# Patient Record
Sex: Male | Born: 1940 | Race: White | Hispanic: No | Marital: Married | Smoking: Former smoker
Health system: Southern US, Community
[De-identification: ages and names within clinical notes are randomized; demographics above are authoritative.]

## PROBLEM LIST (undated history)

## (undated) ENCOUNTER — Ambulatory Visit

## (undated) ENCOUNTER — Ambulatory Visit: Admission: EM | Discharge status: Absent without leave | Payer: Medicare HMO

## (undated) DIAGNOSIS — J301 Allergic rhinitis due to pollen: Secondary | ICD-10-CM

## (undated) DIAGNOSIS — I499 Cardiac arrhythmia, unspecified: Secondary | ICD-10-CM

## (undated) DIAGNOSIS — E785 Hyperlipidemia, unspecified: Secondary | ICD-10-CM

## (undated) DIAGNOSIS — T7840XA Allergy, unspecified, initial encounter: Secondary | ICD-10-CM

## (undated) DIAGNOSIS — F410 Panic disorder [episodic paroxysmal anxiety] without agoraphobia: Secondary | ICD-10-CM

## (undated) DIAGNOSIS — I8393 Asymptomatic varicose veins of bilateral lower extremities: Secondary | ICD-10-CM

## (undated) DIAGNOSIS — K219 Gastro-esophageal reflux disease without esophagitis: Secondary | ICD-10-CM

## (undated) DIAGNOSIS — N529 Male erectile dysfunction, unspecified: Secondary | ICD-10-CM

## (undated) DIAGNOSIS — I4891 Unspecified atrial fibrillation: Secondary | ICD-10-CM

## (undated) DIAGNOSIS — I491 Atrial premature depolarization: Secondary | ICD-10-CM

## (undated) DIAGNOSIS — K279 Peptic ulcer, site unspecified, unspecified as acute or chronic, without hemorrhage or perforation: Secondary | ICD-10-CM

## (undated) DIAGNOSIS — C801 Malignant (primary) neoplasm, unspecified: Secondary | ICD-10-CM

## (undated) DIAGNOSIS — R079 Chest pain, unspecified: Secondary | ICD-10-CM

## (undated) DIAGNOSIS — H269 Unspecified cataract: Secondary | ICD-10-CM

## (undated) HISTORY — DX: Allergy, unspecified, initial encounter: T78.40XA

## (undated) HISTORY — DX: Allergic rhinitis due to pollen: J30.1

## (undated) HISTORY — DX: Unspecified cataract: H26.9

## (undated) HISTORY — DX: Panic disorder (episodic paroxysmal anxiety): F41.0

## (undated) HISTORY — DX: Atrial premature depolarization: I49.1

## (undated) HISTORY — DX: Hyperlipidemia, unspecified: E78.5

## (undated) HISTORY — DX: Peptic ulcer, site unspecified, unspecified as acute or chronic, without hemorrhage or perforation: K27.9

## (undated) HISTORY — PX: EYE SURGERY: SHX253

## (undated) HISTORY — PX: NASAL SEPTUM SURGERY: SHX37

## (undated) HISTORY — PX: HERNIA REPAIR: SHX51

## (undated) HISTORY — DX: Unspecified atrial fibrillation: I48.91

## (undated) HISTORY — DX: Malignant (primary) neoplasm, unspecified: C80.1

## (undated) HISTORY — DX: Asymptomatic varicose veins of bilateral lower extremities: I83.93

## (undated) HISTORY — DX: Male erectile dysfunction, unspecified: N52.9

## (undated) HISTORY — DX: Gastro-esophageal reflux disease without esophagitis: K21.9

## (undated) HISTORY — DX: Cardiac arrhythmia, unspecified: I49.9

## (undated) HISTORY — DX: Chest pain, unspecified: R07.9

---

## 2016-10-18 DIAGNOSIS — I4891 Unspecified atrial fibrillation: Secondary | ICD-10-CM | POA: Diagnosis not present

## 2016-10-18 DIAGNOSIS — R079 Chest pain, unspecified: Secondary | ICD-10-CM | POA: Diagnosis not present

## 2016-10-25 DIAGNOSIS — I4891 Unspecified atrial fibrillation: Secondary | ICD-10-CM

## 2016-10-25 DIAGNOSIS — R079 Chest pain, unspecified: Secondary | ICD-10-CM

## 2016-10-25 HISTORY — DX: Chest pain, unspecified: R07.9

## 2016-10-25 HISTORY — DX: Unspecified atrial fibrillation: I48.91

## 2016-10-26 ENCOUNTER — Institutional Professional Consult (permissible substitution): Payer: Self-pay | Admitting: Cardiology

## 2016-11-03 ENCOUNTER — Encounter: Payer: Self-pay | Admitting: Cardiology

## 2016-11-03 ENCOUNTER — Encounter (INDEPENDENT_AMBULATORY_CARE_PROVIDER_SITE_OTHER): Payer: Self-pay

## 2016-11-03 ENCOUNTER — Ambulatory Visit (INDEPENDENT_AMBULATORY_CARE_PROVIDER_SITE_OTHER): Payer: Medicare Other | Admitting: Cardiology

## 2016-11-03 VITALS — BP 120/78 | HR 55 | Ht 70.5 in | Wt 205.8 lb

## 2016-11-03 DIAGNOSIS — R079 Chest pain, unspecified: Secondary | ICD-10-CM | POA: Diagnosis not present

## 2016-11-03 DIAGNOSIS — R002 Palpitations: Secondary | ICD-10-CM

## 2016-11-03 MED ORDER — PROPRANOLOL HCL 20 MG PO TABS: 20.0000 mg | ORAL_TABLET | Freq: Two times a day (BID) | ORAL | 11 refills | Status: DC

## 2016-11-03 NOTE — Addendum Note (Signed)
Addended by: Stanton Kidney on: 11/03/2016 10:05 AM   Modules accepted: Orders

## 2016-11-03 NOTE — Progress Notes (Signed)
Electrophysiology Office Note   Date:  11/03/2016   ID:  Gerald Shelton, DOB [birth year], MRN [id number]  PCP:  Orpah Melter, MD  Primary Electrophysiologist:  Constance Haw, MD    Chief Complaint  Patient presents with  . Advice Only    AFib     History of Present Illness: Gerald Shelton is a 76 y.o. male who presents today for electrophysiology evaluation.   He has a history of hyperlipidemia. He presented to his primary physician's office with episodes of chest pain. The episodes last from 5 minutes to one hour. He says it is possible that his episodes are associated with exertion. He was working in his yard over the weekend and says that he had chest pain after work. At other times his chest discomfort is not associated with any exertion. He says that it is a dull ache in the right side of his chest. He says it feels like potentially a pulled muscle. He otherwise does get episodes of palpitations. They occur most often when he is waking up from sleep. He has a known history of APCs, but he feels like these palpitations are different. They last a few minutes at a time and are associated with rapid heart rate. His chest pain is not necessarily associated with shortness of breath.   Today, he denies symptoms of shortness of breath, orthopnea, PND, lower extremity edema, claudication, dizziness, presyncope, syncope, bleeding, or neurologic sequela. The patient is tolerating medications without difficulties and is otherwise without complaint today.    Past Medical History:  Diagnosis Date  . Anxiety attack   . Atrial premature complexes   . Cancer ([place])    SKIN, ON FACE  . ED (erectile dysfunction)   . GERD (gastroesophageal reflux disease)   . Hay fever   . Hyperlipidemia   . Irregular heartbeat   . PUD (peptic ulcer disease)    History reviewed. No pertinent surgical history.   Current Outpatient Prescriptions  Medication Sig Dispense Refill  . aspirin EC 81 MG tablet  Take 81 mg by mouth daily.    . Multiple Vitamins-Minerals (MULTI FOR HIM 50+) TABS Take by mouth.    Marland Kitchen omeprazole (PRILOSEC) 40 MG capsule Take 40 mg by mouth. EVERY FOUR DAYS    . propranolol (INDERAL) 20 MG tablet Take 1 tablet (20 mg total) by mouth 2 (two) times daily. 60 tablet 11   No current facility-administered medications for this visit.     Allergies:   Augmentin [amoxicillin-pot clavulanate] and Simvastatin   Social History:  The patient  reports that he quit smoking about 18 years ago. He has never used smokeless tobacco. He reports that he drinks alcohol. He reports that he does not use drugs.   Family History:  The patient's family history includes Dementia in his mother; Depression in his mother; Diabetes in his son; Parkinson's disease in his mother.    ROS:  Please see the history of present illness.   Otherwise, review of systems is positive for chest pain, palpitations, anxiety.   All other systems are reviewed and negative.    PHYSICAL EXAM: VS:  BP 120/78   Pulse (!) 55   Ht 5' 10.5" (1.791 m)   Wt 205 lb 12.8 oz (93.4 kg)   BMI 29.11 kg/m  , BMI Body mass index is 29.11 kg/m. GEN: Well nourished, well developed, in no acute distress  HEENT: normal  Neck: no JVD, carotid bruits, or masses Cardiac: iRRR; no murmurs, rubs,  or gallops,no edema  Respiratory:  clear to auscultation bilaterally, normal work of breathing GI: soft, nontender, nondistended, + BS MS: no deformity or atrophy  Skin: warm and dry Neuro:  Strength and sensation are intact Psych: euthymic mood, full affect  EKG:  EKG is ordered today. Personal review of the ekg ordered shows sinus rhythm, with APCs  Recent Labs: No results found for requested labs within last 8760 hours.    Lipid Panel  No results found for: CHOL, TRIG, HDL, CHOLHDL, VLDL, LDLCALC, LDLDIRECT   Wt Readings from Last 3 Encounters:  11/03/16 205 lb 12.8 oz (93.4 kg)      Other studies Reviewed: Additional  studies/ records that were reviewed today include: PCP notes   ASSESSMENT AND PLAN:  1.  Chest pain: Feeling well today, but does have episodes of chest pain. It is unclear whether or not this is due to coronary disease. Due to that, we'll order a rest stress Myoview to further determine if he does have episodes of coronary artery disease.  2. 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 EKGs that are available today show sinus rhythm with APCs, and a EKG of sinus rhythm from his primary physician. Due to his episodic palpitations, we'll order a 48 hour monitor.  Current medicines are reviewed at length with the patient today.   The patient does not have concerns regarding his medicines.  The following changes were made today:  none  Labs/ tests ordered today include:  Orders Placed This Encounter  Procedures  . Myocardial Perfusion Imaging  . Holter monitor - 48 hour  . EKG 12-Lead     Disposition:   FU with Innocence Schlotzhauer 1 months  Signed, Breauna Mazzeo Meredith Leeds, MD  11/03/2016 9:58 AM     [facility] [address] [place] [address] [phone] (office) [phone] (fax)

## 2016-11-03 NOTE — Patient Instructions (Addendum)
Medication Instructions:   Your physician has recommended you make the following change in your medication:  1) INCREASE Propranolol 20 mg twice a day  --- If you need a refill on your cardiac medications before your next appointment, please call your pharmacy. ---  Labwork:  None ordered  Testing/Procedures: Your physician has recommended that you wear a 48 hour holter monitor. Holter monitors are medical devices that record the heart's electrical activity. Doctors most often use these monitors to diagnose arrhythmias. Arrhythmias are problems with the speed or rhythm of the heartbeat. The monitor is a small, portable device. You can wear one while you do your normal daily activities. This is usually used to diagnose what is causing palpitations/syncope (passing out).  Your physician has requested that you have an exercise tolerance test. For further information please visit [email or web address]. Please also follow instruction sheet, as given.  Follow-Up:  Your physician recommends that you schedule a follow-up appointment in: 1 month with Dr. Curt Bears  Thank you for choosing [facility]!!   Trinidad Curet, RN [phone]  Any Other Special Instructions Will Be Listed Below (If Applicable).  Propranolol tablets What is this medicine? PROPRANOLOL (proe PRAN oh lole) is a beta-blocker. Beta-blockers reduce the workload on the heart and help it to beat more regularly. This medicine is used to treat high blood pressure, to control irregular heart rhythms (arrhythmias) and to relieve chest pain caused by angina. It may also be helpful after a heart attack. This medicine is also used to prevent migraine headaches, relieve uncontrollable shaking (tremors), and help certain problems related to the thyroid gland and adrenal gland. This medicine may be used for other purposes; ask your health care provider or pharmacist if you have questions. COMMON BRAND NAME(S): Inderal What should I tell  my health care provider before I take this medicine? They need to know if you have any of these conditions: -circulation problems or blood vessel disease -diabetes -history of heart attack or heart disease, vasospastic angina -kidney disease -liver disease -lung or breathing disease, like asthma or emphysema -pheochromocytoma -slow heart rate -thyroid disease -an unusual or allergic reaction to propranolol, other beta-blockers, medicines, foods, dyes, or preservatives -pregnant or trying to get pregnant -breast-feeding How should I use this medicine? Take this medicine by mouth with a glass of water. Follow the directions on the prescription label. Take your doses at regular intervals. Do not take your medicine more often than directed. Do not stop taking except on your the advice of your doctor or health care professional. Talk to your pediatrician regarding the use of this medicine in children. Special care may be needed. Overdosage: If you think you have taken too much of this medicine contact a poison control center or emergency room at once. NOTE: This medicine is only for you. Do not share this medicine with others. What if I miss a dose? If you miss a dose, take it as soon as you can. If it is almost time for your next dose, take only that dose. Do not take double or extra doses. What may interact with this medicine? Do not take this medicine with any of the following medications: -feverfew -phenothiazines like chlorpromazine, mesoridazine, prochlorperazine, thioridazine This medicine may also interact with the following medications: -aluminum hydroxide gel -antipyrine -antiviral medicines for HIV or AIDS -barbiturates like phenobarbital -certain medicines for blood pressure, heart disease, irregular heart beat -cimetidine -ciprofloxacin -diazepam -fluconazole -haloperidol -isoniazid -medicines for cholesterol like cholestyramine or colestipol -medicines for mental  depression -medicines for migraine headache like almotriptan, eletriptan, frovatriptan, naratriptan, rizatriptan, sumatriptan, zolmitriptan -NSAIDs, medicines for pain and inflammation, like ibuprofen or naproxen -phenytoin -rifampin -teniposide -theophylline -thyroid medicines -tolbutamide -warfarin -zileuton This list may not describe all possible interactions. Give your health care provider a list of all the medicines, herbs, non-prescription drugs, or dietary supplements you use. Also tell them if you smoke, drink alcohol, or use illegal drugs. Some items may interact with your medicine. What should I watch for while using this medicine? Visit your doctor or health care professional for regular check ups. Check your blood pressure and pulse rate regularly. Ask your health care professional what your blood pressure and pulse rate should be, and when you should contact them. You may get drowsy or dizzy. Do not drive, use machinery, or do anything that needs mental alertness until you know how this drug affects you. Do not stand or sit up quickly, especially if you are an older patient. This reduces the risk of dizzy or fainting spells. Alcohol can make you more drowsy and dizzy. Avoid alcoholic drinks. This medicine can affect blood sugar levels. If you have diabetes, check with your doctor or health care professional before you change your diet or the dose of your diabetic medicine. Do not treat yourself for coughs, colds, or pain while you are taking this medicine without asking your doctor or health care professional for advice. Some ingredients may increase your blood pressure. What side effects may I notice from receiving this medicine? Side effects that you should report to your doctor or health care professional as soon as possible: -allergic reactions like skin rash, itching or hives, swelling of the face, lips, or tongue -breathing problems -changes in blood sugar -cold hands or  feet -difficulty sleeping, nightmares -dry peeling skin -hallucinations -muscle cramps or weakness -slow heart rate -swelling of the legs and ankles -vomiting Side effects that usually do not require medical attention (report to your doctor or health care professional if they continue or are bothersome): -change in sex drive or performance -diarrhea -dry sore eyes -hair loss -nausea -weak or tired This list may not describe all possible side effects. Call your doctor for medical advice about side effects. You may report side effects to FDA at 1-800-FDA-1088. Where should I keep my medicine? Keep out of the reach of children. Store at room temperature between 15 and 30 degrees C (59 and 86 degrees F). Protect from light. Throw away any unused medicine after the expiration date. NOTE: This sheet is a summary. It may not cover all possible information. If you have questions about this medicine, talk to your doctor, pharmacist, or health care provider.  2017 Elsevier/Gold Standard (2013-05-31 14:51:53)   Pharmacologic Stress Electrocardiogram A pharmacologic stress electrocardiogram is a heart (cardiac) test that uses nuclear imaging to evaluate the blood supply to your heart. This test may also be called a pharmacologic stress electrocardiography. Pharmacologic means that a medicine is used to increase your heart rate and blood pressure.  This stress test is done to find areas of poor blood flow to the heart by determining the extent of coronary artery disease (CAD). Some people exercise on a treadmill, which naturally increases the blood flow to the heart. For those people unable to exercise on a treadmill, a medicine is used. This medicine stimulates your heart and will cause your heart to beat harder and more quickly, as if you were exercising.  Pharmacologic stress tests can help determine:  The adequacy of  blood flow to your heart during increased levels of activity in order to clear  you for discharge home.  The extent of coronary artery blockage caused by CAD.  Your prognosis if you have suffered a heart attack.  The effectiveness of cardiac procedures done, such as an angioplasty, which can increase the circulation in your coronary arteries.  Causes of chest pain or pressure. LET [facility] CARE PROVIDER KNOW ABOUT:  Any allergies you have.  All medicines you are taking, including vitamins, herbs, eye drops, creams, and over-the-counter medicines.  Previous problems you or members of your family have had with the use of anesthetics.  Any blood disorders you have.  Previous surgeries you have had.  Medical conditions you have.  Possibility of pregnancy, if this applies.  If you are currently breastfeeding. RISKS AND COMPLICATIONS Generally, this is a safe procedure. However, as with any procedure, complications can occur. Possible complications include:  You develop pain or pressure in the following areas:  Chest.  Jaw or neck.  Between your shoulder blades.  Radiating down your left arm.  Headache.  Dizziness or light-headedness.  Shortness of breath.  Increased or irregular heartbeat.  Low blood pressure.  Nausea or vomiting.  Flushing.  Redness going up the arm and slight pain during injection of medicine.  Heart attack (rare). BEFORE THE PROCEDURE   Avoid all forms of caffeine for 24 hours before your test or as directed by your health care provider. This includes coffee, tea (even decaffeinated tea), caffeinated sodas, chocolate, cocoa, and certain pain medicines.  Follow your health care provider's instructions regarding eating and drinking before the test.  Take your medicines as directed at regular times with water unless instructed otherwise. Exceptions may include:  If you have diabetes, ask how you are to take your insulin or pills. It is common to adjust insulin dosing the morning of the test.  If you are taking  beta-blocker medicines, it is important to talk to your health care provider about these medicines well before the date of your test. Taking beta-blocker medicines may interfere with the test. In some cases, these medicines need to be changed or stopped 24 hours or more before the test.  If you wear a nitroglycerin patch, it may need to be removed prior to the test. Ask your health care provider if the patch should be removed before the test.  If you use an inhaler for any breathing condition, bring it with you to the test.  If you are an outpatient, bring a snack so you can eat right after the stress phase of the test.  Do not smoke for 4 hours prior to the test or as directed by your health care provider.  Do not apply lotions, powders, creams, or oils on your chest prior to the test.  Wear comfortable shoes and clothing. Let your health care provider know if you were unable to complete or follow the preparations for your test. PROCEDURE   Multiple patches (electrodes) will be put on your chest. If needed, small areas of your chest may be shaved to get better contact with the electrodes. Once the electrodes are attached to your body, multiple wires will be attached to the electrodes, and your heart rate will be monitored.  An IV access will be started. A nuclear trace (isotope) is given. The isotope may be given intravenously, or it may be swallowed. Nuclear refers to several types of radioactive isotopes, and the nuclear isotope lights up the arteries  so that the nuclear images are clear. The isotope is absorbed by your body. This results in low radiation exposure.  A resting nuclear image is taken to show how your heart functions at rest.  A medicine is given through the IV access.  A second scan is done about 1 hour after the medicine injection and determines how your heart functions under stress.  During this stress phase, you will be connected to an electrocardiogram machine. Your  blood pressure and oxygen levels will be monitored. AFTER THE PROCEDURE   Your heart rate and blood pressure will be monitored after the test.  You may return to your normal schedule, including diet,activities, and medicines, unless your health care provider tells you otherwise. This information is not intended to replace advice given to you by your health care provider. Make sure you discuss any questions you have with your health care provider. Document Released: 02/12/2009 Document Revised: 10/01/2013 Document Reviewed: 06/03/2013 Elsevier Interactive Patient Education  2017 [employer].

## 2016-11-09 ENCOUNTER — Ambulatory Visit (INDEPENDENT_AMBULATORY_CARE_PROVIDER_SITE_OTHER): Payer: Medicare Other

## 2016-11-09 ENCOUNTER — Ambulatory Visit: Payer: Medicare Other

## 2016-11-09 ENCOUNTER — Telehealth: Payer: Self-pay | Admitting: *Deleted

## 2016-11-09 DIAGNOSIS — R079 Chest pain, unspecified: Secondary | ICD-10-CM

## 2016-11-09 DIAGNOSIS — R002 Palpitations: Secondary | ICD-10-CM | POA: Diagnosis not present

## 2016-11-09 NOTE — Telephone Encounter (Signed)
Patient seen on 1/25 - holter monitor (palpitations) and myoview (chest pain) ordered.   However, GXT ordered/scheduled, not a myoview.   Pt coming in today for holter monitor -- Gerald Shelton, treadmill room, will have patient schedule myoview testing before leaving today.

## 2016-11-10 ENCOUNTER — Telehealth (HOSPITAL_COMMUNITY): Payer: Self-pay | Admitting: *Deleted

## 2016-11-10 ENCOUNTER — Encounter (HOSPITAL_COMMUNITY): Payer: Medicare Other

## 2016-11-10 NOTE — Telephone Encounter (Signed)
Patient given detailed instructions per Myocardial Perfusion Study Information Sheet for the test on 11/14/16. Patient notified to arrive 15 minutes early and that it is imperative to arrive on time for appointment to keep from having the test rescheduled.  If you need to cancel or reschedule your appointment, please call the office within 24 hours of your appointment. Failure to do so may result in a cancellation of your appointment, and a $50 no show fee. Patient verbalized understanding. Kirstie Peri

## 2016-11-14 ENCOUNTER — Ambulatory Visit (HOSPITAL_COMMUNITY): Payer: Medicare Other | Attending: Cardiology

## 2016-11-14 DIAGNOSIS — R079 Chest pain, unspecified: Secondary | ICD-10-CM | POA: Diagnosis not present

## 2016-11-14 LAB — MYOCARDIAL PERFUSION IMAGING
CHL CUP NUCLEAR SSS: 2
LV sys vol: 39 mL
LVDIAVOL: 92 mL (ref 62–150)
Peak HR: 85 {beats}/min
RATE: 0.35
Rest HR: 57 {beats}/min
SDS: 1
SRS: 1
TID: 0.89

## 2016-11-14 MED ORDER — TECHNETIUM TC 99M TETROFOSMIN IV KIT
33.0000 | PACK | Freq: Once | INTRAVENOUS | Status: AC | PRN
  Administered 2016-11-14: 33 via INTRAVENOUS
  Filled 2016-11-14: qty 33

## 2016-11-14 MED ORDER — TECHNETIUM TC 99M TETROFOSMIN IV KIT
10.8000 | PACK | Freq: Once | INTRAVENOUS | Status: AC | PRN
  Administered 2016-11-14: 10.8 via INTRAVENOUS
  Filled 2016-11-14: qty 11

## 2016-11-14 MED ORDER — REGADENOSON 0.4 MG/5ML IV SOLN
0.4000 mg | Freq: Once | INTRAVENOUS | Status: AC
  Administered 2016-11-14: 0.4 mg via INTRAVENOUS

## 2016-11-29 ENCOUNTER — Ambulatory Visit (INDEPENDENT_AMBULATORY_CARE_PROVIDER_SITE_OTHER): Payer: Medicare Other | Admitting: Cardiology

## 2016-11-29 ENCOUNTER — Encounter: Payer: Self-pay | Admitting: Cardiology

## 2016-11-29 VITALS — BP 120/76 | HR 68 | Ht 70.5 in | Wt 205.2 lb

## 2016-11-29 DIAGNOSIS — R002 Palpitations: Secondary | ICD-10-CM | POA: Diagnosis not present

## 2016-11-29 DIAGNOSIS — I491 Atrial premature depolarization: Secondary | ICD-10-CM

## 2016-11-29 MED ORDER — PROPRANOLOL HCL 20 MG PO TABS: 20.0000 mg | ORAL_TABLET | Freq: Two times a day (BID) | ORAL | 6 refills | Status: DC

## 2016-11-29 NOTE — Progress Notes (Signed)
Electrophysiology Office Note   Date:  11/29/2016   ID:  Gerald Shelton, DOB 1941-08-24, MRN [id number]  PCP:  Orpah Melter, MD  Primary Electrophysiologist:  Constance Haw, MD    Chief Complaint  Patient presents with  . Follow-up    Palpitations     History of Present Illness: Gerald Shelton is a 76 y.o. male who presents today for electrophysiology evaluation.   He has a history of hyperlipidemia. He initially presented to the office after an episode of chest pain as well as palpitations. He had a nuclear stress test that was low risk with a normal ejection fraction. He wore a 48 hour monitor that showed 5% APCs but no other arrhythmia. He says that he has continued to have the APCs, but they are not significantly worrisome. He also continues to have his same chest pain. The pain is in both the left and right side of his chest, depending on the episode. He says that it is not associated with exertion.   Today, he denies symptoms of shortness of breath, orthopnea, PND, lower extremity edema, claudication, dizziness, presyncope, syncope, bleeding, or neurologic sequela. The patient is tolerating medications without difficulties and is otherwise without complaint today.    Past Medical History:  Diagnosis Date  . Anxiety attack   . Atrial premature complexes   . Cancer ([place])    SKIN, ON FACE  . ED (erectile dysfunction)   . GERD (gastroesophageal reflux disease)   . Hay fever   . Hyperlipidemia   . Irregular heartbeat   . PUD (peptic ulcer disease)    No past surgical history on file.   Current Outpatient Prescriptions  Medication Sig Dispense Refill  . aspirin EC 81 MG tablet Take 81 mg by mouth daily.    . Multiple Vitamins-Minerals (MULTI FOR HIM 50+) TABS Take by mouth.    Marland Kitchen omeprazole (PRILOSEC) 40 MG capsule Take 40 mg by mouth. EVERY FOUR DAYS    . propranolol (INDERAL) 20 MG tablet Take 1 tablet (20 mg total) by mouth 2 (two) times daily. 60 tablet 6   No  current facility-administered medications for this visit.     Allergies:   Augmentin [amoxicillin-pot clavulanate] and Simvastatin   Social History:  The patient  reports that he quit smoking about 18 years ago. He has never used smokeless tobacco. He reports that he drinks alcohol. He reports that he does not use drugs.   Family History:  The patient's family history includes Dementia in his mother; Depression in his mother; Diabetes in his son; Parkinson's disease in his mother.    ROS:  Please see the history of present illness.   Otherwise, review of systems is positive for chest pain, palpitations.   All other systems are reviewed and negative.    PHYSICAL EXAM: VS:  BP 120/76   Pulse 68   Ht 5' 10.5" (1.791 m)   Wt 205 lb 3.2 oz (93.1 kg)   BMI 29.03 kg/m  , BMI Body mass index is 29.03 kg/m. GEN: Well nourished, well developed, in no acute distress  HEENT: normal  Neck: no JVD, carotid bruits, or masses Cardiac: RRR; no murmurs, rubs, or gallops,no edema  Respiratory:  clear to auscultation bilaterally, normal work of breathing GI: soft, nontender, nondistended, + BS MS: no deformity or atrophy  Skin: warm and dry Neuro:  Strength and sensation are intact Psych: euthymic mood, full affect  EKG:  EKG is not ordered today. Personal review of  the ekg ordered 11/03/16 shows sinus rhythm, with APCs  Recent Labs: No results found for requested labs within last 8760 hours.    Lipid Panel  No results found for: CHOL, TRIG, HDL, CHOLHDL, VLDL, LDLCALC, LDLDIRECT   Wt Readings from Last 3 Encounters:  11/29/16 205 lb 3.2 oz (93.1 kg)  11/14/16 205 lb (93 kg)  11/03/16 205 lb 12.8 oz (93.4 kg)      Other studies Reviewed: Additional studies/ records that were reviewed today include: Myoview 11/14/16  Nuclear stress EF: 57%.  There was no ST segment deviation noted during stress.  The study is normal.  This is a low risk study.  The left ventricular ejection  fraction is normal (55-65%).   Normal pharmacologic nuclear stress test with no evidence for prior infarct or ischemia.  Holter 11/09/16 Minimum HR: 40 BPM at 11:54:42 AM Maximum HR: 101 BPM at 5:09:43 PM(2) Average HR: 62 BPM Rare PVCs 5.14% APCs Symptoms of rapid heart beats associated with sinus rhythm, rates 55-85 and occasional APCs   ASSESSMENT AND PLAN:  1.  Chest pain: No current chest pain. Had a nuclear stress test that showed no evidence of ischemia or infarction and was low risk. It is unlikely, therefore, that his chest pain is due to cardiac causes. He does have a history of GERD, and is unclear whether or not he could have esophageal spasm.  2. Palpitations: Found to have up to 5% APCs on his cardiac monitor. He is on propranolol. I have discussed with him that these are not necessarily dangerous and they do not pose him risk of heart attack, stroke, or sudden death. He feels well with the reassurance and Gerald Shelton continue his propranolol.  Current medicines are reviewed at length with the patient today.   The patient does not have concerns regarding his medicines.  The following changes were made today:  none  Labs/ tests ordered today include:  No orders of the defined types were placed in this encounter.    Disposition:   FU with Belina Mandile 6  months  Signed, Rashonda Warrior Meredith Leeds, MD  11/29/2016 10:53 AM     [facility] [address] [place] [address] [phone] (office) [phone] (fax)

## 2016-11-29 NOTE — Patient Instructions (Addendum)
Medication Instructions:  Your physician recommends that you continue on your current medications as directed. Please refer to the Current Medication list given to you today.   Labwork: None Ordered   Testing/Procedures: None Ordered   Follow-Up: Your physician wants you to follow-up in: 6 month with Dr. Curt Bears. You will receive a reminder letter in the mail two months in advance. If you don't receive a letter, please call our office to schedule the follow-up appointment.     Any Other Special Instructions Will Be Listed Below (If Applicable).     If you need a refill on your cardiac medications before your next appointment, please call your pharmacy.

## 2016-12-13 ENCOUNTER — Other Ambulatory Visit: Payer: Self-pay | Admitting: Cardiology

## 2016-12-13 MED ORDER — PROPRANOLOL HCL 20 MG PO TABS: 20.0000 mg | ORAL_TABLET | Freq: Two times a day (BID) | ORAL | 3 refills | Status: DC

## 2016-12-29 DIAGNOSIS — D225 Melanocytic nevi of trunk: Secondary | ICD-10-CM | POA: Diagnosis not present

## 2016-12-29 DIAGNOSIS — L814 Other melanin hyperpigmentation: Secondary | ICD-10-CM | POA: Diagnosis not present

## 2016-12-29 DIAGNOSIS — L821 Other seborrheic keratosis: Secondary | ICD-10-CM | POA: Diagnosis not present

## 2016-12-29 DIAGNOSIS — D1801 Hemangioma of skin and subcutaneous tissue: Secondary | ICD-10-CM | POA: Diagnosis not present

## 2016-12-29 DIAGNOSIS — L570 Actinic keratosis: Secondary | ICD-10-CM | POA: Diagnosis not present

## 2017-02-28 ENCOUNTER — Telehealth: Payer: Self-pay | Admitting: Cardiology

## 2017-02-28 NOTE — Telephone Encounter (Signed)
New Message     Pt had a heart monitor done earlier this winter and he would like Dr Curt Bears to review it , he thinks something was missed. There was specific times when things were happening when he was wearing the monitor that he would like to discuss with Dr Curt Bears

## 2017-02-28 NOTE — Telephone Encounter (Signed)
Patient called to request Dr. Curt Bears take another looks at his holter results from [place].  He states when he wakes up (either during the night or in the morning), he feels like his heart is racing like it did when he had the monitor. This is not new, he would just like an explanation. No symptoms accompany the fast heart rate and the episodes last about 10-15 minutes.  He does not have follow-up until August and would like possible explanations before that time.   To Dr. Curt Bears for review.

## 2017-03-01 NOTE — Telephone Encounter (Signed)
Sinus rhythm with APCs seen, 5.3%. No other arrhythmia. If continued symptoms, can return to clinic to discuss management of APCs.

## 2017-03-02 NOTE — Telephone Encounter (Signed)
Informed pt of Dr. Curt Bears response.  We discussed APCs and why he may be feeling them more in morning/evening w/ rest.  Pt is not concerned, denies symptoms at this time.  He thanks me for calling and discussing this with him and explaining APCs in more detail. Pt will call office if he feels he needs to be seen sooner than scheduled f/u in August.

## 2017-04-24 DIAGNOSIS — R202 Paresthesia of skin: Secondary | ICD-10-CM | POA: Diagnosis not present

## 2017-04-24 DIAGNOSIS — R7303 Prediabetes: Secondary | ICD-10-CM | POA: Diagnosis not present

## 2017-04-24 DIAGNOSIS — M79605 Pain in left leg: Secondary | ICD-10-CM | POA: Diagnosis not present

## 2017-04-24 DIAGNOSIS — R200 Anesthesia of skin: Secondary | ICD-10-CM | POA: Diagnosis not present

## 2017-05-01 DIAGNOSIS — M21962 Unspecified acquired deformity of left lower leg: Secondary | ICD-10-CM | POA: Diagnosis not present

## 2017-05-01 DIAGNOSIS — M21961 Unspecified acquired deformity of right lower leg: Secondary | ICD-10-CM | POA: Diagnosis not present

## 2017-05-01 DIAGNOSIS — M7752 Other enthesopathy of left foot: Secondary | ICD-10-CM | POA: Diagnosis not present

## 2017-05-01 DIAGNOSIS — M205X2 Other deformities of toe(s) (acquired), left foot: Secondary | ICD-10-CM | POA: Diagnosis not present

## 2017-05-03 DIAGNOSIS — M5431 Sciatica, right side: Secondary | ICD-10-CM | POA: Diagnosis not present

## 2017-05-03 DIAGNOSIS — G8929 Other chronic pain: Secondary | ICD-10-CM | POA: Diagnosis not present

## 2017-05-03 DIAGNOSIS — M5441 Lumbago with sciatica, right side: Secondary | ICD-10-CM | POA: Diagnosis not present

## 2017-05-03 DIAGNOSIS — M5442 Lumbago with sciatica, left side: Secondary | ICD-10-CM | POA: Diagnosis not present

## 2017-05-03 DIAGNOSIS — M5432 Sciatica, left side: Secondary | ICD-10-CM | POA: Diagnosis not present

## 2017-05-15 ENCOUNTER — Encounter: Payer: Self-pay | Admitting: Cardiology

## 2017-05-31 ENCOUNTER — Encounter: Payer: Self-pay | Admitting: Cardiology

## 2017-05-31 ENCOUNTER — Ambulatory Visit (INDEPENDENT_AMBULATORY_CARE_PROVIDER_SITE_OTHER): Payer: Medicare Other | Admitting: Cardiology

## 2017-05-31 VITALS — BP 130/84 | HR 58 | Ht 70.5 in | Wt 201.6 lb

## 2017-05-31 DIAGNOSIS — I491 Atrial premature depolarization: Secondary | ICD-10-CM | POA: Diagnosis not present

## 2017-05-31 NOTE — Addendum Note (Signed)
Addended by: Stanton Kidney on: 05/31/2017 10:04 AM   Modules accepted: Orders

## 2017-05-31 NOTE — Patient Instructions (Signed)
Medication Instructions:  Your physician has recommended you make the following change in your medication:  1. STOP Propranolol  If you need a refill on your cardiac medications before your next appointment, please call your pharmacy.   Labwork: None ordered  Testing/Procedures: None ordered  Follow-Up: Your physician wants you to follow-up in: 1 year with Dr. Curt Bears.  You will receive a reminder letter in the mail two months in advance. If you don't receive a letter, please call our office to schedule the follow-up appointment.  Thank you for choosing [facility]!!   Trinidad Curet, RN [phone]  Any Other Special Instructions Will Be Listed Below (If Applicable).

## 2017-05-31 NOTE — Progress Notes (Signed)
Electrophysiology Office Note   Date:  05/31/2017   ID:  Syncere, Eble [birth year], MRN [id number]  PCP:  Glenford Bayley, DO  Primary Electrophysiologist:  Constance Haw, MD    Chief Complaint  Patient presents with  . Follow-up    Palpitations/Atrial premature contractions     History of Present Illness: Gerald Shelton is a 76 y.o. male who presents today for electrophysiology evaluation.   He has a history of hyperlipidemia. He initially presented to the office after an episode of chest pain as well as palpitations. He had a nuclear stress test that was low risk with a normal ejection fraction. He wore a 48 hour monitor that showed 5% APCs but no other arrhythmia. He says that he has continued to have the APCs, but they are not significantly worrisome. He also continues to have his same chest pain. The pain is in both the left and right side of his chest, depending on the episode. He says that it is not associated with exertion.   Today, denies symptoms of chest pain, shortness of breath, orthopnea, PND, lower extremity edema, claudication, dizziness, presyncope, syncope, bleeding, or neurologic sequela. The patient is tolerating medications without difficulties and is otherwise without complaint today.  He is continuing to have episodes of palpitations. He does not feel like the propranolol has made a difference. His palpitations mainly occur when he wakes up first thing in the morning. He is not particularly bothered by his palpitations. He wishes to stop the propranolol.   Past Medical History:  Diagnosis Date  . Anxiety attack   . Atrial premature complexes   . Cancer ([place])    SKIN, ON FACE  . ED (erectile dysfunction)   . GERD (gastroesophageal reflux disease)   . Hay fever   . Hyperlipidemia   . Irregular heartbeat   . PUD (peptic ulcer disease)    No past surgical history on file.   Current Outpatient Prescriptions  Medication Sig Dispense Refill  . Multiple  Vitamins-Minerals (MULTI FOR HIM 50+) TABS Take by mouth.    Marland Kitchen omeprazole (PRILOSEC) 40 MG capsule Take 40 mg by mouth every other day.     . propranolol (INDERAL) 20 MG tablet Take 1 tablet (20 mg total) by mouth 2 (two) times daily. 180 tablet 3   No current facility-administered medications for this visit.     Allergies:   Augmentin [amoxicillin-pot clavulanate] and Simvastatin   Social History:  The patient  reports that he quit smoking about 18 years ago. He has never used smokeless tobacco. He reports that he drinks alcohol. He reports that he does not use drugs.   Family History:  The patient's family history includes Dementia in his mother; Depression in his mother; Diabetes in his son; Parkinson's disease in his mother.    ROS:  Please see the history of present illness.   Otherwise, review of systems is positive for palpitations.   All other systems are reviewed and negative.   PHYSICAL EXAM: VS:  BP 130/84   Pulse (!) 58   Ht 5' 10.5" (1.791 m)   Wt 201 lb 9.6 oz (91.4 kg)   BMI 28.52 kg/m  , BMI Body mass index is 28.52 kg/m. GEN: Well nourished, well developed, in no acute distress  HEENT: normal  Neck: no JVD, carotid bruits, or masses Cardiac: iRRR; no murmurs, rubs, or gallops,no edema  Respiratory:  clear to auscultation bilaterally, normal work of breathing GI: soft, nontender, nondistended, +  BS MS: no deformity or atrophy  Skin: warm and dry Neuro:  Strength and sensation are intact Psych: euthymic mood, full affect  EKG:  EKG is ordered today. Personal review of the ekg ordered shows sinus rhythm, APCs, inferior Q waves   Recent Labs: No results found for requested labs within last 8760 hours.    Lipid Panel  No results found for: CHOL, TRIG, HDL, CHOLHDL, VLDL, LDLCALC, LDLDIRECT   Wt Readings from Last 3 Encounters:  05/31/17 201 lb 9.6 oz (91.4 kg)  11/29/16 205 lb 3.2 oz (93.1 kg)  11/14/16 205 lb (93 kg)      Other studies  Reviewed: Additional studies/ records that were reviewed today include: Myoview 11/14/16  Nuclear stress EF: 57%.  There was no ST segment deviation noted during stress.  The study is normal.  This is a low risk study.  The left ventricular ejection fraction is normal (55-65%).   Normal pharmacologic nuclear stress test with no evidence for prior infarct or ischemia.  Holter 11/09/16 Minimum HR: 40 BPM at 11:54:42 AM Maximum HR: 101 BPM at 5:09:43 PM(2) Average HR: 62 BPM Rare PVCs 5.14% APCs Symptoms of rapid heart beats associated with sinus rhythm, rates 55-85 and occasional APCs   ASSESSMENT AND PLAN:  1.  Chest pain: Chest pain is gone. Nuclear stress test without evidence of ischemia. No further workup.  2. Palpitations: Found to have 5% APCs on his most recent monitor. He was taking propranolol, but this has not helped with his symptoms. He understands that this is not put him at risk of heart attack, stroke, or syncope. He would like to try being off the propranolol to see if this makes a difference.  Current medicines are reviewed at length with the patient today.   The patient does not have concerns regarding his medicines.  The following changes were made today:  Stop propranolol  Labs/ tests ordered today include:  Orders Placed This Encounter  Procedures  . EKG 12-Lead     Disposition:   FU with Will Camnitz 12  months  Signed, Will Meredith Leeds, MD  05/31/2017 10:01 AM     [facility] [address] [place] [address] [phone] (office) [phone] (fax)

## 2017-06-01 DIAGNOSIS — Z0000 Encounter for general adult medical examination without abnormal findings: Secondary | ICD-10-CM | POA: Diagnosis not present

## 2017-06-01 DIAGNOSIS — M79671 Pain in right foot: Secondary | ICD-10-CM | POA: Diagnosis not present

## 2017-06-01 DIAGNOSIS — M79605 Pain in left leg: Secondary | ICD-10-CM | POA: Diagnosis not present

## 2017-06-01 DIAGNOSIS — E782 Mixed hyperlipidemia: Secondary | ICD-10-CM | POA: Diagnosis not present

## 2017-06-01 DIAGNOSIS — M79672 Pain in left foot: Secondary | ICD-10-CM | POA: Diagnosis not present

## 2017-06-01 DIAGNOSIS — R7303 Prediabetes: Secondary | ICD-10-CM | POA: Diagnosis not present

## 2017-06-01 DIAGNOSIS — K219 Gastro-esophageal reflux disease without esophagitis: Secondary | ICD-10-CM | POA: Diagnosis not present

## 2017-06-01 DIAGNOSIS — I491 Atrial premature depolarization: Secondary | ICD-10-CM | POA: Diagnosis not present

## 2017-06-01 DIAGNOSIS — M79604 Pain in right leg: Secondary | ICD-10-CM | POA: Diagnosis not present

## 2017-06-09 ENCOUNTER — Other Ambulatory Visit: Payer: Self-pay | Admitting: Family Medicine

## 2017-06-09 DIAGNOSIS — M79605 Pain in left leg: Secondary | ICD-10-CM

## 2017-06-13 ENCOUNTER — Ambulatory Visit (HOSPITAL_COMMUNITY)
Admission: RE | Admit: 2017-06-13 | Discharge: 2017-06-13 | Disposition: A | Discharge status: Home / Self Care | Payer: Medicare Other | Source: Ambulatory Visit | Attending: Vascular Surgery | Admitting: Vascular Surgery

## 2017-06-13 DIAGNOSIS — M79605 Pain in left leg: Secondary | ICD-10-CM

## 2017-06-14 ENCOUNTER — Ambulatory Visit (HOSPITAL_COMMUNITY)
Admission: RE | Admit: 2017-06-14 | Discharge: 2017-06-14 | Disposition: A | Discharge status: Home / Self Care | Payer: Medicare Other | Source: Ambulatory Visit | Attending: Vascular Surgery | Admitting: Vascular Surgery

## 2017-06-14 ENCOUNTER — Other Ambulatory Visit: Payer: Self-pay | Admitting: Family Medicine

## 2017-06-14 DIAGNOSIS — M79605 Pain in left leg: Secondary | ICD-10-CM | POA: Diagnosis not present

## 2017-06-26 ENCOUNTER — Encounter: Payer: Self-pay | Admitting: Vascular Surgery

## 2017-06-26 ENCOUNTER — Ambulatory Visit (INDEPENDENT_AMBULATORY_CARE_PROVIDER_SITE_OTHER): Payer: Medicare Other | Admitting: Vascular Surgery

## 2017-06-26 VITALS — BP 124/70 | HR 68 | Temp 97.6°F | Resp 16 | Ht 70.5 in | Wt 199.0 lb

## 2017-06-26 DIAGNOSIS — I8393 Asymptomatic varicose veins of bilateral lower extremities: Secondary | ICD-10-CM

## 2017-06-26 HISTORY — DX: Asymptomatic varicose veins of bilateral lower extremities: I83.93

## 2017-06-26 NOTE — Progress Notes (Signed)
Subjective:     Patient ID: Gerald Shelton, male   DOB: [birth year], 76 y.o.   MRN: [id number]  HPI Th for evaluation of possible venous disease. The patient's symptoms are primarily in both feet which consist of numbness and a tight sensation on the soles of his feet. He's had this for many years. He has no history of DVT, phlebitis stasis ulcers or bleeding. He denies swelling does not elastic compression stockings. He has seen an orthopedic surgeon in the past and has also seen a neurologist but I'm not certain exactly when those consultations occurred. He saw a podiatrist for a different type of problem in his left foot which is now resolved. His symptoms are present in all day and worsened by walking. He is able to ambulate 1 mile.   Past Medical History:  Diagnosis Date  . Anxiety attack   . Atrial premature complexes   . Cancer ([place])    SKIN, ON FACE  . ED (erectile dysfunction)   . GERD (gastroesophageal reflux disease)   . Hay fever   . Hyperlipidemia   . Irregular heartbeat   . PUD (peptic ulcer disease)     Social History  Substance Use Topics  . Smoking status: Former Smoker    Quit date: 10/25/1998  . Smokeless tobacco: Never Used  . Alcohol use Yes     Comment: RARE    Family History  Problem Relation Age of Onset  . Diabetes Son   . Parkinson's disease Mother   . Depression Mother   . Dementia Mother     Allergies  Allergen Reactions  . Augmentin [Amoxicillin-Pot Clavulanate] Other (See Comments)    GI UPSET  . Simvastatin Other (See Comments)    GI UPSET     Current Outpatient Prescriptions:  Marland Kitchen  Multiple Vitamins-Minerals (MULTI FOR HIM 50+) TABS, Take by mouth., Disp: , Rfl:  .  ranitidine (ZANTAC) 150 MG tablet, Take 150 mg by mouth 2 (two) times daily., Disp: , Rfl:  .  omeprazole (PRILOSEC) 40 MG capsule, Take 40 mg by mouth every other day. , Disp: , Rfl:   Vitals:   06/26/17 1002  BP: 124/70  Pulse: 68  Resp: 16  Temp: 97.6 F (36.4 C)  SpO2:  94%  Weight: 199 lb (90.3 kg)  Height: 5' 10.5" (1.791 m)    Body mass index is 28.15 kg/m.        76 year old male was referred by Dr.Le   Review of Systems Denies chest pain but does have a history of PACs. Denies dyspnea on exertion, PND, orthopnea, hemoptysis, claudication    Objective:   Physical Exam BP 124/70   Pulse 68   Temp 97.6 F (36.4 C)   Resp 16   Ht 5' 10.5" (1.791 m)   Wt 199 lb (90.3 kg)   SpO2 94%   BMI 28.15 kg/m     Gen.-alert and oriented x3 in no apparent distress HEENT normal for age Lungs no rhonchi or wheezing Cardiovascular regular rhythm no murmurs carotid pulses 3+ palpable no bruits audible Abdomen soft nontender no palpable masses Musculoskeletal free of  major deformities Skin clear -no rashes Neurologic normal Lower extremities 3+ femoral and dorsalis pedis pulses palpable bilaterally with no edemaScattered spider veins bilaterally in the right distal medial lateral thigh. No hyperpigmentation or ulceration noted distally. No bulging varicosities noted.  Today I reviewed the recent vascular studies which were performed in our lab. The venous study revealed some mild reflux in  the great saphenous vein which is of no significance with no DVT and minimal deep venous reflux in the left leg. The arterial study was normal with triphasic flow and excellent ABIs bilaterally.     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

## 2017-06-27 DIAGNOSIS — E782 Mixed hyperlipidemia: Secondary | ICD-10-CM | POA: Diagnosis not present

## 2017-06-27 DIAGNOSIS — Z23 Encounter for immunization: Secondary | ICD-10-CM | POA: Diagnosis not present

## 2017-06-27 DIAGNOSIS — Z0000 Encounter for general adult medical examination without abnormal findings: Secondary | ICD-10-CM | POA: Diagnosis not present

## 2017-06-27 DIAGNOSIS — R7303 Prediabetes: Secondary | ICD-10-CM | POA: Diagnosis not present

## 2017-08-16 DIAGNOSIS — D1801 Hemangioma of skin and subcutaneous tissue: Secondary | ICD-10-CM | POA: Diagnosis not present

## 2017-08-16 DIAGNOSIS — L821 Other seborrheic keratosis: Secondary | ICD-10-CM | POA: Diagnosis not present

## 2017-08-16 DIAGNOSIS — L570 Actinic keratosis: Secondary | ICD-10-CM | POA: Diagnosis not present

## 2017-08-16 DIAGNOSIS — D229 Melanocytic nevi, unspecified: Secondary | ICD-10-CM | POA: Diagnosis not present

## 2017-11-23 DIAGNOSIS — M21962 Unspecified acquired deformity of left lower leg: Secondary | ICD-10-CM | POA: Diagnosis not present

## 2017-11-23 DIAGNOSIS — M79672 Pain in left foot: Secondary | ICD-10-CM | POA: Diagnosis not present

## 2017-11-23 DIAGNOSIS — M21961 Unspecified acquired deformity of right lower leg: Secondary | ICD-10-CM | POA: Diagnosis not present

## 2017-11-23 DIAGNOSIS — M79671 Pain in right foot: Secondary | ICD-10-CM | POA: Diagnosis not present

## 2017-12-21 DIAGNOSIS — M79672 Pain in left foot: Secondary | ICD-10-CM | POA: Diagnosis not present

## 2017-12-21 DIAGNOSIS — M79671 Pain in right foot: Secondary | ICD-10-CM | POA: Diagnosis not present

## 2017-12-21 DIAGNOSIS — G5762 Lesion of plantar nerve, left lower limb: Secondary | ICD-10-CM | POA: Diagnosis not present

## 2017-12-21 DIAGNOSIS — G5761 Lesion of plantar nerve, right lower limb: Secondary | ICD-10-CM | POA: Diagnosis not present

## 2018-01-04 DIAGNOSIS — G5761 Lesion of plantar nerve, right lower limb: Secondary | ICD-10-CM | POA: Diagnosis not present

## 2018-01-04 DIAGNOSIS — G5762 Lesion of plantar nerve, left lower limb: Secondary | ICD-10-CM | POA: Diagnosis not present

## 2018-01-04 DIAGNOSIS — M79672 Pain in left foot: Secondary | ICD-10-CM | POA: Diagnosis not present

## 2018-01-04 DIAGNOSIS — M79671 Pain in right foot: Secondary | ICD-10-CM | POA: Diagnosis not present

## 2018-06-06 ENCOUNTER — Ambulatory Visit (INDEPENDENT_AMBULATORY_CARE_PROVIDER_SITE_OTHER): Payer: Medicare Other | Admitting: Cardiology

## 2018-06-06 ENCOUNTER — Encounter: Payer: Self-pay | Admitting: Cardiology

## 2018-06-06 VITALS — BP 134/62 | HR 61 | Ht 70.0 in | Wt 203.0 lb

## 2018-06-06 DIAGNOSIS — I491 Atrial premature depolarization: Secondary | ICD-10-CM

## 2018-06-06 DIAGNOSIS — R002 Palpitations: Secondary | ICD-10-CM

## 2018-06-06 NOTE — Patient Instructions (Signed)
Medication Instructions:  Your physician recommends that you continue on your current medications as directed. Please refer to the Current Medication list given to you today.  Labwork: None ordered.  Testing/Procedures: None ordered.  Follow-Up: Your physician wants you to follow-up in: one year with Dr. Curt Bears.   You will receive a reminder letter in the mail two months in advance. If you don't receive a letter, please call our office to schedule the follow-up appointment.  Any Other Special Instructions Will Be Listed Below (If Applicable).  If you need a refill on your cardiac medications before your next appointment, please call your pharmacy.

## 2018-06-06 NOTE — Progress Notes (Signed)
Electrophysiology Office Note   Date:  06/06/2018   ID:  Gerald Shelton, DOB 1941/03/16, MRN [id number]  PCP:  Glenford Bayley, DO  Primary Electrophysiologist:  Constance Haw, MD    No chief complaint on file.    History of Present Illness: Gerald Shelton is a 77 y.o. male who presents today for electrophysiology evaluation.   He has a history of hyperlipidemia. He initially presented to the office after an episode of chest pain as well as palpitations. He had a nuclear stress test that was low risk with a normal ejection fraction. He wore a 48 hour monitor that showed 5% APCs but no other arrhythmia. He says that he has continued to have the APCs, but they are not significantly worrisome. He also continues to have his same chest pain. The pain is in both the left and right side of his chest, depending on the episode. He says that it is not associated with exertion.  He stopped his propranolol at the last visit.   Today, denies symptoms of palpitations, chest pain, shortness of breath, orthopnea, PND, lower extremity edema, claudication, dizziness, presyncope, syncope, bleeding, or neurologic sequela. The patient is tolerating medications without difficulties.  He continues to have palpitations at times.  He notices them in the mornings only.  There are weeks that he feels well and other weeks that he notices the palpitations.  He says that they are not much of a hindrance in his daily activities.   Past Medical History:  Diagnosis Date  . A-fib ([place]) 10/25/2016  . Anxiety attack   . Atrial premature complexes   . Cancer ([place])    SKIN, ON FACE  . Chest pain 10/25/2016  . ED (erectile dysfunction)   . GERD (gastroesophageal reflux disease)   . Hay fever   . Hyperlipidemia   . Irregular heartbeat   . PUD (peptic ulcer disease)   . 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   History reviewed. No pertinent surgical history.   Current Outpatient Medications  Medication Sig  Dispense Refill  . Multiple Vitamins-Minerals (MULTI FOR HIM 50+) TABS Take by mouth.    . ranitidine (ZANTAC) 150 MG tablet Take 150 mg by mouth 2 (two) times daily.     No current facility-administered medications for this visit.     Allergies:   Augmentin [amoxicillin-pot clavulanate] and Simvastatin   Social History:  The patient  reports that he quit smoking about 19 years ago. He has never used smokeless tobacco. He reports that he drinks alcohol. He reports that he does not use drugs.   Family History:  The patient's family history includes Dementia in his mother; Depression in his mother; Diabetes in his son; Parkinson's disease in his mother.    ROS:  Please see the history of present illness.   Otherwise, review of systems is positive for palpitations.   All other systems are reviewed and negative.   PHYSICAL EXAM: VS:  BP 134/62   Pulse 61   Ht 5\' 10"  (1.778 m)   Wt 203 lb (92.1 kg)   SpO2 97%   BMI 29.13 kg/m  , BMI Body mass index is 29.13 kg/m. GEN: Well nourished, well developed, in no acute distress  HEENT: normal  Neck: no JVD, carotid bruits, or masses Cardiac: RRR; no murmurs, rubs, or gallops,no edema  Respiratory:  clear to auscultation bilaterally, normal work of breathing GI: soft, nontender, nondistended, + BS MS: no deformity or atrophy  Skin: warm  and dry Neuro:  Strength and sensation are intact Psych: euthymic mood, full affect  EKG:  EKG is ordered today. Personal review of the ekg ordered shows sinus arrhythmia, rate 61  Recent Labs: No results found for requested labs within last 8760 hours.    Lipid Panel  No results found for: CHOL, TRIG, HDL, CHOLHDL, VLDL, LDLCALC, LDLDIRECT   Wt Readings from Last 3 Encounters:  06/06/18 203 lb (92.1 kg)  06/26/17 199 lb (90.3 kg)  05/31/17 201 lb 9.6 oz (91.4 kg)      Other studies Reviewed: Additional studies/ records that were reviewed today include: Myoview 11/14/16  Nuclear stress EF:  57%.  There was no ST segment deviation noted during stress.  The study is normal.  This is a low risk study.  The left ventricular ejection fraction is normal (55-65%).   Normal pharmacologic nuclear stress test with no evidence for prior infarct or ischemia.  Holter 11/09/16 Minimum HR: 40 BPM at 11:54:42 AM Maximum HR: 101 BPM at 5:09:43 PM(2) Average HR: 62 BPM Rare PVCs 5.14% APCs Symptoms of rapid heart beats associated with sinus rhythm, rates 55-85 and occasional APCs   ASSESSMENT AND PLAN:  1.  Palpitations: Have 5% APCs on his Holter monitor.  He was previously taking propranolol but was taken off at his last visit.  He continues to feel well without major issue.  No changes at this time.    Current medicines are reviewed at length with the patient today.   The patient does not have concerns regarding his medicines.  The following changes were made today: None  Labs/ tests ordered today include:  Orders Placed This Encounter  Procedures  . EKG 12-Lead     Disposition:   FU with Genavie Boettger 12 months  Signed, Terral Cooks Meredith Leeds, MD  06/06/2018 11:10 AM     [facility] [address] [place] [address] [phone] (office) [phone] (fax)

## 2018-08-09 IMAGING — NM NM MISC PROCEDURE
6 series · 36 of 36 positions shown · non-contrast
Comparison: none

[Series 1: wbr_s-proj_st stress-sum-em · 6.40mm/px · 6 of 64 frames shown]
[frame 6/64]
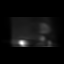
[frame 16/64]
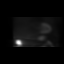
[frame 27/64]
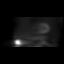
[frame 38/64]
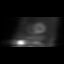
[frame 48/64]
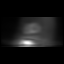
[frame 59/64]
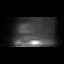

[Series 1: wbr_s-proj_st stress-gsp · 6.40mm/px · 6 of 512 frames shown]
[frame 43/512]
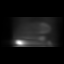
[frame 128/512]
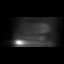
[frame 214/512]
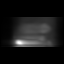
[frame 299/512]
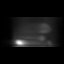
[frame 384/512]
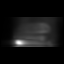
[frame 470/512]
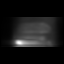

[Series 1: stress-sum-em · 6.40mm/px · 6 of 64 frames shown]
[frame 6/64]
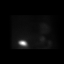
[frame 16/64]
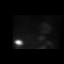
[frame 27/64]
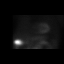
[frame 38/64]
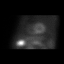
[frame 48/64]
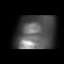
[frame 59/64]
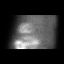

[Series 1: stress-gsp · 6.40mm/px · 6 of 512 frames shown]
[frame 43/512]
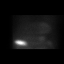
[frame 128/512]
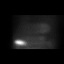
[frame 214/512]
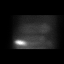
[frame 299/512]
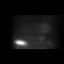
[frame 384/512]
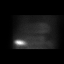
[frame 470/512]
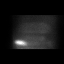

[Series 1: wbr_r-proj_st rest · 6.40mm/px · 6 of 64 frames shown]
[frame 6/64]
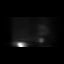
[frame 16/64]
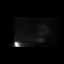
[frame 27/64]
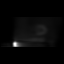
[frame 38/64]
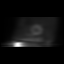
[frame 48/64]
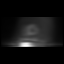
[frame 59/64]
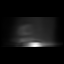

[Series 1: rest · 6.40mm/px · 6 of 64 frames shown]
[frame 6/64]
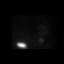
[frame 16/64]
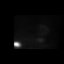
[frame 27/64]
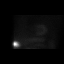
[frame 38/64]
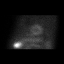
[frame 48/64]
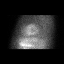
[frame 59/64]
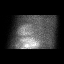

[36 of 36 positions shown; findings below may reference images not displayed]

Canned report from images found in remote index.

Refer to host system for actual result text.

## 2018-08-14 ENCOUNTER — Ambulatory Visit (INDEPENDENT_AMBULATORY_CARE_PROVIDER_SITE_OTHER): Payer: Medicare Other | Admitting: Family Medicine

## 2018-08-14 ENCOUNTER — Encounter: Payer: Self-pay | Admitting: Family Medicine

## 2018-08-14 VITALS — BP 132/78 | HR 64 | Temp 98.8°F | Resp 16 | Ht 70.0 in | Wt 207.6 lb

## 2018-08-14 DIAGNOSIS — Z125 Encounter for screening for malignant neoplasm of prostate: Secondary | ICD-10-CM | POA: Diagnosis not present

## 2018-08-14 DIAGNOSIS — I491 Atrial premature depolarization: Secondary | ICD-10-CM

## 2018-08-14 DIAGNOSIS — K219 Gastro-esophageal reflux disease without esophagitis: Secondary | ICD-10-CM | POA: Diagnosis not present

## 2018-08-14 DIAGNOSIS — E785 Hyperlipidemia, unspecified: Secondary | ICD-10-CM

## 2018-08-14 DIAGNOSIS — F411 Generalized anxiety disorder: Secondary | ICD-10-CM | POA: Insufficient documentation

## 2018-08-14 DIAGNOSIS — E782 Mixed hyperlipidemia: Secondary | ICD-10-CM | POA: Insufficient documentation

## 2018-08-14 LAB — COMPREHENSIVE METABOLIC PANEL
ALBUMIN: 4.1 g/dL (ref 3.5–5.2)
ALT: 17 U/L (ref 0–53)
AST: 17 U/L (ref 0–37)
Alkaline Phosphatase: 61 U/L (ref 39–117)
BUN: 20 mg/dL (ref 6–23)
CHLORIDE: 103 meq/L (ref 96–112)
CO2: 31 meq/L (ref 19–32)
Calcium: 9.3 mg/dL (ref 8.4–10.5)
Creatinine, Ser: 0.97 mg/dL (ref 0.40–1.50)
GFR: 80.08 mL/min (ref >60.00)
Glucose, Bld: 99 mg/dL (ref 70–99)
POTASSIUM: 4.2 meq/L (ref 3.5–5.1)
SODIUM: 140 meq/L (ref 135–145)
Total Bilirubin: 0.4 mg/dL (ref 0.2–1.2)
Total Protein: 6.5 g/dL (ref 6.0–8.3)

## 2018-08-14 LAB — CBC WITH DIFFERENTIAL/PLATELET
BASOS PCT: 0.7 % (ref 0.0–3.0)
Basophils Absolute: 0 10*3/uL (ref 0.0–0.1)
EOS PCT: 7.8 % — AB (ref 0.0–5.0)
Eosinophils Absolute: 0.5 10*3/uL (ref 0.0–0.7)
HCT: 43.2 % (ref 39.0–52.0)
Hemoglobin: 14.6 g/dL (ref 13.0–17.0)
LYMPHS ABS: 1.4 10*3/uL (ref 0.7–4.0)
Lymphocytes Relative: 21.6 % (ref 12.0–46.0)
MCHC: 33.7 g/dL (ref 30.0–36.0)
MCV: 91.3 fl (ref 78.0–100.0)
MONO ABS: 0.6 10*3/uL (ref 0.1–1.0)
Monocytes Relative: 9.5 % (ref 3.0–12.0)
NEUTROS PCT: 60.4 % (ref 43.0–77.0)
Neutro Abs: 3.8 10*3/uL (ref 1.4–7.7)
PLATELETS: 191 10*3/uL (ref 150.0–400.0)
RBC: 4.73 Mil/uL (ref 4.22–5.81)
RDW: 13.7 % (ref 11.5–15.5)
WBC: 6.3 10*3/uL (ref 4.0–10.5)

## 2018-08-14 LAB — LIPID PANEL
CHOL/HDL RATIO: 5
CHOLESTEROL: 203 mg/dL — AB (ref 0–200)
HDL: 43.4 mg/dL (ref >39.00)
NONHDL: 159.85
TRIGLYCERIDES: 228 mg/dL — AB (ref 0.0–149.0)
VLDL: 45.6 mg/dL — AB (ref 0.0–40.0)

## 2018-08-14 LAB — PSA: PSA: 0.62 ng/mL (ref 0.10–4.00)

## 2018-08-14 LAB — H. PYLORI ANTIBODY, IGG: H Pylori IgG: NEGATIVE

## 2018-08-14 LAB — LDL CHOLESTEROL, DIRECT: LDL DIRECT: 127 mg/dL

## 2018-08-14 MED ORDER — FAMOTIDINE 20 MG PO TABS
20.0000 mg | ORAL_TABLET | Freq: Two times a day (BID) | ORAL | Status: DC
Start: 2018-08-14 — End: 2018-09-19

## 2018-08-14 NOTE — Assessment & Plan Note (Addendum)
>  30 minutes spent in face to face time with patient, >50% spent in counselling or coordination of care reviewing medical history and medications. Followed by cardiology.  Does not take any rxs for afib (betablocker or ASA) as it was determined not to be afib per pt and cardiology notes.

## 2018-08-14 NOTE — Assessment & Plan Note (Signed)
Discussed H2 blockers vs PPIs. D/c'd zantac- discussed the recent recall. Start pepcid 20 mg twice daily twice daily. Check H pylori. Refer to GI for endoscopy- ? Hiatal hernia as well.

## 2018-08-14 NOTE — Assessment & Plan Note (Signed)
Currently controlled without rxs.

## 2018-08-14 NOTE — Progress Notes (Signed)
Subjective:   Patient ID: Gerald Shelton, male    DOB: 1941/05/03, 77 y.o.   MRN: [id number]  Gerald Shelton is a pleasant 77 y.o. 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 Curt Bears.  Was last seen on 06/06/18.  Note reviewed. Low risk stress test neg on 11/14/16. Wore a 48 hour holter monitor- 5% PACs without any other arrhythmia. He was told he no longer has afib.  ASA and betablocker were stopped.  He does still get fluttering and epigastric pressure/chest pressure but was told this is caused by GERD.  GERD- has episodes that aren't controlled with ranitidine 150 mg twice daily but overall, he feels this is controlled.  Took omeprazole for years over 6 years ago.  Cannot remember when he last had an endoscopy.  He thinks his colonoscopy was less than 10 years ago- awaiting records.  GAD- was on SSRI for many years but feels his symptoms are controlled without rx currently.  Current Outpatient Medications on File Prior to Visit  Medication Sig Dispense Refill  . Multiple Vitamins-Minerals (MULTI FOR HIM 50+) TABS Take by mouth.     No current facility-administered medications on file prior to visit.     Allergies  Allergen Reactions  . Augmentin [Amoxicillin-Pot Clavulanate] Other (See Comments)    GI UPSET  . Simvastatin Other (See Comments)    GI UPSET    Past Medical History:  Diagnosis Date  . A-fib ([place]) 10/25/2016  . Anxiety attack   . Atrial premature complexes   . Cancer ([place])    SKIN, ON FACE  . Chest pain 10/25/2016  . ED (erectile dysfunction)   . GERD (gastroesophageal reflux disease)   . Hay fever   . Hyperlipidemia   . Irregular heartbeat   . PUD (peptic ulcer disease)   . 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    No past surgical history on file.  Family History  Problem Relation Age of Onset  . Diabetes Son   . Parkinson's disease  Mother   . Depression Mother   . Dementia Mother     Social History   Socioeconomic History  . Marital status: Married    Spouse name: Not on file  . Number of children: Not on file  . Years of education: Not on file  . Highest education level: Not on file  Occupational History  . Not on file  Social Needs  . Financial resource strain: Not on file  . Food insecurity:    Worry: Not on file    Inability: Not on file  . Transportation needs:    Medical: Not on file    Non-medical: Not on file  Tobacco Use  . Smoking status: Former Smoker    Last attempt to quit: 10/25/1998    Years since quitting: 19.8  . Smokeless tobacco: Never Used  Substance and Sexual Activity  . Alcohol use: Yes    Comment: RARE  . Drug use: No  . Sexual activity: Not on file  Lifestyle  . Physical activity:    Days per week: Not on file    Minutes per session: Not on file  . Stress: Not on file  Relationships  . Social connections:    Talks on phone: Not on file    Gets together: Not on file    Attends religious service:  Not on file    Active member of club or organization: Not on file    Attends meetings of clubs or organizations: Not on file    Relationship status: Not on file  . Intimate partner violence:    Fear of current or ex partner: Not on file    Emotionally abused: Not on file    Physically abused: Not on file    Forced sexual activity: Not on file  Other Topics Concern  . Not on file  Social History Narrative  . Not on file   The PMH, PSH, Social History, Family History, Medications, and allergies have been reviewed in [facility], and have been updated if relevant.   Review of Systems  Constitutional: Negative.   HENT: Negative.   Respiratory: Positive for chest tightness.   Cardiovascular: Negative.   Gastrointestinal: Positive for abdominal pain. Negative for anal bleeding, blood in stool, constipation, diarrhea, nausea, rectal pain and vomiting.  Endocrine: Negative.     Genitourinary: Negative.   Musculoskeletal: Negative.   Skin: Negative.   Allergic/Immunologic: Negative.   Neurological: Negative.   Hematological: Negative.   Psychiatric/Behavioral: Negative.   All other systems reviewed and are negative.      Objective:    BP 132/78   Pulse 64   Temp 98.8 F (37.1 C) (Oral)   Resp 16   Ht 5\' 10"  (1.778 m)   Wt 207 lb 9.6 oz (94.2 kg)   SpO2 95%   BMI 29.79 kg/m    Physical Exam   General:  pleasant male in no acute distress Eyes:  PERRL Ears:  External ear exam shows no significant lesions or deformities.  TMs normal bilaterally Hearing is grossly normal bilaterally. Nose:  External nasal examination shows no deformity or inflammation. Nasal mucosa are pink and moist without lesions or exudates. Mouth:  Oral mucosa and oropharynx without lesions or exudates.  Teeth in good repair. Neck:  no carotid bruit or thyromegaly no cervical or supraclavicular lymphadenopathy  Lungs:  Normal respiratory effort, chest expands symmetrically. Lungs are clear to auscultation, no crackles or wheezes. Heart:  Normal rate and regular rhythm. S1 and S2 normal without gallop, murmur, click, rub or other extra sounds. Abdomen:  Bowel sounds positive,abdomen soft and non-tender without masses, organomegaly or hernias noted. Pulses:  R and L posterior tibial pulses are full and equal bilaterally  Extremities:  no edema  Psych:  Good eye contact, not anxious or depressed appearing     Assessment & Plan:   Screening for prostate cancer - Plan: PSA  APC (atrial premature contractions)  Gastroesophageal reflux disease without esophagitis - Plan: Ambulatory referral to Gastroenterology, H. pylori antibody, IgG  Hyperlipidemia, unspecified hyperlipidemia type - Plan: Comprehensive metabolic panel, Lipid panel, CBC with Differential/Platelet  GAD (generalized anxiety disorder) Return in about 2 months (around 10/14/2018) for a complete physical..

## 2018-08-14 NOTE — Patient Instructions (Addendum)
Great to meet you. I will call you with your lab results from today and you can view them online.   On your way out, please sign release form to get records from Dr. Truman Hayward and Dr.Kent Tamala Julian.  Let's have you stop taking zantac and start taking Pepcid 20 mg twice daily.  Someone will call you from GI with an appointment with Dr. Hilarie Fredrickson.

## 2018-08-14 NOTE — Assessment & Plan Note (Signed)
Check labs today.

## 2018-08-23 ENCOUNTER — Encounter: Payer: Self-pay | Admitting: Gastroenterology

## 2018-09-19 ENCOUNTER — Ambulatory Visit (INDEPENDENT_AMBULATORY_CARE_PROVIDER_SITE_OTHER): Payer: Medicare Other | Admitting: Gastroenterology

## 2018-09-19 ENCOUNTER — Encounter: Payer: Self-pay | Admitting: Gastroenterology

## 2018-09-19 VITALS — BP 124/70 | HR 72 | Ht 70.0 in | Wt 205.6 lb

## 2018-09-19 DIAGNOSIS — K219 Gastro-esophageal reflux disease without esophagitis: Secondary | ICD-10-CM | POA: Diagnosis not present

## 2018-09-19 MED ORDER — SUCRALFATE 1 G PO TABS: 1.0000 g | ORAL_TABLET | Freq: Two times a day (BID) | ORAL | 1 refills | Status: DC

## 2018-09-19 NOTE — Patient Instructions (Addendum)
We have sent Carafate to your pharmacy   Your recall colonoscopy will be in 2022  Follow up in 6-12 months    Gastroesophageal Reflux Disease, Adult Normally, food travels down the esophagus and stays in the stomach to be digested. However, when a person has gastroesophageal reflux disease (GERD), food and stomach acid move back up into the esophagus. When this happens, the esophagus becomes sore and inflamed. Over time, GERD can create small holes (ulcers) in the lining of the esophagus. What are the causes? This condition is caused by a problem with the muscle between the esophagus and the stomach (lower esophageal sphincter, or LES). Normally, the LES muscle closes after food passes through the esophagus to the stomach. When the LES is weakened or abnormal, it does not close properly, and that allows food and stomach acid to go back up into the esophagus. The LES can be weakened by certain dietary substances, medicines, and medical conditions, including:  Tobacco use.  Pregnancy.  Having a hiatal hernia.  Heavy alcohol use.  Certain foods and beverages, such as coffee, chocolate, onions, and peppermint.  What increases the risk? This condition is more likely to develop in:  People who have an increased body weight.  People who have connective tissue disorders.  People who use NSAID medicines.  What are the signs or symptoms? Symptoms of this condition include:  Heartburn.  Difficult or painful swallowing.  The feeling of having a lump in the throat.  Abitter taste in the mouth.  Bad breath.  Having a large amount of saliva.  Having an upset or bloated stomach.  Belching.  Chest pain.  Shortness of breath or wheezing.  Ongoing (chronic) cough or a night-time cough.  Wearing away of tooth enamel.  Weight loss.  Different conditions can cause chest pain. Make sure to see your health care provider if you experience chest pain. How is this diagnosed? Your  health care provider will take a medical history and perform a physical exam. To determine if you have mild or severe GERD, your health care provider may also monitor how you respond to treatment. You may also have other tests, including:  An endoscopy toexamine your stomach and esophagus with a small camera.  A test thatmeasures the acidity level in your esophagus.  A test thatmeasures how much pressure is on your esophagus.  A barium swallow or modified barium swallow to show the shape, size, and functioning of your esophagus.  How is this treated? The goal of treatment is to help relieve your symptoms and to prevent complications. Treatment for this condition may vary depending on how severe your symptoms are. Your health care provider may recommend:  Changes to your diet.  Medicine.  Surgery.  Follow these instructions at home: Diet  Follow a diet as recommended by your health care provider. This may involve avoiding foods and drinks such as: ? Coffee and tea (with or without caffeine). ? Drinks that containalcohol. ? Energy drinks and sports drinks. ? Carbonated drinks or sodas. ? Chocolate and cocoa. ? Peppermint and mint flavorings. ? Garlic and onions. ? Horseradish. ? Spicy and acidic foods, including peppers, chili powder, curry powder, vinegar, hot sauces, and barbecue sauce. ? Citrus fruit juices and citrus fruits, such as oranges, lemons, and limes. ? Tomato-based foods, such as red sauce, chili, salsa, and pizza with red sauce. ? Fried and fatty foods, such as donuts, french fries, potato chips, and high-fat dressings. ? High-fat meats, such as hot dogs  and fatty cuts of red and white meats, such as rib eye steak, sausage, ham, and bacon. ? High-fat dairy items, such as whole milk, butter, and cream cheese.  Eat small, frequent meals instead of large meals.  Avoid drinking large amounts of liquid with your meals.  Avoid eating meals during the 2-3 hours  before bedtime.  Avoid lying down right after you eat.  Do not exercise right after you eat. General instructions  Pay attention to any changes in your symptoms.  Take over-the-counter and prescription medicines only as told by your health care provider. Do not take aspirin, ibuprofen, or other NSAIDs unless your health care provider told you to do so.  Do not use any tobacco products, including cigarettes, chewing tobacco, and e-cigarettes. If you need help quitting, ask your health care provider.  Wear loose-fitting clothing. Do not wear anything tight around your waist that causes pressure on your abdomen.  Raise (elevate) the head of your bed 6 inches (15cm).  Try to reduce your stress, such as with yoga or meditation. If you need help reducing stress, ask your health care provider.  If you are overweight, reduce your weight to an amount that is healthy for you. Ask your health care provider for guidance about a safe weight loss goal.  Keep all follow-up visits as told by your health care provider. This is important. Contact a health care provider if:  You have new symptoms.  You have unexplained weight loss.  You have difficulty swallowing, or it hurts to swallow.  You have wheezing or a persistent cough.  Your symptoms do not improve with treatment.  You have a hoarse voice. Get help right away if:  You have pain in your arms, neck, jaw, teeth, or back.  You feel sweaty, dizzy, or light-headed.  You have chest pain or shortness of breath.  You vomit and your vomit looks like blood or coffee grounds.  You faint.  Your stool is bloody or black.  You cannot swallow, drink, or eat. This information is not intended to replace advice given to you by your health care provider. Make sure you discuss any questions you have with your health care provider. Document Released: 07/06/2005 Document Revised: 02/24/2016 Document Reviewed: 01/21/2015 Elsevier Interactive  Patient Education  2018 [place] for Gastroesophageal Reflux Disease, Adult When you have gastroesophageal reflux disease (GERD), the foods you eat and your eating habits are very important. Choosing the right foods can help ease your discomfort. What guidelines do I need to follow?  Choose fruits, vegetables, whole grains, and low-fat dairy products.  Choose low-fat meat, fish, and poultry.  Limit fats such as oils, salad dressings, butter, nuts, and avocado.  Keep a food diary. This helps you identify foods that cause symptoms.  Avoid foods that cause symptoms. These may be different for everyone.  Eat small meals often instead of 3 large meals a day.  Eat your meals slowly, in a place where you are relaxed.  Limit fried foods.  Cook foods using methods other than frying.  Avoid drinking alcohol.  Avoid drinking large amounts of liquids with your meals.  Avoid bending over or lying down until 2-3 hours after eating. What foods are not recommended? These are some foods and drinks that may make your symptoms worse: Vegetables Tomatoes. Tomato juice. Tomato and spaghetti sauce. Chili peppers. Onion and garlic. Horseradish. Fruits Oranges, grapefruit, and lemon (fruit and juice). Meats High-fat meats, fish, and poultry.  This includes hot dogs, ribs, ham, sausage, salami, and bacon. Dairy Whole milk and chocolate milk. Sour cream. Cream. Butter. Ice cream. Cream cheese. Drinks Coffee and tea. Bubbly (carbonated) drinks or energy drinks. Condiments Hot sauce. Barbecue sauce. Sweets/Desserts Chocolate and cocoa. Donuts. Peppermint and spearmint. Fats and Oils High-fat foods. This includes [place] fries and potato chips. Other Vinegar. Strong spices. This includes black pepper, white pepper, red pepper, cayenne, curry powder, cloves, ginger, and chili powder. The items listed above may not be a complete list of foods and drinks to avoid. Contact your  dietitian for more information. This information is not intended to replace advice given to you by your health care provider. Make sure you discuss any questions you have with your health care provider. Document Released: 03/27/2012 Document Revised: 03/03/2016 Document Reviewed: 07/31/2013 Elsevier Interactive Patient Education  2017 [employer].

## 2018-09-19 NOTE — Progress Notes (Signed)
Gerald Shelton    [id number]    Nov 08, 1940  Primary Care Physician:Aron, Marciano Sequin, MD  Referring Physician: Lucille Passy, MD [place], [place] [address]  Chief complaint:  GERD  HPI: 33 yr M here for new patient visit to establish care here.  He moved to [place] from [place] about 3 years ago.  He has history of chronic GERD and was taking omeprazole daily for many years until last year.  He discontinued PPI due to bad press about potential side effects.  He took Zantac for few months and then was subsequently switched to Pepcid by Dr. Deborra Medina few weeks ago.  He has not started taking Pepcid. He had 2 episodes in the past few months which were both triggered when he was laying down completely flat on his back and exercising to improve core abdominal wall strength.  He felt discomfort in the chest with nausea and burning sensation.  He does not get heartburn on a regular basis.  Denies any dysphagia, odynophagia, abdominal pain, vomiting, loss of appetite or weight loss.  He had EGD with biopsies and colonoscopy in 2012 in [place], [place]: Both normal as per PMDs note.  Actual reports not available during this visit.     Outpatient Encounter Medications as of 09/19/2018  Medication Sig  . Multiple Vitamins-Minerals (MULTI FOR HIM 50+) TABS Take by mouth.  . Omega-3 Fatty Acids (FISH OIL) 1200 MG CPDR Take 1 capsule by mouth daily.  . [DISCONTINUED] famotidine (PEPCID) 20 MG tablet Take 1 tablet (20 mg total) by mouth 2 (two) times daily.   No facility-administered encounter medications on file as of 09/19/2018.     Allergies as of 09/19/2018 - Review Complete 09/19/2018  Allergen Reaction Noted  . Augmentin [amoxicillin-pot clavulanate] Other (See Comments) 10/25/2016  . Simvastatin Other (See Comments) 10/25/2016    Past Medical History:  Diagnosis Date  . A-fib ([place]) 10/25/2016  . Anxiety attack   . Atrial premature complexes   .  Cancer ([place])    SKIN, ON FACE  . Chest pain 10/25/2016  . ED (erectile dysfunction)   . GERD (gastroesophageal reflux disease)   . Hay fever   . Hyperlipidemia   . Irregular heartbeat   . PUD (peptic ulcer disease)   . 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    Past Surgical History:  Procedure Laterality Date  . HERNIA REPAIR     x2  . NASAL SEPTUM SURGERY      Family History  Problem Relation Age of Onset  . Diabetes Son   . Parkinson's disease Mother   . Depression Mother   . Dementia Mother     Social History   Socioeconomic History  . Marital status: Married    Spouse name: Not on file  . Number of children: 6  . Years of education: Not on file  . Highest education level: Not on file  Occupational History  . Occupation: Retired  Scientific laboratory technician  . Financial resource strain: Not on file  . Food insecurity:    Worry: Not on file    Inability: Not on file  . Transportation needs:    Medical: Not on file    Non-medical: Not on file  Tobacco Use  . Smoking status: Former Smoker    Last attempt to quit: 10/25/1998    Years since quitting: 19.9  . Smokeless tobacco: Never Used  Substance and  Sexual Activity  . Alcohol use: Yes    Comment: RARE  . Drug use: No  . Sexual activity: Not on file  Lifestyle  . Physical activity:    Days per week: Not on file    Minutes per session: Not on file  . Stress: Not on file  Relationships  . Social connections:    Talks on phone: Not on file    Gets together: Not on file    Attends religious service: Not on file    Active member of club or organization: Not on file    Attends meetings of clubs or organizations: Not on file    Relationship status: Not on file  . Intimate partner violence:    Fear of current or ex partner: Not on file    Emotionally abused: Not on file    Physically abused: Not on file    Forced sexual activity: Not on file  Other Topics Concern  . Not on file  Social History Narrative  .  Not on file      Review of systems: Review of Systems  Constitutional: Negative for fever and chills.  HENT: Positive for hearing problems and sinus trouble Eyes: Negative for blurred vision.  Respiratory: Negative for cough, shortness of breath and wheezing.   Cardiovascular: Negative for chest pain and palpitations.  Gastrointestinal: as per HPI Genitourinary: Negative for dysuria, urgency, and hematuria.  Positive for urinary frequency Musculoskeletal: Positive for myalgias, back pain and joint pain.  Skin: Negative for itching and rash.  Neurological: Negative for dizziness, tremors, focal weakness, seizures and loss of consciousness.  Endo/Heme/Allergies: Negative for seasonal allergies.  Psychiatric/Behavioral: Negative for depression, suicidal ideas and hallucinations.  All other systems reviewed and are negative.   Physical Exam: Vitals:   09/19/18 1324  BP: 124/70  Pulse: 72   Body mass index is 29.5 kg/m. Gen:      No acute distress HEENT:  EOMI, sclera anicteric Neck:     No masses; no thyromegaly Lungs:    Clear to auscultation bilaterally; normal respiratory effort CV:         Regular rate and rhythm; no murmurs Abd:      + bowel sounds; soft, non-tender; no palpable masses, no distension Ext:    No edema; adequate peripheral perfusion Skin:      Warm and dry; no rash Neuro: alert and oriented x 3 Psych: normal mood and affect  Data Reviewed:  Reviewed labs, radiology imaging, old records and pertinent past GI work up   Assessment and Plan/Recommendations:  77 year old male with chronic GERD is here to establish care He has occasional breakthrough symptoms mostly triggered during exercise when he lays down flat Discussed lifestyle modifications antireflux measures Continue Pepcid 20 mg twice daily as needed Advised patient to take Carafate 1 g tablet twice daily as needed for esophageal spasm, chest discomfort and reflux symptoms Given he has no new   alarming symptoms and EGD in 2012 was unremarkable, we will hold off repeating EGD. Return in 1 year or sooner if needed  Due for repeat colonoscopy for colorectal cancer screening in 2022  Greater than 50% of the time used for counseling as well as treatment plan and follow-up. He had multiple questions which were answered to his satisfaction  K. Denzil Magnuson , MD [phone]    CC: Lucille Passy, MD

## 2018-10-16 DIAGNOSIS — Z0000 Encounter for general adult medical examination without abnormal findings: Secondary | ICD-10-CM | POA: Insufficient documentation

## 2018-10-16 NOTE — Progress Notes (Signed)
Subjective:   Patient ID: Gerald Shelton, male    DOB: Feb 13, 1941, 78 y.o.   MRN: [id number]  Gerald Shelton is a pleasant 78 y.o. year old male who presents to clinic today with Annual Exam and Follow-up (Patient is here today for a F/U.  He is scheduled to see [place] at [facility] were completed on 11.5.19.  Pt saw Gerald Bowie, MD @ GI on 12.11.19 and saw Gerald Camnitz,MD @ Cards on 8.28.19.  Colonoscopy is UTD.  He is due for PNV-23 vaccine.)  on 10/17/2018  HPI:  Has wellness visit scheduled with Gerald Haring, RN today as well.  Health Maintenance  Topic Date Due  . PNA vac Low Risk Adult (2 of 2 - PPSV23) 11/11/2015  . COLONOSCOPY  10/10/2021  . TETANUS/TDAP  02/01/2024  . INFLUENZA VACCINE  Completed     Established care with me on 08/14/18. I have not seen him since that [place].  Note reviewed.  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 Curt Bears.  Was last seen on 06/06/18.  Note reviewed. Low risk stress test neg on 11/14/16. Wore a 48 hour holter monitor- 5% PACs without any other arrhythmia. He was told he no longer has afib - ASA and betablocker were therefore stopped.  He does still get fluttering and epigastric pressure/chest pressure but was told this is caused by GERD.  Lab Results  Component Value Date   CHOL 203 (H) 08/14/2018   HDL 43.40 08/14/2018   LDLDIRECT 127.0 08/14/2018   TRIG 228.0 (H) 08/14/2018   CHOLHDL 5 08/14/2018    GERD-  was taking ranitidine 150 mg twice daily but overall.  We d/c'd his zantac and started him on Pepcid 20 mg twice daily in 08/2018.  Took omeprazole for years over 6 years ago.  Referred him to GI. Saw GI,  Dr. Silverio Decamp, on 09/19/18- note reviewed.   Carafate 1 gram twice daily as needed was added to Pepcid 20 mg twice daily. Has not needed to take carafate yet.  Endoscopy deferred.  Due for colonoscopy in 2022.   GAD- was on SSRI for many years but feels his symptoms are controlled without rx  currently.  Having left great toe pain- intermittent for a couple months.  Never warm, red.  Current Outpatient Medications on File Prior to Visit  Medication Sig Dispense Refill  . Multiple Vitamins-Minerals (MULTI FOR HIM 50+) TABS Take by mouth.    . Omega-3 Fatty Acids (FISH OIL) 1200 MG CPDR Take 1 capsule by mouth daily.    . sucralfate (CARAFATE) 1 g tablet Take 1 tablet (1 g total) by mouth 2 (two) times daily. As needed 30 tablet 1   No current facility-administered medications on file prior to visit.     Allergies  Allergen Reactions  . Augmentin [Amoxicillin-Pot Clavulanate] Other (See Comments)    GI UPSET  . Simvastatin Other (See Comments)    GI UPSET    Past Medical History:  Diagnosis Date  . A-fib ([place]) 10/25/2016  . Anxiety attack   . Atrial premature complexes   . Cancer ([place])    SKIN, ON FACE  . Chest pain 10/25/2016  . ED (erectile dysfunction)   . GERD (gastroesophageal reflux disease)   . Hay fever   . Hyperlipidemia   . Irregular heartbeat   . PUD (peptic ulcer disease)   . 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    Past Surgical  History:  Procedure Laterality Date  . HERNIA REPAIR     x2  . NASAL SEPTUM SURGERY      Family History  Problem Relation Age of Onset  . Diabetes Son   . Parkinson's disease Mother   . Depression Mother   . Dementia Mother     Social History   Socioeconomic History  . Marital status: Married    Spouse name: Not on file  . Number of children: 6  . Years of education: Not on file  . Highest education level: Not on file  Occupational History  . Occupation: Retired  Scientific laboratory technician  . Financial resource strain: Not on file  . Food insecurity:    Worry: Not on file    Inability: Not on file  . Transportation needs:    Medical: Not on file    Non-medical: Not on file  Tobacco Use  . Smoking status: Former Smoker    Last attempt to quit: 10/25/1998    Years since quitting: 19.9  . Smokeless  tobacco: Never Used  Substance and Sexual Activity  . Alcohol use: Yes    Comment: RARE  . Drug use: No  . Sexual activity: Not on file  Lifestyle  . Physical activity:    Days per week: Not on file    Minutes per session: Not on file  . Stress: Not on file  Relationships  . Social connections:    Talks on phone: Not on file    Gets together: Not on file    Attends religious service: Not on file    Active member of club or organization: Not on file    Attends meetings of clubs or organizations: Not on file    Relationship status: Not on file  . Intimate partner violence:    Fear of current or ex partner: Not on file    Emotionally abused: Not on file    Physically abused: Not on file    Forced sexual activity: Not on file  Other Topics Concern  . Not on file  Social History Narrative  . Not on file   The PMH, PSH, Social History, Family History, Medications, and allergies have been reviewed in [facility], and have been updated if relevant.   Review of Systems  Constitutional: Negative.   HENT: Negative.   Eyes: Negative.   Respiratory: Negative.   Cardiovascular: Negative.   Gastrointestinal: Negative.   Endocrine: Negative.   Genitourinary: Negative.   Musculoskeletal: Positive for arthralgias.  Allergic/Immunologic: Negative.   Neurological: Negative.   Hematological: Negative.   Psychiatric/Behavioral: Negative.   All other systems reviewed and are negative.      Objective:    BP 134/68 (BP Location: Left Arm, Patient Position: Sitting, Cuff Size: Normal)   Pulse 70   Temp 97.6 F (36.4 C) (Oral)   Ht 5\' 10"  (1.778 m)   Wt 205 lb 3.2 oz (93.1 kg)   SpO2 95%   BMI 29.44 kg/m    Physical Exam Vitals signs and nursing note reviewed.  Constitutional:      General: He is not in acute distress.    Appearance: Normal appearance. He is not toxic-appearing.  HENT:     Head: Normocephalic and atraumatic.     Right Ear: Tympanic membrane normal.     Left Ear:  Tympanic membrane normal.     Nose: Nose normal.     Mouth/Throat:     Mouth: Mucous membranes are moist.  Eyes:  Extraocular Movements: Extraocular movements intact.  Neck:     Musculoskeletal: Normal range of motion and neck supple.  Cardiovascular:     Rate and Rhythm: Normal rate and regular rhythm.  Pulmonary:     Effort: Pulmonary effort is normal.     Breath sounds: Normal breath sounds.  Abdominal:     Palpations: Abdomen is soft.  Musculoskeletal: Normal range of motion.  Feet:     Right foot:     Toenail Condition: Right toenails are ingrown.     Left foot:     Toenail Condition: Left toenails are normal.  Skin:    General: Skin is warm and dry.  Neurological:     General: No focal deficit present.     Mental Status: He is alert and oriented to person, place, and time. Mental status is at baseline.  Psychiatric:        Mood and Affect: Mood normal.        Behavior: Behavior normal.        Thought Content: Thought content normal.        Judgment: Judgment normal.           Assessment & Plan:   Hyperlipidemia, unspecified hyperlipidemia type  Gastroesophageal reflux disease without esophagitis  APC (atrial premature contractions)  GAD (generalized anxiety disorder)  Visit for well man health check  Need for pneumococcal vaccination - Plan: Pneumococcal polysaccharide vaccine 23-valent greater than or equal to 2yo subcutaneous/IM  Great toe pain, left  Ingrown toenail of right foot No follow-ups on file.

## 2018-10-16 NOTE — Progress Notes (Signed)
Subjective:   Gerald Shelton is a 78 y.o. male who presents for Medicare Annual/Subsequent preventive examination.  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  Review of Systems: No ROS.  Medicare Wellness Visit. Additional risk factors are reflected in the social history. Cardiac Risk Factors include: advanced age (>43men, >35 women);dyslipidemia;male gender   Sleep patterns: sleeps well Home Safety/Smoke Alarms: Feels safe in home. Smoke alarms in place. Lives in 1 story home with wife and 4 dogs.   Male:   CCS- 05/11/2011    PSA-  Lab Results  Component Value Date   PSA 0.62 08/14/2018       Objective:    Vitals: BP 134/68 (BP Location: Left Arm, Patient Position: Sitting, Cuff Size: Normal)   Pulse 70   Ht 5\' 10"  (1.778 m)   Wt 205 lb 3.2 oz (93.1 kg)   SpO2 95%   BMI 29.44 kg/m   Body mass index is 29.44 kg/m.  Advanced Directives 10/17/2018  Does Patient Have a Medical Advance Directive? Yes  Type of Paramedic of [place];Living will  Does patient want to make changes to medical advance directive? No - Patient declined  Copy of [place] in Chart? No - copy requested    Tobacco Social History   Tobacco Use  Smoking Status Former Smoker  . Last attempt to quit: 10/25/1998  . Years since quitting: 19.9  Smokeless Tobacco Never Used     Counseling given: Not Answered   Clinical Intake:  Pain : No/denies pain     Past Medical History:  Diagnosis Date  . A-fib ([place]) 10/25/2016  . Anxiety attack   . Atrial premature complexes   . Cancer ([place])    SKIN, ON FACE  . Chest pain 10/25/2016  . ED (erectile dysfunction)   . GERD (gastroesophageal reflux disease)   . Hay fever   . Hyperlipidemia   . Irregular heartbeat   . PUD (peptic ulcer disease)   . 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   Past Surgical History:  Procedure Laterality Date  . HERNIA REPAIR     x2  . NASAL SEPTUM SURGERY      Family History  Problem Relation Age of Onset  . Diabetes Son   . Parkinson's disease Mother   . Depression Mother   . Dementia Mother    Social History   Socioeconomic History  . Marital status: Married    Spouse name: Not on file  . Number of children: 6  . Years of education: Not on file  . Highest education level: Not on file  Occupational History  . Occupation: Retired  Scientific laboratory technician  . Financial resource strain: Not on file  . Food insecurity:    Worry: Not on file    Inability: Not on file  . Transportation needs:    Medical: Not on file    Non-medical: Not on file  Tobacco Use  . Smoking status: Former Smoker    Last attempt to quit: 10/25/1998    Years since quitting: 19.9  . Smokeless tobacco: Never Used  Substance and Sexual Activity  . Alcohol use: Yes    Comment: RARE  . Drug use: No  . Sexual activity: Not Currently  Lifestyle  . Physical activity:    Days per week: Not on file    Minutes per session: Not on file  . Stress: Not on file  Relationships  . Social connections:  Talks on phone: Not on file    Gets together: Not on file    Attends religious service: Not on file    Active member of club or organization: Not on file    Attends meetings of clubs or organizations: Not on file    Relationship status: Not on file  Other Topics Concern  . Not on file  Social History Narrative  . Not on file    Outpatient Encounter Medications as of 10/17/2018  Medication Sig  . Multiple Vitamins-Minerals (MULTI FOR HIM 50+) TABS Take by mouth.  . Omega-3 Fatty Acids (FISH OIL) 1200 MG CPDR Take 1 capsule by mouth daily.  . sucralfate (CARAFATE) 1 g tablet Take 1 tablet (1 g total) by mouth 2 (two) times daily. As needed   No facility-administered encounter medications on file as of 10/17/2018.     Activities of Daily Living In your present state of health, do you have any difficulty performing the following activities: 10/17/2018 10/17/2018  Hearing? Y N   Comment wearing hearing aids -  Vision? N N  Comment wearing glasses.  -  Difficulty concentrating or making decisions? N N  Walking or climbing stairs? N N  Dressing or bathing? N N  Doing errands, shopping? N N  Preparing Food and eating ? N -  Using the Toilet? N -  In the past six months, have you accidently leaked urine? N -  Do you have problems with loss of bowel control? N -  Managing your Medications? N -  Managing your Finances? N -  Housekeeping or managing your Housekeeping? N -  Some recent data might be hidden    Patient Care Team: Lucille Passy, MD as PCP - General (Family Medicine) Constance Haw, MD as PCP - Electrophysiology (Cardiology)   Assessment:   This is a routine wellness examination for Gerald Shelton. Physical assessment deferred to PCP.  Exercise Activities and Dietary recommendations Current Exercise Habits: The patient does not participate in regular exercise at present, Exercise limited by: None identified   Diet (meal preparation, eat out, water intake, caffeinated beverages, dairy products, fruits and vegetables): 24 hour recall Breakfast: PB&J bagel and juice Lunch: sandwich and soup Dinner:  Meat and starch  Goals    . DIET - EAT MORE FRUITS AND VEGETABLES    . Increase physical activity     Go to gym at least once per week       Fall Risk Fall Risk  10/17/2018 10/17/2018  Falls in the past year? 0 0  Follow up - Falls evaluation completed     Depression Screen PHQ 2/9 Scores 10/17/2018 10/17/2018  PHQ - 2 Score 0 0    Cognitive Function Ad8 score reviewed for issues:  Issues making decisions:no  Less interest in hobbies / activities:no  Repeats questions, stories (family complaining):no  Trouble using ordinary gadgets (microwave, computer, phone):no  Forgets the month or year: no  Mismanaging finances: no  Remembering appts:no  Daily problems with thinking and/or memory:no Ad8 score is=0        Immunization History   Administered Date(s) Administered  . Influenza, High Dose Seasonal PF 08/24/2018  . Pneumococcal Conjugate-13 11/10/2014  . Pneumococcal Polysaccharide-23 10/17/2018  . Tdap 01/31/2014    Screening Tests Health Maintenance  Topic Date Due  . PNA vac Low Risk Adult (2 of 2 - PPSV23) 11/11/2015  . COLONOSCOPY  10/10/2021  . TETANUS/TDAP  02/01/2024  . INFLUENZA VACCINE  Completed  Plan:    Please schedule your next medicare wellness visit with me in 1 yr.  Continue to eat heart healthy diet (full of fruits, vegetables, whole grains, lean protein, water--limit salt, fat, and sugar intake) and increase physical activity as tolerated.  Continue doing brain stimulating activities (puzzles, reading, adult coloring books, staying active) to keep memory sharp.   Bring a copy of your living will and/or healthcare power of attorney to your next office visit.   I have personally reviewed and noted the following in the patient's chart:   . Medical and social history . Use of alcohol, tobacco or illicit drugs  . Current medications and supplements . Functional ability and status . Nutritional status . Physical activity . Advanced directives . List of other physicians . Hospitalizations, surgeries, and ER visits in previous 12 months . Vitals . Screenings to include cognitive, depression, and falls . Referrals and appointments  In addition, I have reviewed and discussed with patient certain preventive protocols, quality metrics, and best practice recommendations. A written personalized care plan for preventive services as well as general preventive health recommendations were provided to patient.     Shela Nevin, [place]  10/17/2018

## 2018-10-17 ENCOUNTER — Ambulatory Visit (INDEPENDENT_AMBULATORY_CARE_PROVIDER_SITE_OTHER): Payer: Medicare Other | Admitting: *Deleted

## 2018-10-17 ENCOUNTER — Encounter: Payer: Self-pay | Admitting: *Deleted

## 2018-10-17 ENCOUNTER — Ambulatory Visit (INDEPENDENT_AMBULATORY_CARE_PROVIDER_SITE_OTHER): Payer: Medicare Other | Admitting: Family Medicine

## 2018-10-17 ENCOUNTER — Encounter: Payer: Self-pay | Admitting: Family Medicine

## 2018-10-17 VITALS — BP 134/68 | HR 70 | Ht 70.0 in | Wt 205.2 lb

## 2018-10-17 VITALS — BP 134/68 | HR 70 | Temp 97.6°F | Ht 70.0 in | Wt 205.2 lb

## 2018-10-17 DIAGNOSIS — I491 Atrial premature depolarization: Secondary | ICD-10-CM | POA: Diagnosis not present

## 2018-10-17 DIAGNOSIS — Z0000 Encounter for general adult medical examination without abnormal findings: Secondary | ICD-10-CM

## 2018-10-17 DIAGNOSIS — M79675 Pain in left toe(s): Secondary | ICD-10-CM | POA: Insufficient documentation

## 2018-10-17 DIAGNOSIS — E785 Hyperlipidemia, unspecified: Secondary | ICD-10-CM | POA: Diagnosis not present

## 2018-10-17 DIAGNOSIS — F411 Generalized anxiety disorder: Secondary | ICD-10-CM

## 2018-10-17 DIAGNOSIS — Z23 Encounter for immunization: Secondary | ICD-10-CM

## 2018-10-17 DIAGNOSIS — Z0001 Encounter for general adult medical examination with abnormal findings: Secondary | ICD-10-CM | POA: Diagnosis not present

## 2018-10-17 DIAGNOSIS — K219 Gastro-esophageal reflux disease without esophagitis: Secondary | ICD-10-CM

## 2018-10-17 DIAGNOSIS — L600 Ingrowing nail: Secondary | ICD-10-CM

## 2018-10-17 NOTE — Progress Notes (Signed)
I reviewed health advisor's note, was available for consultation, and agree with documentation and plan.  

## 2018-10-17 NOTE — Assessment & Plan Note (Signed)
Reviewed preventive care protocols, scheduled due services, and updated immunizations Discussed nutrition, exercise, diet, and healthy lifestyle.  Pneumonia vaccine given today.

## 2018-10-17 NOTE — Assessment & Plan Note (Signed)
Well controlled 

## 2018-10-17 NOTE — Assessment & Plan Note (Signed)
Continue pepcid 20 mg twice daily wit has needed carafate.

## 2018-10-17 NOTE — Patient Instructions (Signed)
Great to see you!  Happy New Year!

## 2018-10-17 NOTE — Assessment & Plan Note (Signed)
New- pt has a podiatrist. He will call to make an appointment with her.

## 2018-10-17 NOTE — Assessment & Plan Note (Signed)
Followed by cardiology. Doing well. No changes made today.

## 2018-10-17 NOTE — Patient Instructions (Addendum)
PT DECLINES ANOTHER PRINTED AVS. Handwrote instructions on PCP AVS   Please schedule your next medicare wellness visit with me in 1 yr.  Continue to eat heart healthy diet (full of fruits, vegetables, whole grains, lean protein, water--limit salt, fat, and sugar intake) and increase physical activity as tolerated.  Continue doing brain stimulating activities (puzzles, reading, adult coloring books, staying active) to keep memory sharp.   Bring a copy of your living will and/or healthcare power of attorney to your next office visit.    [name] , Thank you for taking time to come for your Medicare Wellness Visit. I appreciate your ongoing commitment to your health goals. Please review the following plan we discussed and let me know if I can assist you in the future.   These are the goals we discussed: Goals    . DIET - EAT MORE FRUITS AND VEGETABLES    . Increase physical activity     Go to gym at least once per week       This is a list of the screening recommended for you and due dates:  Health Maintenance  Topic Date Due  . Pneumonia vaccines (2 of 2 - PPSV23) 11/11/2015  . Colon Cancer Screening  10/10/2021  . Tetanus Vaccine  02/01/2024  . Flu Shot  Completed    Health Maintenance After Age 77 After age 69, you are at a higher risk for certain long-term diseases and infections as well as injuries from falls. Falls are a major cause of broken bones and head injuries in people who are older than age 35. Getting regular preventive care can help to keep you healthy and well. Preventive care includes getting regular testing and making lifestyle changes as recommended by your health care provider. Talk with your health care provider about:  Which screenings and tests you should have. A screening is a test that checks for a disease when you have no symptoms.  A diet and exercise plan that is right for you. What should I know about screenings and tests to prevent falls? Screening  and testing are the best ways to find a health problem early. Early diagnosis and treatment give you the best chance of managing medical conditions that are common after age 45. Certain conditions and lifestyle choices may make you more likely to have a fall. Your health care provider may recommend:  Regular vision checks. Poor vision and conditions such as cataracts can make you more likely to have a fall. If you wear glasses, make sure to get your prescription updated if your vision changes.  Medicine review. Work with your health care provider to regularly review all of the medicines you are taking, including over-the-counter medicines. Ask your health care provider about any side effects that may make you more likely to have a fall. Tell your health care provider if any medicines that you take make you feel dizzy or sleepy.  Osteoporosis screening. Osteoporosis is a condition that causes the bones to get weaker. This can make the bones weak and cause them to break more easily.  Blood pressure screening. Blood pressure changes and medicines to control blood pressure can make you feel dizzy.  Strength and balance checks. Your health care provider may recommend certain tests to check your strength and balance while standing, walking, or changing positions.  Foot health exam. Foot pain and numbness, as well as not wearing proper footwear, can make you more likely to have a fall.  Depression screening. You  may be more likely to have a fall if you have a fear of falling, feel emotionally low, or feel unable to do activities that you used to do.  Alcohol use screening. Using too much alcohol can affect your balance and may make you more likely to have a fall. What actions can I take to lower my risk of falls? General instructions  Talk with your health care provider about your risks for falling. Tell your health care provider if: ? You fall. Be sure to tell your health care provider about all  falls, even ones that seem minor. ? You feel dizzy, sleepy, or off-balance.  Take over-the-counter and prescription medicines only as told by your health care provider. These include any supplements.  Eat a healthy diet and maintain a healthy weight. A healthy diet includes low-fat dairy products, low-fat (lean) meats, and fiber from whole grains, beans, and lots of fruits and vegetables. Home safety  Remove any tripping hazards, such as rugs, cords, and clutter.  Install safety equipment such as grab bars in bathrooms and safety rails on stairs.  Keep rooms and walkways well-lit. Activity   Follow a regular exercise program to stay fit. This will help you maintain your balance. Ask your health care provider what types of exercise are appropriate for you.  If you need a cane or walker, use it as recommended by your health care provider.  Wear supportive shoes that have nonskid soles. Lifestyle  Do not drink alcohol if your health care provider tells you not to drink.  If you drink alcohol, limit how much you have: ? 0-1 drink a day for women. ? 0-2 drinks a day for men.  Be aware of how much alcohol is in your drink. In the [place] one drink equals one typical bottle of beer (12 oz), one-half glass of wine (5 oz), or one shot of hard liquor (1 oz).  Do not use any products that contain nicotine or tobacco, such as cigarettes and e-cigarettes. If you need help quitting, ask your health care provider. Summary  Having a healthy lifestyle and getting preventive care can help to protect your health and wellness after age 31.  Screening and testing are the best way to find a health problem early and help you avoid having a fall. Early diagnosis and treatment give you the best chance for managing medical conditions that are more common for people who are older than age 50.  Falls are a major cause of broken bones and head injuries in people who are older than age 73. Take precautions to  prevent a fall at home.  Work with your health care provider to learn what changes you can make to improve your health and wellness and to prevent falls. This information is not intended to replace advice given to you by your health care provider. Make sure you discuss any questions you have with your health care provider. Document Released: 08/09/2017 Document Revised: 08/09/2017 Document Reviewed: 08/09/2017 Elsevier Interactive Patient Education  2019 [employer].

## 2019-09-25 ENCOUNTER — Ambulatory Visit: Payer: Self-pay

## 2019-09-25 DIAGNOSIS — H539 Unspecified visual disturbance: Secondary | ICD-10-CM

## 2019-09-25 NOTE — Telephone Encounter (Signed)
  Pt. Reports Friday he noticed "flashing light and brief blurred vision in my right eye.Comes and goes." Reports he saw his optometrist Monday and he was not having these symptoms. The symptoms  Have come back. He left a message for the optometrist, but has not heard back. Feels like he needs a referral to an eye doctor. Reports he can see out of his eye.Denies any headache or other symptom. Warm transfer to [facility] in the practice for a visit. Reason for Disposition . Flashes of light  (Exception: brief from pressing on the eyeball)  Answer Assessment - Initial Assessment Questions 1. DESCRIPTION: "What is the vision loss like? Describe it for me." (e.g., complete vision loss, blurred vision, double vision, floaters, etc.)     Sometimes blurry 2. LOCATION: "One or both eyes?" If one, ask: "Which eye?"     Right eye 3. SEVERITY: "Can you see anything?" If so, ask: "What can you see?" (e.g., fine print)     Can see 4. ONSET: "When did this begin?" "Did it start suddenly or has this been gradual?"     Last Friday 5. PATTERN: "Does this come and go, or has it been constant since it started?"     Comes and goes 6. PAIN: "Is there any pain in your eye(s)?"  (Scale 1-10; or mild, moderate, severe)     No pain, but mild pressure 7. CONTACTS-GLASSES: "Do you wear contacts or glasses?"     Glasses 8. CAUSE: "What do you think is causing this visual problem?"     Unsure 9. OTHER SYMPTOMS: "Do you have any other symptoms?" (e.g., confusion, headache, arm or leg weakness, speech problems)     No 10. PREGNANCY: "Is there any chance you are pregnant?" "When was your last menstrual period?"       n/a  Protocols used: [place]

## 2019-09-25 NOTE — Telephone Encounter (Signed)
Pt aware that he is being referred to Ophthalmology/thx dmf

## 2019-09-25 NOTE — Telephone Encounter (Signed)
Agree he needs to be seen.  Could be optical migraines but I cannot know for sure.  Will refer urgently to optho.

## 2019-11-08 NOTE — Assessment & Plan Note (Addendum)
Reviewed preventive care protocols, scheduled due services, and updated immunizations Discussed nutrition, exercise, diet, and healthy lifestyle.  Has already received his first Hurricane.

## 2019-11-08 NOTE — Progress Notes (Signed)
Virtual Visit via Video   Due to the COVID-19 pandemic, this visit was completed with telemedicine (audio/video) technology to reduce patient and provider exposure as well as to preserve personal protective equipment.   I connected with Gerald Shelton by a video enabled telemedicine application and verified that I am speaking with the correct person using two identifiers. Location patient: Home Location provider: [facility], Office Persons participating in the virtual visit: Gerald Shelton, Arnette Norris, MD   I discussed the limitations of evaluation and management by telemedicine and the availability of in person appointments. The patient expressed understanding and agreed to proceed.  Care Team   Patient Care Team: Lucille Passy, MD as PCP - General (Family Medicine) Constance Haw, MD as PCP - Electrophysiology (Cardiology) Mauri Pole, MD as Consulting Physician (Gastroenterology) Rosemary Holms, DPM as Consulting Physician (Podiatry) Kathrene Bongo, DMD (Dentistry)  Subjective:   HPI:  Connected via tele visit for annual exam and follow up of chronic medical conditions. Patient is scheduled for fasting labs on Wednesday 2.3.2021.   Health Maintenance  Topic Date Due  . TETANUS/TDAP  02/01/2024  . INFLUENZA VACCINE  Completed  . PNA vac Low Risk Adult  Completed   See problem based charting  Review of Systems  Constitutional: Negative for fever and malaise/fatigue.  HENT: Negative for congestion and hearing loss.   Eyes: Negative.  Negative for blurred vision, discharge and redness.  Respiratory: Negative.  Negative for cough and shortness of breath.   Cardiovascular: Negative.  Negative for chest pain, palpitations and leg swelling.  Gastrointestinal: Negative.  Negative for abdominal pain and heartburn.  Genitourinary: Negative.  Negative for dysuria.  Musculoskeletal: Negative.  Negative for falls.  Skin: Negative for rash.  Neurological: Negative.   Negative for loss of consciousness and headaches.  Endo/Heme/Allergies: Negative.  Does not bruise/bleed easily.  Psychiatric/Behavioral: Negative.  Negative for depression and memory loss.  All other systems reviewed and are negative.    Patient Active Problem List   Diagnosis Date Noted  . Visit for well man health check 10/16/2018  . APC (atrial premature contractions) 08/14/2018  . GERD (gastroesophageal reflux disease) 08/14/2018  . HLD (hyperlipidemia) 08/14/2018  . GAD (generalized anxiety disorder) 08/14/2018  . 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    Social History   Tobacco Use  . Smoking status: Former Smoker    Quit date: 10/25/1998    Years since quitting: 21.0  . Smokeless tobacco: Never Used  Substance Use Topics  . Alcohol use: Yes    Comment: RARE    Current Outpatient Medications:  .  famotidine (PEPCID) 20 MG tablet, Take 20 mg by mouth 2 (two) times daily., Disp: , Rfl:  .  Omega-3 Fatty Acids (FISH OIL) 1200 MG CPDR, Take 1 capsule by mouth daily., Disp: , Rfl:   Allergies  Allergen Reactions  . Augmentin [Amoxicillin-Pot Clavulanate] Other (See Comments)    GI UPSET  . Simvastatin Other (See Comments)    GI UPSET    Objective:  There were no vitals taken for this visit.  VITALS: Per patient if applicable, see vitals. GENERAL: Alert, appears well and in no acute distress. HEENT: Atraumatic, conjunctiva clear, no obvious abnormalities on inspection of external nose and ears. NECK: Normal movements of the head and neck. CARDIOPULMONARY: No increased WOB. Speaking in clear sentences. I:E ratio WNL.  MS: Moves all visible extremities without noticeable abnormality. PSYCH: Pleasant and cooperative, well-groomed. Speech normal rate and rhythm.  Affect is appropriate. Insight and judgement are appropriate. Attention is focused, linear, and appropriate.  NEURO: CN grossly intact. Oriented as arrived to appointment on time with no prompting.  Moves both UE equally.  SKIN: No obvious lesions, wounds, erythema, or cyanosis noted on face or hands.  Depression screen [facility] 2/9 11/11/2019 10/17/2018 10/17/2018  Decreased Interest 0 0 0  Down, Depressed, Hopeless 0 0 0  PHQ - 2 Score 0 0 0     . COVID-19 Education: The signs and symptoms of COVID-19 were discussed with the patient and how to seek care for testing if needed. The importance of social distancing was discussed today. . Reviewed expectations re: course of current medical issues. . Discussed self-management of symptoms. . Outlined signs and symptoms indicating need for more acute intervention. . Patient verbalized understanding and all questions were answered. Marland Kitchen Health Maintenance issues including appropriate healthy diet, exercise, and smoking avoidance were discussed with patient. . See orders for this visit as documented in the electronic medical record.  Arnette Norris, MD   Lab Results  Component Value Date   WBC 6.3 08/14/2018   HGB 14.6 08/14/2018   HCT 43.2 08/14/2018   PLT 191.0 08/14/2018   GLUCOSE 99 08/14/2018   CHOL 203 (H) 08/14/2018   TRIG 228.0 (H) 08/14/2018   HDL 43.40 08/14/2018   LDLDIRECT 127.0 08/14/2018   ALT 17 08/14/2018   AST 17 08/14/2018   NA 140 08/14/2018   K 4.2 08/14/2018   CL 103 08/14/2018   CREATININE 0.97 08/14/2018   BUN 20 08/14/2018   CO2 31 08/14/2018   PSA 0.62 08/14/2018    No results found for: TSH Lab Results  Component Value Date   WBC 6.3 08/14/2018   HGB 14.6 08/14/2018   HCT 43.2 08/14/2018   MCV 91.3 08/14/2018   PLT 191.0 08/14/2018   Lab Results  Component Value Date   NA 140 08/14/2018   K 4.2 08/14/2018   CO2 31 08/14/2018   GLUCOSE 99 08/14/2018   BUN 20 08/14/2018   CREATININE 0.97 08/14/2018   BILITOT 0.4 08/14/2018   ALKPHOS 61 08/14/2018   AST 17 08/14/2018   ALT 17 08/14/2018   PROT 6.5 08/14/2018   ALBUMIN 4.1 08/14/2018   CALCIUM 9.3 08/14/2018   GFR 80.08 08/14/2018   Lab Results   Component Value Date   CHOL 203 (H) 08/14/2018   Lab Results  Component Value Date   HDL 43.40 08/14/2018   No results found for: [facility] Lab Results  Component Value Date   TRIG 228.0 (H) 08/14/2018   Lab Results  Component Value Date   CHOLHDL 5 08/14/2018   No results found for: HGBA1C     Assessment & Plan:   Problem List Items Addressed This Visit      Active Problems   HLD (hyperlipidemia) - Primary    Well controlled on current regimen. LDL is at goal on current statin dose, patient has no side effects from medication and LFTS are normal. No changes today.  Lab Results  Component Value Date   CHOL 203 (H) 08/14/2018   HDL 43.40 08/14/2018   LDLDIRECT 127.0 08/14/2018   TRIG 228.0 (H) 08/14/2018   CHOLHDL 5 08/14/2018   Due for labs.  Future orders entered.       Relevant Orders   Comp Met (CMET)   Lipid panel   GAD (generalized anxiety disorder)   Relevant Orders   CBC w/Diff   Visit for well  man health check    Reviewed preventive care protocols, scheduled due services, and updated immunizations Discussed nutrition, exercise, diet, and healthy lifestyle.        Other Visit Diagnoses    Screening for prostate cancer       Relevant Orders   PSA, Medicare ( [place] only)      I have discontinued Ragnar Carandang's Multi For Him 50+ and sucralfate. I am also having him maintain his Fish Oil and famotidine.  No orders of the defined types were placed in this encounter.    Arnette Norris, MD

## 2019-11-08 NOTE — Assessment & Plan Note (Signed)
Well controlled on current regimen. LDL is at goal on current statin dose, patient has no side effects from medication and LFTS are normal. No changes today.  Lab Results  Component Value Date   CHOL 203 (H) 08/14/2018   HDL 43.40 08/14/2018   LDLDIRECT 127.0 08/14/2018   TRIG 228.0 (H) 08/14/2018   CHOLHDL 5 08/14/2018   Due for labs.  Future orders entered.

## 2019-11-11 ENCOUNTER — Telehealth (INDEPENDENT_AMBULATORY_CARE_PROVIDER_SITE_OTHER): Payer: Medicare HMO | Admitting: Family Medicine

## 2019-11-11 DIAGNOSIS — Z0000 Encounter for general adult medical examination without abnormal findings: Secondary | ICD-10-CM

## 2019-11-11 DIAGNOSIS — E785 Hyperlipidemia, unspecified: Secondary | ICD-10-CM | POA: Diagnosis not present

## 2019-11-11 DIAGNOSIS — F411 Generalized anxiety disorder: Secondary | ICD-10-CM | POA: Diagnosis not present

## 2019-11-11 DIAGNOSIS — Z125 Encounter for screening for malignant neoplasm of prostate: Secondary | ICD-10-CM | POA: Diagnosis not present

## 2019-11-12 ENCOUNTER — Other Ambulatory Visit: Payer: Self-pay

## 2019-11-13 ENCOUNTER — Other Ambulatory Visit (INDEPENDENT_AMBULATORY_CARE_PROVIDER_SITE_OTHER): Payer: Medicare HMO

## 2019-11-13 DIAGNOSIS — Z125 Encounter for screening for malignant neoplasm of prostate: Secondary | ICD-10-CM | POA: Diagnosis not present

## 2019-11-13 DIAGNOSIS — E785 Hyperlipidemia, unspecified: Secondary | ICD-10-CM | POA: Diagnosis not present

## 2019-11-13 DIAGNOSIS — F411 Generalized anxiety disorder: Secondary | ICD-10-CM

## 2019-11-13 LAB — CBC WITH DIFFERENTIAL/PLATELET
Basophils Absolute: 0.1 10*3/uL (ref 0.0–0.1)
Basophils Relative: 0.8 % (ref 0.0–3.0)
Eosinophils Absolute: 0.5 10*3/uL (ref 0.0–0.7)
Eosinophils Relative: 6.8 % — ABNORMAL HIGH (ref 0.0–5.0)
HCT: 46.4 % (ref 39.0–52.0)
Hemoglobin: 15.2 g/dL (ref 13.0–17.0)
Lymphocytes Relative: 23.6 % (ref 12.0–46.0)
Lymphs Abs: 1.6 10*3/uL (ref 0.7–4.0)
MCHC: 32.7 g/dL (ref 30.0–36.0)
MCV: 91.6 fl (ref 78.0–100.0)
Monocytes Absolute: 0.6 10*3/uL (ref 0.1–1.0)
Monocytes Relative: 9.1 % (ref 3.0–12.0)
Neutro Abs: 4.1 10*3/uL (ref 1.4–7.7)
Neutrophils Relative %: 59.7 % (ref 43.0–77.0)
Platelets: 195 10*3/uL (ref 150.0–400.0)
RBC: 5.06 Mil/uL (ref 4.22–5.81)
RDW: 13.9 % (ref 11.5–15.5)
WBC: 6.9 10*3/uL (ref 4.0–10.5)

## 2019-11-13 LAB — LIPID PANEL
Cholesterol: 238 mg/dL — ABNORMAL HIGH (ref 0–200)
HDL: 39.2 mg/dL (ref >39.00)
NonHDL: 198.48
Total CHOL/HDL Ratio: 6
Triglycerides: 240 mg/dL — ABNORMAL HIGH (ref 0.0–149.0)
VLDL: 48 mg/dL — ABNORMAL HIGH (ref 0.0–40.0)

## 2019-11-13 LAB — COMPREHENSIVE METABOLIC PANEL
ALT: 15 U/L (ref 0–53)
AST: 15 U/L (ref 0–37)
Albumin: 4 g/dL (ref 3.5–5.2)
Alkaline Phosphatase: 66 U/L (ref 39–117)
BUN: 17 mg/dL (ref 6–23)
CO2: 32 mEq/L (ref 19–32)
Calcium: 8.9 mg/dL (ref 8.4–10.5)
Chloride: 105 mEq/L (ref 96–112)
Creatinine, Ser: 0.98 mg/dL (ref 0.40–1.50)
GFR: 74.21 mL/min (ref >60.00)
Glucose, Bld: 94 mg/dL (ref 70–99)
Potassium: 4.1 mEq/L (ref 3.5–5.1)
Sodium: 142 mEq/L (ref 135–145)
Total Bilirubin: 0.4 mg/dL (ref 0.2–1.2)
Total Protein: 6.6 g/dL (ref 6.0–8.3)

## 2019-11-13 LAB — LDL CHOLESTEROL, DIRECT: Direct LDL: 150 mg/dL

## 2019-11-13 LAB — PSA, MEDICARE: PSA: 0.69 ng/ml (ref 0.10–4.00)

## 2020-03-18 ENCOUNTER — Other Ambulatory Visit: Payer: Self-pay

## 2020-03-19 ENCOUNTER — Ambulatory Visit (INDEPENDENT_AMBULATORY_CARE_PROVIDER_SITE_OTHER): Payer: Medicare HMO | Admitting: Family Medicine

## 2020-03-19 ENCOUNTER — Encounter: Payer: Self-pay | Admitting: Family Medicine

## 2020-03-19 VITALS — BP 114/76 | HR 76 | Temp 97.9°F | Ht 70.0 in | Wt 207.0 lb

## 2020-03-19 DIAGNOSIS — M722 Plantar fascial fibromatosis: Secondary | ICD-10-CM | POA: Diagnosis not present

## 2020-03-19 DIAGNOSIS — G629 Polyneuropathy, unspecified: Secondary | ICD-10-CM

## 2020-03-19 DIAGNOSIS — K219 Gastro-esophageal reflux disease without esophagitis: Secondary | ICD-10-CM | POA: Diagnosis not present

## 2020-03-19 NOTE — Progress Notes (Signed)
Established Patient Office Visit  Subjective:  Patient ID: Gerald Shelton, male    DOB: 08/18/1941  Age: 79 y.o. MRN: [id number]  CC:  Chief Complaint  Patient presents with   Transitions Of Care    Patient is here today to transfer care from Dr. Deborra Medina to Dr. Ethelene Hal. Colonoscopy UTD next due 2023. He denies any issues but just wanted to become aquainted.    HPI Gerald Shelton presents for evaluation of numbness and tingling in his feet.  Ongoing issues with his low back.  These have responded to physical therapy.  Back pain is not radiating down into the legs.  There is no loss of strength numbness or tingling in the crotch area or change in bowel bladder habit.  He is not a diabetic.  History of plantar fasciitis that is currently bothering him some.  He has purchased shoes and inserts from the [employer] store.  Past Medical History:  Diagnosis Date   A-fib ([place]) 10/25/2016   Anxiety attack    Atrial premature complexes    Cancer ([place])    SKIN, ON FACE   Chest pain 10/25/2016   ED (erectile dysfunction)    GERD (gastroesophageal reflux disease)    Hay fever    Hyperlipidemia    Irregular heartbeat    PUD (peptic ulcer disease)    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    Past Surgical History:  Procedure Laterality Date   HERNIA REPAIR     x2   NASAL SEPTUM SURGERY      Family History  Problem Relation Age of Onset   Diabetes Son    Parkinson's disease Mother    Depression Mother    Dementia Mother     Social History   Socioeconomic History   Marital status: Married    Spouse name: Not on file   Number of children: 6   Years of education: Not on file   Highest education level: Not on file  Occupational History   Occupation: Retired  Tobacco Use   Smoking status: Former Smoker    Quit date: 10/25/1998    Years since quitting: 21.4   Smokeless tobacco: Never Used  Scientific laboratory technician Use: Never used  Substance and Sexual  Activity   Alcohol use: Yes    Comment: RARE   Drug use: No   Sexual activity: Not Currently  Other Topics Concern   Not on file  Social History Narrative   Not on file   Social Determinants of Health   Financial Resource Strain:    Difficulty of Paying Living Expenses:   Food Insecurity:    Worried About Charity fundraiser in the Last Year:    Arboriculturist in the Last Year:   Transportation Needs:    Film/video editor (Medical):    Lack of Transportation (Non-Medical):   Physical Activity:    Days of Exercise per Week:    Minutes of Exercise per Session:   Stress:    Feeling of Stress :   Social Connections:    Frequency of Communication with Friends and Family:    Frequency of Social Gatherings with Friends and Family:    Attends Religious Services:    Active Member of Clubs or Organizations:    Attends Archivist Meetings:    Marital Status:   Intimate Partner Violence:    Fear of Current or Ex-Partner:    Emotionally Abused:    Physically  Abused:    Sexually Abused:     Outpatient Medications Prior to Visit  Medication Sig Dispense Refill   famotidine (PEPCID) 20 MG tablet Take 20 mg by mouth 2 (two) times daily.     Omega-3 Fatty Acids (FISH OIL) 1200 MG CPDR Take 1 capsule by mouth daily.     No facility-administered medications prior to visit.    Allergies  Allergen Reactions   Augmentin [Amoxicillin-Pot Clavulanate] Other (See Comments)    GI UPSET   Simvastatin Other (See Comments)    GI UPSET    ROS Review of Systems  Constitutional: Negative.   HENT: Negative.   Respiratory: Negative.   Cardiovascular: Negative.   Gastrointestinal: Negative.  Negative for constipation and diarrhea.  Musculoskeletal: Negative for gait problem.  Skin: Negative for pallor and rash.  Allergic/Immunologic: Negative for immunocompromised state.  Neurological: Positive for numbness. Negative for weakness.   Hematological: Does not bruise/bleed easily.      Objective:    Physical Exam Constitutional:      General: He is not in acute distress.    Appearance: Normal appearance. He is not ill-appearing, toxic-appearing or diaphoretic.  HENT:     Head: Normocephalic and atraumatic.     Right Ear: External ear normal.     Left Ear: External ear normal.  Eyes:     Extraocular Movements: Extraocular movements intact.     Conjunctiva/sclera: Conjunctivae normal.  Cardiovascular:     Pulses:          Dorsalis pedis pulses are 1+ on the right side and 1+ on the left side.       Posterior tibial pulses are 1+ on the right side and 1+ on the left side.  Pulmonary:     Effort: Pulmonary effort is normal.  Musculoskeletal:       Legs:  Skin:    General: Skin is warm and dry.  Neurological:     Mental Status: He is alert and oriented to person, place, and time.  Psychiatric:        Mood and Affect: Mood normal.        Behavior: Behavior normal.     BP 114/76 (BP Location: Left Arm, Patient Position: Sitting, Cuff Size: Normal)    Pulse 76    Temp 97.9 F (36.6 C) (Temporal)    Ht 5\' 10"  (1.778 m)    Wt 207 lb (93.9 kg)    SpO2 94%    BMI 29.70 kg/m  Wt Readings from Last 3 Encounters:  03/19/20 207 lb (93.9 kg)  10/17/18 205 lb 3.2 oz (93.1 kg)  10/17/18 205 lb 3.2 oz (93.1 kg)     Health Maintenance Due  Topic Date Due   Hepatitis C Screening  Never done   COVID-19 Vaccine (2 - Moderna 2-dose series) 11/26/2019    There are no preventive care reminders to display for this patient.  No results found for: TSH Lab Results  Component Value Date   WBC 6.9 11/13/2019   HGB 15.2 11/13/2019   HCT 46.4 11/13/2019   MCV 91.6 11/13/2019   PLT 195.0 11/13/2019   Lab Results  Component Value Date   NA 142 11/13/2019   K 4.1 11/13/2019   CO2 32 11/13/2019   GLUCOSE 94 11/13/2019   BUN 17 11/13/2019   CREATININE 0.98 11/13/2019   BILITOT 0.4 11/13/2019   ALKPHOS 66  11/13/2019   AST 15 11/13/2019   ALT 15 11/13/2019   PROT 6.6 11/13/2019  ALBUMIN 4.0 11/13/2019   CALCIUM 8.9 11/13/2019   GFR 74.21 11/13/2019   Lab Results  Component Value Date   CHOL 238 (H) 11/13/2019   Lab Results  Component Value Date   HDL 39.20 11/13/2019   No results found for: [facility] Lab Results  Component Value Date   TRIG 240.0 (H) 11/13/2019   Lab Results  Component Value Date   CHOLHDL 6 11/13/2019   No results found for: HGBA1C    Assessment & Plan:   Problem List Items Addressed This Visit      Digestive   Gastroesophageal reflux disease without esophagitis - Primary     Nervous and Auditory   Neuropathy   Relevant Orders   TSH   Vitamin B12     Musculoskeletal and Integument   Plantar fasciitis, bilateral      No orders of the defined types were placed in this encounter.   Follow-up: Return for cholesterol follow up.  Continue Pepcid as needed for reflux.  Consider podiatry fascia.  Libby Maw, MD

## 2020-03-19 NOTE — Patient Instructions (Addendum)
Plantar Fasciitis  Plantar fasciitis is a painful foot condition that affects the heel. It occurs when the band of tissue that connects the toes to the heel bone (plantar fascia) becomes irritated. This can happen as the result of exercising too much or doing other repetitive activities (overuse injury). The pain from plantar fasciitis can range from mild irritation to severe pain that makes it difficult to walk or move. The pain is usually worse in the morning after sleeping, or after sitting or lying down for a while. Pain may also be worse after long periods of walking or standing. What are the causes? This condition may be caused by:  Standing for long periods of time.  Wearing shoes that do not have good arch support.  Doing activities that put stress on joints (high-impact activities), including running, aerobics, and ballet.  Being overweight.  An abnormal way of walking (gait).  Tight muscles in the back of your lower leg (calf).  High arches in your feet.  Starting a new athletic activity. What are the signs or symptoms? The main symptom of this condition is heel pain. Pain may:  Be worse with first steps after a time of rest, especially in the morning after sleeping or after you have been sitting or lying down for a while.  Be worse after long periods of standing still.  Decrease after 30-45 minutes of activity, such as gentle walking. How is this diagnosed? This condition may be diagnosed based on your medical history and your symptoms. Your health care provider may ask questions about your activity level. Your health care provider will do a physical exam to check for:  A tender area on the bottom of your foot.  A high arch in your foot.  Pain when you move your foot.  Difficulty moving your foot. You may have imaging tests to confirm the diagnosis, such as:  X-rays.  Ultrasound.  MRI. How is this treated? Treatment for plantar fasciitis depends on how  severe your condition is. Treatment may include:  Rest, ice, applying pressure (compression), and raising the affected foot (elevation). This may be called RICE therapy. Your health care provider may recommend RICE therapy along with over-the-counter pain medicines to manage your pain.  Exercises to stretch your calves and your plantar fascia.  A splint that holds your foot in a stretched, upward position while you sleep (night splint).  Physical therapy to relieve symptoms and prevent problems in the future.  Injections of steroid medicine (cortisone) to relieve pain and inflammation.  Stimulating your plantar fascia with electrical impulses (extracorporeal shock wave therapy). This is usually the last treatment option before surgery.  Surgery, if other treatments have not worked after 12 months. Follow these instructions at home:  Managing pain, stiffness, and swelling  If directed, put ice on the painful area: ? Put ice in a plastic bag, or use a frozen bottle of water. ? Place a towel between your skin and the bag or bottle. ? Roll the bottom of your foot over the bag or bottle. ? Do this for 20 minutes, 2-3 times a day.  Wear athletic shoes that have air-sole or gel-sole cushions, or try wearing soft shoe inserts that are designed for plantar fasciitis.  Raise (elevate) your foot above the level of your heart while you are sitting or lying down. Activity  Avoid activities that cause pain. Ask your health care provider what activities are safe for you.  Do physical therapy exercises and stretches as told   by your health care provider.  Try activities and forms of exercise that are easier on your joints (low-impact). Examples include swimming, water aerobics, and biking. General instructions  Take over-the-counter and prescription medicines only as told by your health care provider.  Wear a night splint while sleeping, if told by your health care provider. Loosen the splint  if your toes tingle, become numb, or turn cold and blue.  Maintain a healthy weight, or work with your health care provider to lose weight as needed.  Keep all follow-up visits as told by your health care provider. This is important. Contact a health care provider if you:  Have symptoms that do not go away after caring for yourself at home.  Have pain that gets worse.  Have pain that affects your ability to move or do your daily activities. Summary  Plantar fasciitis is a painful foot condition that affects the heel. It occurs when the band of tissue that connects the toes to the heel bone (plantar fascia) becomes irritated.  The main symptom of this condition is heel pain that may be worse after exercising too much or standing still for a long time.  Treatment varies, but it usually starts with rest, ice, compression, and elevation (RICE therapy) and over-the-counter medicines to manage pain. This information is not intended to replace advice given to you by your health care provider. Make sure you discuss any questions you have with your health care provider. Document Revised: 09/08/2017 Document Reviewed: 07/24/2017 Elsevier Patient Education  2020 [employer]  

## 2020-03-23 ENCOUNTER — Other Ambulatory Visit: Payer: Self-pay

## 2020-03-23 ENCOUNTER — Other Ambulatory Visit (INDEPENDENT_AMBULATORY_CARE_PROVIDER_SITE_OTHER): Payer: Medicare HMO

## 2020-03-23 DIAGNOSIS — G629 Polyneuropathy, unspecified: Secondary | ICD-10-CM | POA: Diagnosis not present

## 2020-03-23 DIAGNOSIS — E538 Deficiency of other specified B group vitamins: Secondary | ICD-10-CM

## 2020-03-23 LAB — VITAMIN B12: Vitamin B-12: 201 pg/mL — ABNORMAL LOW (ref 211–911)

## 2020-03-23 LAB — TSH: TSH: 1.59 u[IU]/mL (ref 0.35–4.50)

## 2020-03-24 DIAGNOSIS — E538 Deficiency of other specified B group vitamins: Secondary | ICD-10-CM | POA: Insufficient documentation

## 2020-03-24 MED ORDER — CYANOCOBALAMIN 500 MCG PO TABS: 500.0000 ug | ORAL_TABLET | Freq: Every day | ORAL | 1 refills | Status: DC

## 2020-03-24 NOTE — Progress Notes (Signed)
Established Patient Office Visit  Subjective:  Patient ID: Gerald Shelton, male    DOB: [birth year]  Age: 79 y.o. MRN: [id number]  CC:  Chief Complaint  Patient presents with  . Labs Only    HPI Gerald Shelton presents for low b12   Past Medical History:  Diagnosis Date  . A-fib ([place]) 10/25/2016  . Anxiety attack   . Atrial premature complexes   . Cancer ([place])    SKIN, ON FACE  . Chest pain 10/25/2016  . ED (erectile dysfunction)   . GERD (gastroesophageal reflux disease)   . Hay fever   . Hyperlipidemia   . Irregular heartbeat   . PUD (peptic ulcer disease)   . 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    Past Surgical History:  Procedure Laterality Date  . HERNIA REPAIR     x2  . NASAL SEPTUM SURGERY      Family History  Problem Relation Age of Onset  . Diabetes Son   . Parkinson's disease Mother   . Depression Mother   . Dementia Mother     Social History   Socioeconomic History  . Marital status: Married    Spouse name: Not on file  . Number of children: 6  . Years of education: Not on file  . Highest education level: Not on file  Occupational History  . Occupation: Retired  Tobacco Use  . Smoking status: Former Smoker    Quit date: 10/25/1998    Years since quitting: 21.4  . Smokeless tobacco: Never Used  Vaping Use  . Vaping Use: Never used  Substance and Sexual Activity  . Alcohol use: Yes    Comment: RARE  . Drug use: No  . Sexual activity: Not Currently  Other Topics Concern  . Not on file  Social History Narrative  . Not on file   Social Determinants of Health   Financial Resource Strain:   . Difficulty of Paying Living Expenses:   Food Insecurity:   . Worried About Charity fundraiser in the Last Year:   . Arboriculturist in the Last Year:   Transportation Needs:   . Film/video editor (Medical):   Marland Kitchen Lack of Transportation (Non-Medical):   Physical Activity:   . Days of Exercise per Week:   . Minutes of Exercise per  Session:   Stress:   . Feeling of Stress :   Social Connections:   . Frequency of Communication with Friends and Family:   . Frequency of Social Gatherings with Friends and Family:   . Attends Religious Services:   . Active Member of Clubs or Organizations:   . Attends Archivist Meetings:   Marland Kitchen Marital Status:   Intimate Partner Violence:   . Fear of Current or Ex-Partner:   . Emotionally Abused:   Marland Kitchen Physically Abused:   . Sexually Abused:     Outpatient Medications Prior to Visit  Medication Sig Dispense Refill  . famotidine (PEPCID) 20 MG tablet Take 20 mg by mouth 2 (two) times daily.    . Omega-3 Fatty Acids (FISH OIL) 1200 MG CPDR Take 1 capsule by mouth daily.     No facility-administered medications prior to visit.    Allergies  Allergen Reactions  . Augmentin [Amoxicillin-Pot Clavulanate] Other (See Comments)    GI UPSET  . Simvastatin Other (See Comments)    GI UPSET    ROS Review of Systems    Objective:    Physical  Exam  There were no vitals taken for this visit. Wt Readings from Last 3 Encounters:  03/19/20 207 lb (93.9 kg)  10/17/18 205 lb 3.2 oz (93.1 kg)  10/17/18 205 lb 3.2 oz (93.1 kg)     Health Maintenance Due  Topic Date Due  . Hepatitis C Screening  Never done  . COVID-19 Vaccine (2 - Moderna 2-dose series) 11/26/2019    There are no preventive care reminders to display for this patient.  Lab Results  Component Value Date   TSH 1.59 03/23/2020   Lab Results  Component Value Date   WBC 6.9 11/13/2019   HGB 15.2 11/13/2019   HCT 46.4 11/13/2019   MCV 91.6 11/13/2019   PLT 195.0 11/13/2019   Lab Results  Component Value Date   NA 142 11/13/2019   K 4.1 11/13/2019   CO2 32 11/13/2019   GLUCOSE 94 11/13/2019   BUN 17 11/13/2019   CREATININE 0.98 11/13/2019   BILITOT 0.4 11/13/2019   ALKPHOS 66 11/13/2019   AST 15 11/13/2019   ALT 15 11/13/2019   PROT 6.6 11/13/2019   ALBUMIN 4.0 11/13/2019   CALCIUM 8.9  11/13/2019   GFR 74.21 11/13/2019   Lab Results  Component Value Date   CHOL 238 (H) 11/13/2019   Lab Results  Component Value Date   HDL 39.20 11/13/2019   No results found for: [facility] Lab Results  Component Value Date   TRIG 240.0 (H) 11/13/2019   Lab Results  Component Value Date   CHOLHDL 6 11/13/2019   No results found for: HGBA1C    Assessment & Plan:   Problem List Items Addressed This Visit      Nervous and Auditory   Neuropathy   Relevant Medications   vitamin B-12 (CYANOCOBALAMIN) 500 MCG tablet     Other   B12 deficiency - Primary   Relevant Medications   vitamin B-12 (CYANOCOBALAMIN) 500 MCG tablet      Meds ordered this encounter  Medications  . vitamin B-12 (CYANOCOBALAMIN) 500 MCG tablet    Sig: Take 1 tablet (500 mcg total) by mouth daily.    Dispense:  90 tablet    Refill:  1    Follow-up: No follow-ups on file.    Libby Maw, MD

## 2020-03-24 NOTE — Addendum Note (Signed)
Addended by: Abelino Derrick A on: 03/24/2020 08:00 AM   Modules accepted: Orders

## 2020-04-29 ENCOUNTER — Ambulatory Visit: Payer: Medicare HMO

## 2020-05-06 ENCOUNTER — Ambulatory Visit (INDEPENDENT_AMBULATORY_CARE_PROVIDER_SITE_OTHER): Payer: Medicare HMO

## 2020-05-06 VITALS — Ht 70.0 in | Wt 207.0 lb

## 2020-05-06 DIAGNOSIS — Z0000 Encounter for general adult medical examination without abnormal findings: Secondary | ICD-10-CM | POA: Diagnosis not present

## 2020-05-06 NOTE — Patient Instructions (Signed)
Gerald Shelton , Thank you for taking time to come for your Medicare Wellness Visit. I appreciate your ongoing commitment to your health goals. Please review the following plan we discussed and let me know if I can assist you in the future.   Screening recommendations/referrals: Colonoscopy: No longer indicated Recommended yearly ophthalmology/optometry visit for glaucoma screening and checkup Recommended yearly dental visit for hygiene and checkup  Vaccinations: Influenza vaccine: Up to Date- Due 06/2020 Pneumococcal vaccine: Completed vacicnes Tdap vaccine: Up to Date- Due 02/01/2024 Shingles vaccine: Completed vaccines   Covid-19: Completed vaccines  Advanced directives: Please bring a copy to your next office visit  Conditions/risks identified: See problem list  Next appointment: Follow up in one year for your annual wellness visit.   Preventive Care 77 Years and Older, Male Preventive care refers to lifestyle choices and visits with your health care provider that can promote health and wellness. What does preventive care include?  A yearly physical exam. This is also called an annual well check.  Dental exams once or twice a year.  Routine eye exams. Ask your health care provider how often you should have your eyes checked.  Personal lifestyle choices, including:  Daily care of your teeth and gums.  Regular physical activity.  Eating a healthy diet.  Avoiding tobacco and drug use.  Limiting alcohol use.  Practicing safe sex.  Taking low doses of aspirin every day.  Taking vitamin and mineral supplements as recommended by your health care provider. What happens during an annual well check? The services and screenings done by your health care provider during your annual well check will depend on your age, overall health, lifestyle risk factors, and family history of disease. Counseling  Your health care provider may ask you questions about your:  Alcohol  use.  Tobacco use.  Drug use.  Emotional well-being.  Home and relationship well-being.  Sexual activity.  Eating habits.  History of falls.  Memory and ability to understand (cognition).  Work and work Statistician. Screening  You may have the following tests or measurements:  Height, weight, and BMI.  Blood pressure.  Lipid and cholesterol levels. These may be checked every 5 years, or more frequently if you are over 95 years old.  Skin check.  Lung cancer screening. You may have this screening every year starting at age 77 if you have a 30-pack-year history of smoking and currently smoke or have quit within the past 15 years.  Fecal occult blood test (FOBT) of the stool. You may have this test every year starting at age 77.  Flexible sigmoidoscopy or colonoscopy. You may have a sigmoidoscopy every 5 years or a colonoscopy every 10 years starting at age 77.  Prostate cancer screening. Recommendations will vary depending on your family history and other risks.  Hepatitis C blood test.  Hepatitis B blood test.  Sexually transmitted disease (STD) testing.  Diabetes screening. This is done by checking your blood sugar (glucose) after you have not eaten for a while (fasting). You may have this done every 1-3 years.  Abdominal aortic aneurysm (AAA) screening. You may need this if you are a current or former smoker.  Osteoporosis. You may be screened starting at age 49 if you are at high risk. Talk with your health care provider about your test results, treatment options, and if necessary, the need for more tests. Vaccines  Your health care provider may recommend certain vaccines, such as:  Influenza vaccine. This is recommended every year.  Tetanus, diphtheria, and acellular pertussis (Tdap, Td) vaccine. You may need a Td booster every 10 years.  Zoster vaccine. You may need this after age 77.  Pneumococcal 13-valent conjugate (PCV13) vaccine. One dose is  recommended after age 15.  Pneumococcal polysaccharide (PPSV23) vaccine. One dose is recommended after age 77. Talk to your health care provider about which screenings and vaccines you need and how often you need them. This information is not intended to replace advice given to you by your health care provider. Make sure you discuss any questions you have with your health care provider. Document Released: 10/23/2015 Document Revised: 06/15/2016 Document Reviewed: 07/28/2015 Elsevier Interactive Patient Education  2017 [place] Prevention in the Home Falls can cause injuries. They can happen to people of all ages. There are many things you can do to make your home safe and to help prevent falls. What can I do on the outside of my home?  Regularly fix the edges of walkways and driveways and fix any cracks.  Remove anything that might make you trip as you walk through a door, such as a raised step or threshold.  Trim any bushes or trees on the path to your home.  Use bright outdoor lighting.  Clear any walking paths of anything that might make someone trip, such as rocks or tools.  Regularly check to see if handrails are loose or broken. Make sure that both sides of any steps have handrails.  Any raised decks and porches should have guardrails on the edges.  Have any leaves, snow, or ice cleared regularly.  Use sand or salt on walking paths during winter.  Clean up any spills in your garage right away. This includes oil or grease spills. What can I do in the bathroom?  Use night lights.  Install grab bars by the toilet and in the tub and shower. Do not use towel bars as grab bars.  Use non-skid mats or decals in the tub or shower.  If you need to sit down in the shower, use a plastic, non-slip stool.  Keep the floor dry. Clean up any water that spills on the floor as soon as it happens.  Remove soap buildup in the tub or shower regularly.  Attach bath mats  securely with double-sided non-slip rug tape.  Do not have throw rugs and other things on the floor that can make you trip. What can I do in the bedroom?  Use night lights.  Make sure that you have a light by your bed that is easy to reach.  Do not use any sheets or blankets that are too big for your bed. They should not hang down onto the floor.  Have a firm chair that has side arms. You can use this for support while you get dressed.  Do not have throw rugs and other things on the floor that can make you trip. What can I do in the kitchen?  Clean up any spills right away.  Avoid walking on wet floors.  Keep items that you use a lot in easy-to-reach places.  If you need to reach something above you, use a strong step stool that has a grab bar.  Keep electrical cords out of the way.  Do not use floor polish or wax that makes floors slippery. If you must use wax, use non-skid floor wax.  Do not have throw rugs and other things on the floor that can make you trip. What can I do  with my stairs?  Do not leave any items on the stairs.  Make sure that there are handrails on both sides of the stairs and use them. Fix handrails that are broken or loose. Make sure that handrails are as long as the stairways.  Check any carpeting to make sure that it is firmly attached to the stairs. Fix any carpet that is loose or worn.  Avoid having throw rugs at the top or bottom of the stairs. If you do have throw rugs, attach them to the floor with carpet tape.  Make sure that you have a light switch at the top of the stairs and the bottom of the stairs. If you do not have them, ask someone to add them for you. What else can I do to help prevent falls?  Wear shoes that:  Do not have high heels.  Have rubber bottoms.  Are comfortable and fit you well.  Are closed at the toe. Do not wear sandals.  If you use a stepladder:  Make sure that it is fully opened. Do not climb a closed  stepladder.  Make sure that both sides of the stepladder are locked into place.  Ask someone to hold it for you, if possible.  Clearly mark and make sure that you can see:  Any grab bars or handrails.  First and last steps.  Where the edge of each step is.  Use tools that help you move around (mobility aids) if they are needed. These include:  Canes.  Walkers.  Scooters.  Crutches.  Turn on the lights when you go into a dark area. Replace any light bulbs as soon as they burn out.  Set up your furniture so you have a clear path. Avoid moving your furniture around.  If any of your floors are uneven, fix them.  If there are any pets around you, be aware of where they are.  Review your medicines with your doctor. Some medicines can make you feel dizzy. This can increase your chance of falling. Ask your doctor what other things that you can do to help prevent falls. This information is not intended to replace advice given to you by your health care provider. Make sure you discuss any questions you have with your health care provider. Document Released: 07/23/2009 Document Revised: 03/03/2016 Document Reviewed: 10/31/2014 Elsevier Interactive Patient Education  2017 [employer].

## 2020-05-06 NOTE — Progress Notes (Signed)
Subjective:   Gerald Shelton is a 79 y.o. male who presents for Medicare Annual/Subsequent preventive examination.  I connected with Gerald Shelton today by telephone and verified that I am speaking with the correct person using two identifiers. Location patient: home Location provider: work Persons participating in the virtual visit: patient, Marine scientist.    I discussed the limitations, risks, security and privacy concerns of performing an evaluation and management service by telephone and the availability of in person appointments. I also discussed with the patient that there may be a patient responsible charge related to this service. The patient expressed understanding and verbally consented to this telephonic visit.    Interactive audio and video telecommunications were attempted between this provider and patient, however failed, due to patient having technical difficulties OR patient did not have access to video capability.  We continued and completed visit with audio only.  Some vital signs may be absent or patient reported.   Time Spent with patient on telephone encounter: 20 minutes  Review of Systems     Cardiac Risk Factors include: advanced age (>43men, >30 women);dyslipidemia;male gender     Objective:    Today's Vitals   05/06/20 1414  Weight: (!) 207 lb (93.9 kg)  Height: 5\' 10"  (1.778 m)   Body mass index is 29.7 kg/m.  Advanced Directives 05/06/2020 10/17/2018  Does Patient Have a Medical Advance Directive? Yes Yes  Type of Paramedic of [place];Living will [place];Living will  Does patient want to make changes to medical advance directive? - No - Patient declined  Copy of [place] in Chart? No - copy requested No - copy requested    Current Medications (verified) Outpatient Encounter Medications as of 05/06/2020  Medication Sig  . Multiple Vitamins-Minerals (ONE-A-DAY 50 PLUS PO) daily.  . Omega-3 Fatty Acids  (FISH OIL) 1200 MG CPDR Take 1 capsule by mouth daily.  . vitamin B-12 (CYANOCOBALAMIN) 500 MCG tablet Take 1 tablet (500 mcg total) by mouth daily.  . [DISCONTINUED] famotidine (PEPCID) 20 MG tablet Take 20 mg by mouth 2 (two) times daily.   No facility-administered encounter medications on file as of 05/06/2020.    Allergies (verified) Augmentin [amoxicillin-pot clavulanate] and Simvastatin   History: Past Medical History:  Diagnosis Date  . A-fib ([place]) 10/25/2016  . Anxiety attack   . Atrial premature complexes   . Cancer ([place])    SKIN, ON FACE  . Chest pain 10/25/2016  . ED (erectile dysfunction)   . GERD (gastroesophageal reflux disease)   . Hay fever   . Hyperlipidemia   . Irregular heartbeat   . PUD (peptic ulcer disease)   . 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   Past Surgical History:  Procedure Laterality Date  . EYE SURGERY     cataract removed form right eye  . HERNIA REPAIR     x2  . NASAL SEPTUM SURGERY     Family History  Problem Relation Age of Onset  . Diabetes Son   . Parkinson's disease Mother   . Depression Mother   . Dementia Mother    Social History   Socioeconomic History  . Marital status: Married    Spouse name: Not on file  . Number of children: 6  . Years of education: Not on file  . Highest education level: Not on file  Occupational History  . Occupation: Retired  Tobacco Use  . Smoking status: Former Smoker    Quit date:  10/25/1998    Years since quitting: 21.5  . Smokeless tobacco: Never Used  Vaping Use  . Vaping Use: Never used  Substance and Sexual Activity  . Alcohol use: Yes    Comment: RARE  . Drug use: No  . Sexual activity: Not Currently  Other Topics Concern  . Not on file  Social History Narrative  . Not on file   Social Determinants of Health   Financial Resource Strain: Low Risk   . Difficulty of Paying Living Expenses: Not hard at all  Food Insecurity: No Food Insecurity  . Worried About  Charity fundraiser in the Last Year: Never true  . Ran Out of Food in the Last Year: Never true  Transportation Needs: No Transportation Needs  . Lack of Transportation (Medical): No  . Lack of Transportation (Non-Medical): No  Physical Activity: Insufficiently Active  . Days of Exercise per Week: 3 days  . Minutes of Exercise per Session: 30 min  Stress: No Stress Concern Present  . Feeling of Stress : Not at all  Social Connections: Moderately Integrated  . Frequency of Communication with Friends and Family: More than three times a week  . Frequency of Social Gatherings with Friends and Family: More than three times a week  . Attends Religious Services: Never  . Active Member of Clubs or Organizations: Yes  . Attends Archivist Meetings: More than 4 times per year  . Marital Status: Married    Tobacco Counseling Counseling given: Not Answered   Clinical Intake:  Pre-visit preparation completed: Yes  Pain : No/denies pain     Nutritional Status: BMI 25 -29 Overweight Nutritional Risks: None Diabetes: No  How often do you need to have someone help you when you read instructions, pamphlets, or other written materials from your doctor or pharmacy?: 1 - Never What is the last grade level you completed in school?: 2 yrs ofcollege  Diabetic?No  Interpreter Needed?: No  Information entered by :: Caroleen Hamman LPN   Activities of Daily Living In your present state of health, do you have any difficulty performing the following activities: 05/06/2020 11/08/2019  Hearing? N N  Vision? N N  Difficulty concentrating or making decisions? Y N  Comment occasionally forgets names, why he goes into a room etc.. -  Walking or climbing stairs? N N  Dressing or bathing? N N  Doing errands, shopping? N N  Preparing Food and eating ? N -  Using the Toilet? N -  In the past six months, have you accidently leaked urine? N -  Do you have problems with loss of bowel control?  N -  Managing your Medications? N -  Managing your Finances? N -  Housekeeping or managing your Housekeeping? N -  Some recent data might be hidden    Patient Care Team: Libby Maw, MD as PCP - General (Family Medicine) Constance Haw, MD as PCP - Electrophysiology (Cardiology) Mauri Pole, MD as Consulting Physician (Gastroenterology) Rosemary Holms, DPM as Consulting Physician (Podiatry) Kathrene Bongo, DMD (Dentistry)  Indicate any recent Medical Services you may have received from other than [facility] providers in the past year (date may be approximate).     Assessment:   This is a routine wellness examination for [place].  Hearing/Vision screen  Hearing Screening   125Hz  250Hz  500Hz  1000Hz  2000Hz  3000Hz  4000Hz  6000Hz  8000Hz   Right ear:           Left ear:  Comments: Bilateral hearing aids  Vision Screening Comments: Wears glasses Last eye exam 2020 [facility]  Dietary issues and exercise activities discussed: Current Exercise Habits: Home exercise routine, Type of exercise: strength training/weights, Time (Minutes): 30, Frequency (Times/Week): 3, Weekly Exercise (Minutes/Week): 90, Intensity: Mild  Goals    . DIET - EAT MORE FRUITS AND VEGETABLES    . Increase physical activity     Continue going to the gym 3x per week      Depression Screen PHQ 2/9 Scores 05/06/2020 11/11/2019 10/17/2018 10/17/2018  PHQ - 2 Score 0 0 0 0    Fall Risk Fall Risk  05/06/2020 11/11/2019 11/11/2019 11/08/2019 10/17/2018  Falls in the past year? 0 0 0 0 0  Number falls in past yr: 0 - - - -  Injury with Fall? 0 - - - -  Follow up Falls prevention discussed Falls evaluation completed Falls evaluation completed Falls evaluation completed -    Any stairs in or around the home? Yes  If so, are there any without handrails? No  Home free of loose throw rugs in walkways, pet beds, electrical cords, etc? No-1 loose rug -patient aware that it could be a trip  hazard. Adequate lighting in your home to reduce risk of falls? Yes   ASSISTIVE DEVICES UTILIZED TO PREVENT FALLS:  Life alert? No  Use of a cane, walker or w/c? No  Grab bars in the bathroom? Yes  Shower chair or bench in shower? No  Elevated toilet seat or a handicapped toilet? No   TIMED UP AND GO:  Was the test performed? No . Phone visit   Cognitive Function:     6CIT Screen 05/06/2020  What Year? 0 points  What month? 0 points  What time? 0 points  Count back from 20 0 points  Months in reverse 0 points  Repeat phrase 0 points  Total Score 0    Immunizations Immunization History  Administered Date(s) Administered  . Influenza, High Dose Seasonal PF 08/24/2018, 08/04/2019  . Moderna SARS-COVID-2 Vaccination 10/29/2019, 11/27/2019  . Pneumococcal Conjugate-13 11/10/2014  . Pneumococcal Polysaccharide-23 10/17/2018  . Tdap 01/31/2014    TDAP status: Up to date   Flu Vaccine status: Up to date   Pneumococcal vaccine status: Up to date   Covid-19 vaccine status: Completed vaccines  Qualifies for Shingles Vaccine? Yes   Zostavax completed No   Shingrix completed- Yes-patient states he had done some time last year. Unsure of date  Screening Tests Health Maintenance  Topic Date Due  . Hepatitis C Screening  Never done  . INFLUENZA VACCINE  05/10/2020  . TETANUS/TDAP  02/01/2024  . COVID-19 Vaccine  Completed  . PNA vac Low Risk Adult  Completed    Health Maintenance  Health Maintenance Due  Topic Date Due  . Hepatitis C Screening  Never done    Colorectal Cancer Screening: No longer required.  Lung Cancer Screening: (Low Dose CT Chest recommended if Age 80-80 years, 30 pack-year currently smoking OR have quit w/in 15years.) does not qualify.     Additional Screening:  Hepatitis C Screening: Discuss with PCP  Vision Screening: Recommended annual ophthalmology exams for early detection of glaucoma and other disorders of the eye. Is the patient  up to date with their annual eye exam?  Yes  Who is the provider or what is the name of the office in which the patient attends annual eye exams? [facility]   Dental Screening: Recommended annual dental  exams for proper oral hygiene  Community Resource Referral / Chronic Care Management: CRR required this visit?  No   CCM required this visit?  No      Plan:     I have personally reviewed and noted the following in the patient's chart:   . Medical and social history . Use of alcohol, tobacco or illicit drugs  . Current medications and supplements . Functional ability and status . Nutritional status . Physical activity . Advanced directives . List of other physicians . Hospitalizations, surgeries, and ER visits in previous 12 months . Vitals . Screenings to include cognitive, depression, and falls . Referrals and appointments  In addition, I have reviewed and discussed with patient certain preventive protocols, quality metrics, and best practice recommendations. A written personalized care plan for preventive services as well as general preventive health recommendations were provided to patient.  Due to this being a telephonic visit, the after visit summary with patients personalized plan was offered to patient via mail or my-chart.  Patient would like to access on my-chart.     Marta Antu, LPN   [id number]  Nurse Health Advisor  Nurse Notes: Patient states he has started B-12 injections & has had 3 in the past 3 weeks. Unable to add to med list.

## 2020-05-12 ENCOUNTER — Other Ambulatory Visit: Payer: Self-pay

## 2020-05-13 ENCOUNTER — Ambulatory Visit: Payer: Medicare HMO | Admitting: Family Medicine

## 2020-05-20 ENCOUNTER — Ambulatory Visit (INDEPENDENT_AMBULATORY_CARE_PROVIDER_SITE_OTHER): Payer: Medicare HMO | Admitting: Family Medicine

## 2020-05-20 ENCOUNTER — Encounter: Payer: Self-pay | Admitting: Family Medicine

## 2020-05-20 ENCOUNTER — Other Ambulatory Visit: Payer: Self-pay

## 2020-05-20 VITALS — BP 124/80 | HR 63 | Ht 70.0 in | Wt 206.4 lb

## 2020-05-20 DIAGNOSIS — E538 Deficiency of other specified B group vitamins: Secondary | ICD-10-CM | POA: Diagnosis not present

## 2020-05-20 DIAGNOSIS — L309 Dermatitis, unspecified: Secondary | ICD-10-CM

## 2020-05-20 LAB — VITAMIN B12: Vitamin B-12: >1526 pg/mL — ABNORMAL HIGH (ref 211–911)

## 2020-05-20 MED ORDER — TRIAMCINOLONE ACETONIDE 0.1 % EX CREA: 1.0000 "application " | TOPICAL_CREAM | Freq: Two times a day (BID) | CUTANEOUS | 1 refills | Status: DC

## 2020-05-20 NOTE — Progress Notes (Signed)
Gerald Shelton is a 79 y.o. male  Chief Complaint  Patient presents with  . Follow-up    Bumps on hands, shoulders, chest and elbows    HPI: Gerald Shelton is a 79 y.o. male who complains of "bumps" on his hands,upper back, upper chest chest, elbows, Lt lower abdomen. Symptoms started about 6 weeks ago on Rt hand. + itchy. No pain/discomfort/burning. No fever, chills. No vesicles, pustules.  No new soap, lotion, detergent, supplements, pets.  Pt has tried calamine lotion but this caused rash to "sting" He follows with derm Dr. Allyson Sabal and plans to schedule appt to discuss current symptoms.  Pt has been taking pepcid, zantac x 9 years. He stopped this about 2 wks ago due to B12 deficiency.   Pt is receiving B12 injections weekly x 6 weeks for B12 deficiency. These have been done at an office in [place] and now in [place]. He think RN is the person administering them. He would like f/u B12 level today. He thinks above symptoms started before he started the B12 injections.  Past Medical History:  Diagnosis Date  . A-fib ([place]) 10/25/2016  . Anxiety attack   . Atrial premature complexes   . Cancer ([place])    SKIN, ON FACE  . Chest pain 10/25/2016  . ED (erectile dysfunction)   . GERD (gastroesophageal reflux disease)   . Hay fever   . Hyperlipidemia   . Irregular heartbeat   . PUD (peptic ulcer disease)   . 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    Past Surgical History:  Procedure Laterality Date  . EYE SURGERY     cataract removed form right eye  . HERNIA REPAIR     x2  . NASAL SEPTUM SURGERY      Social History   Socioeconomic History  . Marital status: Married    Spouse name: Not on file  . Number of children: 6  . Years of education: Not on file  . Highest education level: Not on file  Occupational History  . Occupation: Retired  Tobacco Use  . Smoking status: Former Smoker    Quit date: 10/25/1998    Years since quitting: 21.5  . Smokeless tobacco:  Never Used  Vaping Use  . Vaping Use: Never used  Substance and Sexual Activity  . Alcohol use: Yes    Comment: RARE  . Drug use: No  . Sexual activity: Not Currently  Other Topics Concern  . Not on file  Social History Narrative  . Not on file   Social Determinants of Health   Financial Resource Strain: Low Risk   . Difficulty of Paying Living Expenses: Not hard at all  Food Insecurity: No Food Insecurity  . Worried About Charity fundraiser in the Last Year: Never true  . Ran Out of Food in the Last Year: Never true  Transportation Needs: No Transportation Needs  . Lack of Transportation (Medical): No  . Lack of Transportation (Non-Medical): No  Physical Activity: Insufficiently Active  . Days of Exercise per Week: 3 days  . Minutes of Exercise per Session: 30 min  Stress: No Stress Concern Present  . Feeling of Stress : Not at all  Social Connections: Moderately Integrated  . Frequency of Communication with Friends and Family: More than three times a week  . Frequency of Social Gatherings with Friends and Family: More than three times a week  . Attends Religious Services: Never  . Active Member of Clubs or Organizations:  Yes  . Attends Club or Organization Meetings: More than 4 times per year  . Marital Status: Married  Human resources officer Violence: Not At Risk  . Fear of Current or Ex-Partner: No  . Emotionally Abused: No  . Physically Abused: No  . Sexually Abused: No    Family History  Problem Relation Age of Onset  . Diabetes Son   . Parkinson's disease Mother   . Depression Mother   . Dementia Mother      Immunization History  Administered Date(s) Administered  . Influenza, High Dose Seasonal PF 08/24/2018, 08/04/2019  . Moderna SARS-COVID-2 Vaccination 10/29/2019, 11/27/2019  . Pneumococcal Conjugate-13 11/10/2014  . Pneumococcal Polysaccharide-23 10/17/2018  . Tdap 01/31/2014    Outpatient Encounter Medications as of 05/20/2020  Medication Sig Note   . Multiple Vitamins-Minerals (ONE-A-DAY 50 PLUS PO) Take 1 tablet by mouth daily.    . Omega-3 Fatty Acids (FISH OIL) 1200 MG CPDR Take 1 capsule by mouth daily. 05/06/2020: Taking 2 capsules daily  . vitamin B-12 (CYANOCOBALAMIN) 500 MCG tablet Take 1 tablet (500 mcg total) by mouth daily.    No facility-administered encounter medications on file as of 05/20/2020.     ROS: Pertinent positives and negatives noted in HPI. Remainder of ROS non-contributory    Allergies  Allergen Reactions  . Augmentin [Amoxicillin-Pot Clavulanate] Other (See Comments)    GI UPSET  . Simvastatin Other (See Comments)    GI UPSET    BP 124/80 (BP Location: Left Arm, Patient Position: Sitting, Cuff Size: Normal)   Pulse 63   Ht 5\' 10"  (1.778 m)   Wt 206 lb 6.4 oz (93.6 kg)   SpO2 96%   BMI 29.62 kg/m   Physical Exam Constitutional:      Appearance: Normal appearance. He is not ill-appearing or toxic-appearing.  Skin:    General: Skin is warm and dry.     Findings: Rash present. Rash is papular (erythematous papular rash in clusters on Lt hand, Lt elbow, anterior chest, upper back, Lt lower abdomen).  Neurological:     General: No focal deficit present.     Mental Status: He is alert and oriented to person, place, and time.  Psychiatric:        Mood and Affect: Mood normal.        Behavior: Behavior normal.      A/P:  1. B12 deficiency - pt has received B12 injection once weekly x 6 weeks (with ? Nurse in [place]) and is taking oral B12 supplement. He requests B12 level be checked today - Vitamin B12  2. Dermatitis - symptoms present x 6+ weeks Rx: - triamcinolone cream (KENALOG) 0.1 %; Apply 1 application topically 2 (two) times daily.  Dispense: 45 g; Refill: 1 - pt to use BID x 2 wks and if no/minimal improvement he will schedule appt with his derm at [facility]    This visit occurred during the SARS-CoV-2 public health emergency.  Safety protocols were in place,  including screening questions prior to the visit, additional usage of staff PPE, and extensive cleaning of exam room while observing appropriate contact time as indicated for disinfecting solutions.

## 2021-02-04 ENCOUNTER — Telehealth: Payer: Self-pay | Admitting: *Deleted

## 2021-02-04 NOTE — Telephone Encounter (Signed)
Called to schedule for 2nd moderna booster. Scheduled patient for 02/08/21 at 2:30 pm [place] , [place]. Patient verbalized confirmation.

## 2021-02-09 ENCOUNTER — Other Ambulatory Visit (HOSPITAL_BASED_OUTPATIENT_CLINIC_OR_DEPARTMENT_OTHER): Payer: Self-pay

## 2021-02-09 ENCOUNTER — Ambulatory Visit: Payer: Medicare HMO | Attending: Internal Medicine

## 2021-02-09 ENCOUNTER — Other Ambulatory Visit: Payer: Self-pay

## 2021-02-09 DIAGNOSIS — Z23 Encounter for immunization: Secondary | ICD-10-CM

## 2021-02-09 MED ORDER — COVID-19 MRNA VACC (MODERNA) 100 MCG/0.5ML IM SUSP
INTRAMUSCULAR | 0 refills | Status: DC
  Filled 2021-02-09: qty 0.25, 1d supply, fill #0

## 2021-02-09 NOTE — Progress Notes (Signed)
   Covid-19 Vaccination Clinic  Name:  Gerald Shelton    MRN: [id number] DOB: 05-17-1941  02/09/2021  [name] was observed post Covid-19 immunization for 15 minutes without incident. He was provided with Vaccine Information Sheet and instruction to access the V-Safe system.   [name] was instructed to call 911 with any severe reactions post vaccine: Marland Kitchen Difficulty breathing  . Swelling of face and throat  . A fast heartbeat  . A bad rash all over body  . Dizziness and weakness   Immunizations Administered    Name Date Dose VIS Date Route   Moderna Covid-19 Booster Vaccine 02/09/2021  2:35 PM 0.25 mL 07/29/2020 Intramuscular   Manufacturer: Moderna   Lot: [id number]   [place]: [id number]

## 2021-05-03 ENCOUNTER — Encounter: Payer: Self-pay | Admitting: Gastroenterology

## 2021-06-18 ENCOUNTER — Encounter: Payer: Self-pay | Admitting: Emergency Medicine

## 2021-06-18 ENCOUNTER — Other Ambulatory Visit: Payer: Self-pay

## 2021-06-18 ENCOUNTER — Ambulatory Visit
Admission: EM | Admit: 2021-06-18 | Discharge: 2021-06-18 | Disposition: A | Discharge status: Home / Self Care | Payer: Medicare HMO | Attending: Physician Assistant | Admitting: Physician Assistant

## 2021-06-18 DIAGNOSIS — J329 Chronic sinusitis, unspecified: Secondary | ICD-10-CM | POA: Diagnosis not present

## 2021-06-18 DIAGNOSIS — J189 Pneumonia, unspecified organism: Secondary | ICD-10-CM | POA: Diagnosis not present

## 2021-06-18 DIAGNOSIS — J40 Bronchitis, not specified as acute or chronic: Secondary | ICD-10-CM

## 2021-06-18 DIAGNOSIS — Z20822 Contact with and (suspected) exposure to covid-19: Secondary | ICD-10-CM | POA: Diagnosis not present

## 2021-06-18 DIAGNOSIS — R058 Other specified cough: Secondary | ICD-10-CM

## 2021-06-18 MED ORDER — CEFDINIR 300 MG PO CAPS: 300.0000 mg | ORAL_CAPSULE | Freq: Two times a day (BID) | ORAL | 0 refills | Status: DC

## 2021-06-18 MED ORDER — DOXYCYCLINE HYCLATE 100 MG PO CAPS: 100.0000 mg | ORAL_CAPSULE | Freq: Two times a day (BID) | ORAL | 0 refills | Status: DC

## 2021-06-18 MED ORDER — METHYLPREDNISOLONE ACETATE 40 MG/ML IJ SUSP
40.0000 mg | Freq: Once | INTRAMUSCULAR | Status: AC
  Administered 2021-06-18: 40 mg via INTRAMUSCULAR

## 2021-06-18 MED ORDER — ALBUTEROL SULFATE HFA 108 (90 BASE) MCG/ACT IN AERS
2.0000 | INHALATION_SPRAY | Freq: Once | RESPIRATORY_TRACT | Status: AC
  Administered 2021-06-18: 2 via RESPIRATORY_TRACT

## 2021-06-18 NOTE — ED Triage Notes (Signed)
Cough since 8/25 with low grade fever with nasal congestion and sore throat.

## 2021-06-18 NOTE — ED Provider Notes (Signed)
[facility]    CSN: [id number] Arrival date & time: 06/18/21  1429      History   Chief Complaint Chief Complaint  Patient presents with   Cough    HPI Gerald Shelton is a 80 y.o. male.   Patient presents today with a 2-week history of productive cough.  Reports symptoms initially began as URI but then initially were improving until recurrence/worsening 3 days ago.  He is now experiencing significant sinus drainage and productive cough.  He reports fever as well as headache.  He denies any chest pain, shortness of breath, dizziness, nausea, vomiting.  Reports wife was sick with similar symptoms several weeks ago but she has since recovered and has no ongoing symptoms.  He denies any history of chronic lung disease such as asthma or COPD but was a former smoker for 40 years with quit date approximately 22 years ago.  He denies any recent antibiotic use.  He has tried over-the-counter medications including NyQuil without improvement of symptoms.   Past Medical History:  Diagnosis Date   A-fib ([place]) 10/25/2016   Anxiety attack    Atrial premature complexes    Cancer ([place])    SKIN, ON FACE   Chest pain 10/25/2016   ED (erectile dysfunction)    GERD (gastroesophageal reflux disease)    Hay fever    Hyperlipidemia    Irregular heartbeat    PUD (peptic ulcer disease)    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    Patient Active Problem List   Diagnosis Date Noted   B12 deficiency 03/24/2020   Neuropathy 03/19/2020   Plantar fasciitis, bilateral 03/19/2020   Visit for well man health check 10/16/2018   APC (atrial premature contractions) 08/14/2018   Gastroesophageal reflux disease without esophagitis 08/14/2018   HLD (hyperlipidemia) 08/14/2018   GAD (generalized anxiety disorder) 08/14/2018   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    Past Surgical History:  Procedure Laterality Date   EYE SURGERY     cataract removed form right eye   HERNIA  REPAIR     x2   NASAL SEPTUM SURGERY         Home Medications    Prior to Admission medications   Medication Sig Start Date End Date Taking? Authorizing Provider  cefdinir (OMNICEF) 300 MG capsule Take 1 capsule (300 mg total) by mouth 2 (two) times daily. 06/18/21  Yes Silver Achey K, PA-C  doxycycline (VIBRAMYCIN) 100 MG capsule Take 1 capsule (100 mg total) by mouth 2 (two) times daily. 06/18/21  Yes Caya Soberanis K, PA-C  COVID-19 mRNA vaccine, Moderna, 100 MCG/0.5ML injection Inject into the muscle. 02/09/21   Carlyle Basques, MD  Multiple Vitamins-Minerals (ONE-A-DAY 50 PLUS PO) Take 1 tablet by mouth daily.  04/19/20   [provider]  Omega-3 Fatty Acids (FISH OIL) 1200 MG CPDR Take 1 capsule by mouth daily.    [provider]  triamcinolone cream (KENALOG) 0.1 % Apply 1 application topically 2 (two) times daily. 05/20/20   Cirigliano, Garvin Fila, DO  vitamin B-12 (CYANOCOBALAMIN) 500 MCG tablet Take 1 tablet (500 mcg total) by mouth daily. 03/24/20   Libby Maw, MD    Family History Family History  Problem Relation Age of Onset   Diabetes Son    Parkinson's disease Mother    Depression Mother    Dementia Mother     Social History Social History   Tobacco Use   Smoking status: Former  Types: Cigarettes    Quit date: 10/25/1998    Years since quitting: 22.6   Smokeless tobacco: Never  Vaping Use   Vaping Use: Never used  Substance Use Topics   Alcohol use: Yes    Comment: RARE   Drug use: No     Allergies   Augmentin [amoxicillin-pot clavulanate] and Simvastatin   Review of Systems Review of Systems  Review of Systems  Constitutional:  Positive for activity change, fatigue and fever. Negative for appetite change.  HENT:  Positive for congestion, postnasal drip, sinus pressure and sore throat. Negative for sneezing.   Respiratory:  Positive for cough. Negative for shortness of breath.   Cardiovascular:  Negative for chest pain.   Gastrointestinal:  Negative for abdominal pain, diarrhea, nausea and vomiting.  Musculoskeletal:  Negative for arthralgias and myalgias.  Neurological:  Positive for headaches. Negative for dizziness and light-headedness.   Physical Exam Triage Vital Signs ED Triage Vitals  Enc Vitals Group     BP 06/18/21 1609 (!) 151/81     Pulse Rate 06/18/21 1609 82     Resp 06/18/21 1609 16     Temp 06/18/21 1609 100.2 F (37.9 C)     Temp Source 06/18/21 1609 Oral     SpO2 06/18/21 1609 91 %     Weight --      Height --      Head Circumference --      Peak Flow --      Pain Score 06/18/21 1510 0     Pain Loc --      Pain Edu? --      Excl. in [place]? --    No data found.  Updated Vital Signs BP 136/75 (BP Location: Left Arm)   Pulse 82   Temp 100.2 F (37.9 C) (Oral)   Resp 16   SpO2 93%   Visual Acuity Right Eye Distance:   Left Eye Distance:   Bilateral Distance:    Right Eye Near:   Left Eye Near:    Bilateral Near:     Physical Exam  Physical Exam Vitals reviewed.  Constitutional:      General: He is awake.     Appearance: Normal appearance. He is normal weight. He is not ill-appearing.     Comments: Very pleasant male appears stated age in no acute distress sitting comfortably in exam room  HENT:     Head: Normocephalic and atraumatic.     Right Ear: External ear normal.     Left Ear: External ear normal.     Nose: Nose normal.     Mouth/Throat:     Pharynx: Uvula midline. Posterior oropharyngeal erythema present. No oropharyngeal exudate.  Cardiovascular:     Rate and Rhythm: Normal rate and regular rhythm.     Heart sounds: Normal heart sounds, S1 normal and S2 normal. No murmur heard. Pulmonary:     Effort: Pulmonary effort is normal. No accessory muscle usage or respiratory distress.     Breath sounds: No stridor. Wheezing and rales present. No rhonchi.     Comments: Scattered wheezing throughout lung fields proved with albuterol in clinic revealing Rales  in left lower lobe Abdominal:     General: Bowel sounds are normal.     Palpations: Abdomen is soft.     Tenderness: There is no abdominal tenderness.  Neurological:     Mental Status: He is alert.  Psychiatric:        Behavior: Behavior is cooperative.  UC Treatments / Results  Labs (all labs ordered are listed, but only abnormal results are displayed) Labs Reviewed  NOVEL CORONAVIRUS, NAA  CBC WITH DIFFERENTIAL/PLATELET  COMPREHENSIVE METABOLIC PANEL    EKG   Radiology No results found.  Procedures Procedures (including critical care time)  Medications Ordered in UC Medications  albuterol (VENTOLIN HFA) 108 (90 Base) MCG/ACT inhaler 2 puff (2 puffs Inhalation Given 06/18/21 1629)  methylPREDNISolone acetate (DEPO-MEDROL) injection 40 mg (40 mg Intramuscular Given 06/18/21 1630)    Initial Impression / Assessment and Plan / UC Course  I have reviewed the triage vital signs and the nursing notes.  Pertinent labs & imaging results that were available during my care of the patient were reviewed by me and considered in my medical decision making (see chart for details).      COVID test is pending per patient request.  CBC and CMP obtained today-results pending.  Discussed that if he has significant leukocytosis he will need to go to the emergency room.  Concern for pneumonia given clinical presentation.  Unfortunately, we do not have x-ray here today so we will send patient for stat x-ray outpatient.  We will empirically treat for CAP with cefdinir and doxycycline.  Based on curb 65 score he would qualify for possible inpatient management as we discussed that if he does not have improvement within 24 hours or if at any point anything worsens he must go to the emergency room.  He was given albuterol and Depo-Medrol in clinic today with improvement of oxygenation and symptoms.  Discussed at length alarm symptoms that warrant emergent evaluation.  Strict return precautions given to  which patient expressed understanding.  Final Clinical Impressions(s) / UC Diagnoses   Final diagnoses:  Encounter for screening laboratory testing for COVID-19 virus  Productive cough  Sinobronchitis  Community acquired pneumonia of left lower lobe of lung     Discharge Instructions      I am concerned that you have pneumonia.  Please go get a chest x-ray.  We will contact you with your lab work if this is abnormal.  If you have a significantly high white blood cell count you need to go to the hospital.  Based on your age you would qualify for inpatient stay for pneumonia but since you are not interested in going to the hospital at this time we will start antibiotics outpatient.  Please take cefdinir and doxycycline twice daily for 10 days.  If you do not have any improvement within 24 hours or if at any point anything worsens you must go to the ER.     ED Prescriptions     Medication Sig Dispense Auth. Provider   cefdinir (OMNICEF) 300 MG capsule Take 1 capsule (300 mg total) by mouth 2 (two) times daily. 20 capsule Akili Cuda K, PA-C   doxycycline (VIBRAMYCIN) 100 MG capsule Take 1 capsule (100 mg total) by mouth 2 (two) times daily. 20 capsule Kanyon Seibold, Derry Skill, PA-C      PDMP not reviewed this encounter.   Terrilee Croak, PA-C 06/18/21 1654

## 2021-06-18 NOTE — Discharge Instructions (Addendum)
I am concerned that you have pneumonia.  Please go get a chest x-ray.  We will contact you with your lab work if this is abnormal.  If you have a significantly high white blood cell count you need to go to the hospital.  Based on your age you would qualify for inpatient stay for pneumonia but since you are not interested in going to the hospital at this time we will start antibiotics outpatient.  Please take cefdinir and doxycycline twice daily for 10 days.  If you do not have any improvement within 24 hours or if at any point anything worsens you must go to the ER.

## 2021-06-19 ENCOUNTER — Ambulatory Visit (HOSPITAL_COMMUNITY)
Admission: RE | Admit: 2021-06-19 | Discharge: 2021-06-19 | Disposition: A | Discharge status: Home / Self Care | Payer: Medicare HMO | Source: Ambulatory Visit | Attending: Physician Assistant | Admitting: Physician Assistant

## 2021-06-19 DIAGNOSIS — R059 Cough, unspecified: Secondary | ICD-10-CM | POA: Insufficient documentation

## 2021-06-19 DIAGNOSIS — M8580 Other specified disorders of bone density and structure, unspecified site: Secondary | ICD-10-CM | POA: Diagnosis not present

## 2021-06-19 LAB — CBC WITH DIFFERENTIAL/PLATELET
Basophils Absolute: 0.1 10*3/uL (ref 0.0–0.2)
Basos: 1 %
EOS (ABSOLUTE): 0.4 10*3/uL (ref 0.0–0.4)
Eos: 4 %
Hematocrit: 45.1 % (ref 37.5–51.0)
Hemoglobin: 14.8 g/dL (ref 13.0–17.7)
Immature Grans (Abs): 0.1 10*3/uL (ref 0.0–0.1)
Immature Granulocytes: 1 %
Lymphocytes Absolute: 1.6 10*3/uL (ref 0.7–3.1)
Lymphs: 15 %
MCH: 29.5 pg (ref 26.6–33.0)
MCHC: 32.8 g/dL (ref 31.5–35.7)
MCV: 90 fL (ref 79–97)
Monocytes Absolute: 1.1 10*3/uL — ABNORMAL HIGH (ref 0.1–0.9)
Monocytes: 10 %
Neutrophils Absolute: 7.5 10*3/uL — ABNORMAL HIGH (ref 1.4–7.0)
Neutrophils: 69 %
Platelets: 273 10*3/uL (ref 150–450)
RBC: 5.01 x10E6/uL (ref 4.14–5.80)
RDW: 12.2 % (ref 11.6–15.4)
WBC: 10.7 10*3/uL (ref 3.4–10.8)

## 2021-06-19 LAB — COMPREHENSIVE METABOLIC PANEL
ALT: 14 IU/L (ref 0–44)
AST: 17 IU/L (ref 0–40)
Albumin/Globulin Ratio: 1.3 (ref 1.2–2.2)
Albumin: 3.9 g/dL (ref 3.7–4.7)
Alkaline Phosphatase: 99 IU/L (ref 44–121)
BUN/Creatinine Ratio: 19 (ref 10–24)
BUN: 15 mg/dL (ref 8–27)
Bilirubin Total: <0.2 mg/dL (ref 0.0–1.2)
CO2: 24 mmol/L (ref 20–29)
Calcium: 8.8 mg/dL (ref 8.6–10.2)
Chloride: 101 mmol/L (ref 96–106)
Creatinine, Ser: 0.81 mg/dL (ref 0.76–1.27)
Globulin, Total: 3.1 g/dL (ref 1.5–4.5)
Glucose: 100 mg/dL — ABNORMAL HIGH (ref 65–99)
Potassium: 4.5 mmol/L (ref 3.5–5.2)
Sodium: 139 mmol/L (ref 134–144)
Total Protein: 7 g/dL (ref 6.0–8.5)
eGFR: 90 mL/min/{1.73_m2} (ref >59)

## 2021-06-19 LAB — SARS-COV-2, NAA 2 DAY TAT

## 2021-06-19 LAB — NOVEL CORONAVIRUS, NAA: SARS-CoV-2, NAA: NOT DETECTED

## 2021-07-13 ENCOUNTER — Other Ambulatory Visit: Payer: Self-pay

## 2021-07-13 ENCOUNTER — Ambulatory Visit
Admission: EM | Admit: 2021-07-13 | Discharge: 2021-07-13 | Disposition: A | Discharge status: Home / Self Care | Payer: Medicare HMO

## 2021-08-09 ENCOUNTER — Ambulatory Visit: Payer: Medicare HMO | Admitting: Family Medicine

## 2021-08-09 ENCOUNTER — Other Ambulatory Visit: Payer: Self-pay

## 2021-08-09 ENCOUNTER — Ambulatory Visit (INDEPENDENT_AMBULATORY_CARE_PROVIDER_SITE_OTHER): Payer: Medicare HMO | Admitting: Nurse Practitioner

## 2021-08-09 ENCOUNTER — Telehealth: Payer: Self-pay | Admitting: Nurse Practitioner

## 2021-08-09 VITALS — BP 132/64 | HR 62 | Temp 98.7°F | Resp 14 | Ht 70.0 in | Wt 203.2 lb

## 2021-08-09 DIAGNOSIS — R519 Headache, unspecified: Secondary | ICD-10-CM | POA: Diagnosis not present

## 2021-08-09 DIAGNOSIS — J3489 Other specified disorders of nose and nasal sinuses: Secondary | ICD-10-CM | POA: Diagnosis not present

## 2021-08-09 MED ORDER — FLUTICASONE PROPIONATE 50 MCG/ACT NA SUSP: 2.0000 | Freq: Every day | NASAL | 0 refills | Status: DC

## 2021-08-09 NOTE — Patient Instructions (Signed)
Nice to see you Will try some flonase and touch base with the eye doctor if you continue to have problems

## 2021-08-09 NOTE — Telephone Encounter (Signed)
Can we triage the patient and find out what kind of acute vision change he is having and whether it is appropriate for the office.  Thanks

## 2021-08-09 NOTE — Telephone Encounter (Signed)
ERROR

## 2021-08-09 NOTE — Telephone Encounter (Signed)
PLEASE NOTE: All timestamps contained within this report are represented as [place] Standard Time. CONFIDENTIALTY NOTICE: This fax transmission is intended only for the addressee. It contains information that is legally privileged, confidential or otherwise protected from use or disclosure. If you are not the intended recipient, you are strictly prohibited from reviewing, disclosing, copying using or disseminating any of this information or taking any action in reliance on or regarding this information. If you have received this fax in error, please notify us immediately by telephone so that we can arrange for its return to [place]. Phone: [phone], Toll-Free: [phone], Fax: [phone] Page: 1 of 2 Call Id: [id number] [place] [facility] - Client TELEPHONE ADVICE RECORD [facility] Patient Name: Gerald Shelton EN Gender: Male DOB: Aug 04, 1941 Age: 80 Y 62 M 4 D Return Phone Number: [phone] (Primary) Address: City/ State/ Zip: [place] [address] Client [facility] [facility] - Client Client [facility] [place] - Day Physician Romilda Garret- NP Contact Type Call Who Is Calling Patient / Member / Family / Caregiver Call Type Triage / Clinical Caller Name Willam Munford Relationship To Patient Self Return Phone Number [phone] (Primary) Chief Complaint Headache Reason for Call Symptomatic / Request for [place] states the pt has appointment at 1040 am . The office is wanting him triaged. He was scheduled for acute vision difficulties. He is also having headaches Translation No Nurse Assessment Nurse: Thad Ranger, RN, Langley Gauss Date/Time (Eastern Time): 08/09/2021 8:59:08 AM Confirm and document reason for call. If symptomatic, describe symptoms. ---Caller states the pt has appointment at 1040 am . The office is wanting him triaged. He was scheduled for acute vision difficulties. States  primarily he is having HA's. Does the patient have any new or worsening symptoms? ---Yes Will a triage be completed? ---Yes Related visit to physician within the last 2 weeks? ---No Does the PT have any chronic conditions? (i.e. diabetes, asthma, this includes High risk factors for pregnancy, etc.) ---No Is this a behavioral health or substance abuse call? ---No Guidelines Guideline Title Affirmed Question Affirmed Notes Nurse Date/Time (Eastern Time) Headache [1] New headache AND [2] age > 78 Carmon, RN, Langley Gauss 08/09/2021 9:00:58 AM Disp. Time Eilene Ghazi Time) Disposition Final User 08/09/2021 9:03:29 AM See PCP within 24 Hours Yes Carmon, RN, Langley Gauss PLEASE NOTE: All timestamps contained within this report are represented as [place] Standard Time. CONFIDENTIALTY NOTICE: This fax transmission is intended only for the addressee. It contains information that is legally privileged, confidential or otherwise protected from use or disclosure. If you are not the intended recipient, you are strictly prohibited from reviewing, disclosing, copying using or disseminating any of this information or taking any action in reliance on or regarding this information. If you have received this fax in error, please notify us immediately by telephone so that we can arrange for its return to [place]. Phone: [phone], Toll-Free: [phone], Fax: [phone] Page: 2 of 2 Call Id: [id number] [place] Disagree/Comply Comply Caller Understands Yes PreDisposition Call Doctor Care Advice Given Per Guideline SEE PCP WITHIN 24 HOURS: PAIN MEDICINES: * IBUPROFEN (E.G., MOTRIN, ADVIL): Take 400 mg (two 200 mg pills) by mouth every 6 hours. The most you should take is 6 pills a day (1,200 mg total). * ACETAMINOPHEN - EXTRA STRENGTH TYLENOL: Take 1,000 mg (two 500 mg pills) every 6 to 8 hours as needed. Each Extra Strength Tylenol pill has 500 mg of acetaminophen. The most you should take is 6 pills  a day (3,000 mg  total). Note: In [place], the maximum is 8 pills a day (4,000 mg total). LOCAL COLD: * Apply a cold wet washcloth or cold pack to the forehead for 20 minutes. CALL BACK IF: * You become worse CARE ADVICE given per Headache (Adult) guideline. Referrals REFERRED TO PCP OFFICE

## 2021-08-09 NOTE — Progress Notes (Signed)
Acute Office Visit  Subjective:    Patient ID: Gerald Shelton, male    DOB: [birth year], 80 y.o.   MRN: [id number]  Chief Complaint  Patient presents with   Headache    And some pressure in the left eye. Mainly after been on the computer a lot or reading a lot. Sx x 2 to 3 weeks. Does have a hx of dry eyes and uses eye drops. Hx of anxiety per patient.      Patient is in today for headache  States that it has been approx 3 weeks, around the first of October.  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  States that he feels pressure behind left eye Does have an eye doctor, seen approx year ago with bilateral cataract surgery  Frontal headache that is intermittent After stopping reading or computer it will resolve with some time  Has not tried any OTC medication  Does have corrective lenses  Visual acuity WNL  Past Medical History:  Diagnosis Date   A-fib ([place]) 10/25/2016   Anxiety attack    Atrial premature complexes    Cancer ([place])    SKIN, ON FACE   Chest pain 10/25/2016   ED (erectile dysfunction)    GERD (gastroesophageal reflux disease)    Hay fever    Hyperlipidemia    Irregular heartbeat    PUD (peptic ulcer disease)    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    Past Surgical History:  Procedure Laterality Date   EYE SURGERY     cataract removed form right eye   HERNIA REPAIR     x2   NASAL SEPTUM SURGERY      Family History  Problem Relation Age of Onset   Diabetes Son    Parkinson's disease Mother    Depression Mother    Dementia Mother     Social History   Socioeconomic History   Marital status: Married    Spouse name: Not on file   Number of children: 6   Years of education: Not on file   Highest education level: Not on file  Occupational History   Occupation: Retired  Tobacco Use   Smoking status: Former    Types: Cigarettes    Quit date: 10/25/1998    Years since quitting: 22.8   Smokeless tobacco: Never   Vaping Use   Vaping Use: Never used  Substance and Sexual Activity   Alcohol use: Yes    Comment: RARE   Drug use: No   Sexual activity: Not Currently  Other Topics Concern   Not on file  Social History Narrative   Not on file   Social Determinants of Health   Financial Resource Strain: Not on file  Food Insecurity: Not on file  Transportation Needs: Not on file  Physical Activity: Not on file  Stress: Not on file  Social Connections: Not on file  Intimate Partner Violence: Not on file    Outpatient Medications Prior to Visit  Medication Sig Dispense Refill   Multiple Vitamins-Minerals (ONE-A-DAY 50 PLUS PO) Take 1 tablet by mouth daily.      cefdinir (OMNICEF) 300 MG capsule Take 1 capsule (300 mg total) by mouth 2 (two) times daily. 20 capsule 0   COVID-19 mRNA vaccine, Moderna, 100 MCG/0.5ML injection Inject into the muscle. 0.25 mL 0   doxycycline (VIBRAMYCIN) 100 MG capsule Take 1 capsule (100 mg total) by mouth 2 (two) times daily. 20 capsule 0  Omega-3 Fatty Acids (FISH OIL) 1200 MG CPDR Take 1 capsule by mouth daily.     triamcinolone cream (KENALOG) 0.1 % Apply 1 application topically 2 (two) times daily. 45 g 1   vitamin B-12 (CYANOCOBALAMIN) 500 MCG tablet Take 1 tablet (500 mcg total) by mouth daily. 90 tablet 1   No facility-administered medications prior to visit.    Allergies  Allergen Reactions   Augmentin [Amoxicillin-Pot Clavulanate] Other (See Comments)    GI UPSET   Simvastatin Other (See Comments)    GI UPSET    Review of Systems  Constitutional:  Negative for chills and fever.  HENT:  Negative for congestion, ear pain, sinus pain, sneezing and sore throat.   Eyes:  Positive for visual disturbance (flashes with night time that has been going on for years). Negative for pain and redness.  Respiratory:  Negative for cough and shortness of breath.   Cardiovascular:  Negative for chest pain.  Gastrointestinal:  Negative for constipation, diarrhea,  nausea and vomiting.  Neurological:  Positive for headaches.      Objective:    Physical Exam Vitals and nursing note reviewed.  Constitutional:      Appearance: He is well-developed.  HENT:     Head: Normocephalic.     Right Ear: Tympanic membrane, ear canal and external ear normal.     Left Ear: Tympanic membrane, ear canal and external ear normal.     Mouth/Throat:     Mouth: Mucous membranes are moist.  Eyes:     General: No visual field deficit.    Extraocular Movements: Extraocular movements intact.     Pupils: Pupils are equal, round, and reactive to light.  Cardiovascular:     Rate and Rhythm: Normal rate. Rhythm irregular.  Pulmonary:     Effort: Pulmonary effort is normal.  Abdominal:     General: Bowel sounds are normal.  Lymphadenopathy:     Cervical: No cervical adenopathy.  Skin:    General: Skin is warm.  Neurological:     Mental Status: He is alert.     Cranial Nerves: No cranial nerve deficit, dysarthria or facial asymmetry.     Sensory: No sensory deficit.     Motor: No weakness.     Coordination: Coordination normal.     Deep Tendon Reflexes: Reflexes normal.  Psychiatric:        Mood and Affect: Mood normal.        Speech: Speech normal.        Behavior: Behavior normal.        Thought Content: Thought content normal.        Judgment: Judgment normal.    BP 132/64   Pulse 62   Temp 98.7 F (37.1 C)   Resp 14   Ht '5\' 10"'  (1.778 m)   Wt 203 lb 4 oz (92.2 kg)   SpO2 96%   BMI 29.16 kg/m  Wt Readings from Last 3 Encounters:  08/09/21 203 lb 4 oz (92.2 kg)  05/20/20 206 lb 6.4 oz (93.6 kg)  05/06/20 (!) 207 lb (93.9 kg)    Health Maintenance Due  Topic Date Due   Hepatitis C Screening  Never done   Zoster Vaccines- Shingrix (1 of 2) Never done   COVID-19 Vaccine (3 - Booster for Moderna series) 04/06/2021   INFLUENZA VACCINE  05/10/2021    There are no preventive care reminders to display for this patient.   Lab Results   Component Value Date  TSH 1.59 03/23/2020   Lab Results  Component Value Date   WBC 10.7 06/18/2021   HGB 14.8 06/18/2021   HCT 45.1 06/18/2021   MCV 90 06/18/2021   PLT 273 06/18/2021   Lab Results  Component Value Date   NA 139 06/18/2021   K 4.5 06/18/2021   CO2 24 06/18/2021   GLUCOSE 100 (H) 06/18/2021   BUN 15 06/18/2021   CREATININE 0.81 06/18/2021   BILITOT <0.2 06/18/2021   ALKPHOS 99 06/18/2021   AST 17 06/18/2021   ALT 14 06/18/2021   PROT 7.0 06/18/2021   ALBUMIN 3.9 06/18/2021   CALCIUM 8.8 06/18/2021   EGFR 90 06/18/2021   GFR 74.21 11/13/2019   Lab Results  Component Value Date   CHOL 238 (H) 11/13/2019   Lab Results  Component Value Date   HDL 39.20 11/13/2019   No results found for: [facility] Lab Results  Component Value Date   TRIG 240.0 (H) 11/13/2019   Lab Results  Component Value Date   CHOLHDL 6 11/13/2019   No results found for: HGBA1C     Assessment & Plan:   Problem List Items Addressed This Visit       Other   Sinus pressure - Primary    States he feels like he has some pressure left frontal area behind his left eye we will trial a course of Flonase just to make sure there is no inflammatory process going on behind subtle.  No concern for acute angle glaucoma.  Patient is established with an eye care professional sounds very on demand related in regards to his eyesight activity and glasses.  Did recommend contacting eye care professional and following up with [place] as needed.  If asked check out okay and has not visual assistant related and he continues to have headaches consider further management up to CT scan of head given age.  Continue to monitor      Relevant Medications   fluticasone (FLONASE) 50 MCG/ACT nasal spray   Generalized headaches    Headaches seem demand related when he is doing lots of computer work or reading.  Patient does have corrective lenses and has had underwent bilateral cataract removal.  Headaches are  not bad enough to require oral analgesic.  He is neurological exam totally benign.  Continue to monitor and follow-up with PCP and eye care professional.      Relevant Medications   fluticasone (FLONASE) 50 MCG/ACT nasal spray   Other Relevant Orders   Visual acuity screening     No orders of the defined types were placed in this encounter.  This visit occurred during the SARS-CoV-2 public health emergency.  Safety protocols were in place, including screening questions prior to the visit, additional usage of staff PPE, and extensive cleaning of exam room while observing appropriate contact time as indicated for disinfecting solutions.   Romilda Garret, NP

## 2021-08-09 NOTE — Assessment & Plan Note (Signed)
States he feels like he has some pressure left frontal area behind his left eye we will trial a course of Flonase just to make sure there is no inflammatory process going on behind subtle.  No concern for acute angle glaucoma.  Patient is established with an eye care professional sounds very on demand related in regards to his eyesight activity and glasses.  Did recommend contacting eye care professional and following up with [place] as needed.  If asked check out okay and has not visual assistant related and he continues to have headaches consider further management up to CT scan of head given age.  Continue to monitor

## 2021-08-09 NOTE — Assessment & Plan Note (Signed)
Headaches seem demand related when he is doing lots of computer work or reading.  Patient does have corrective lenses and has had underwent bilateral cataract removal.  Headaches are not bad enough to require oral analgesic.  He is neurological exam totally benign.  Continue to monitor and follow-up with PCP and eye care professional.

## 2021-08-09 NOTE — Telephone Encounter (Signed)
Patient scheduled for an appointment with Romilda Garret NP today.

## 2021-08-09 NOTE — Telephone Encounter (Signed)
Patient was sent to access nurse to be triaged.

## 2021-08-16 ENCOUNTER — Other Ambulatory Visit: Payer: Self-pay

## 2021-08-16 ENCOUNTER — Ambulatory Visit (INDEPENDENT_AMBULATORY_CARE_PROVIDER_SITE_OTHER): Payer: Medicare HMO | Admitting: Cardiology

## 2021-08-16 ENCOUNTER — Encounter: Payer: Self-pay | Admitting: Cardiology

## 2021-08-16 VITALS — BP 128/70 | HR 72 | Ht 70.0 in | Wt 206.0 lb

## 2021-08-16 DIAGNOSIS — I491 Atrial premature depolarization: Secondary | ICD-10-CM

## 2021-08-16 NOTE — Progress Notes (Signed)
Electrophysiology Office Note   Date:  08/16/2021   ID:  Gerald Shelton, DOB 09-Mar-1941, MRN [id number]  PCP:  Gerald Maw, MD  Primary Electrophysiologist:  Gerald Niehaus Meredith Leeds, MD    No chief complaint on file.    History of Present Illness: Gerald Shelton is a 80 y.o. male who presents today for electrophysiology evaluation.     He has a history significant for hyperlipidemia.  Initially presented to the office after an episode of chest pain and palpitations.  Had a Myoview which was low risk with a normal ejection fraction.  48-hour monitor showed a 5% PAC burden but no other arrhythmias.  Today, denies symptoms of palpitations, chest pain, shortness of breath, orthopnea, PND, lower extremity edema, claudication, dizziness, presyncope, syncope, bleeding, or neurologic sequela. The patient is tolerating medications without difficulties.  Since being seen he has felt well.  He has had minimal palpitations.  He does state that he had some mild palpitations a few days ago, but this was the first time in the last year.  He is able do all his daily activities without restriction.   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      No current facility-administered medications for this visit.    Allergies:   Augmentin [amoxicillin-pot clavulanate]  and Simvastatin   Social History:  The patient  reports that he quit smoking about 22 years ago. His smoking use included cigarettes. He has never used smokeless tobacco. He reports current alcohol use. He reports that he does not use drugs.   Family History:  The patient's family history includes Dementia in his mother; Depression in his mother; Diabetes in his son; Parkinson's disease in his mother.   ROS:  Please see the history of present illness.   Otherwise, review of systems is positive for none.   All other systems are reviewed and negative.   PHYSICAL EXAM: VS:  BP 128/70   Pulse 72   Ht 5\' 10"  (1.778 m)   Wt 206 lb (93.4 kg)   SpO2 98%   BMI 29.56 kg/m  , BMI Body mass index is 29.56 kg/m. GEN: Well nourished, well developed, in no acute distress  HEENT: normal  Neck: no JVD, carotid bruits, or masses Cardiac: RRR; no murmurs, rubs, or gallops,no edema  Respiratory:  clear to auscultation bilaterally, normal work of breathing GI: soft, nontender, nondistended, + BS MS: no deformity or atrophy  Skin: warm and dry Neuro:  Strength and sensation are intact Psych: euthymic mood, full affect  EKG:  EKG is ordered today. Personal review of the ekg ordered shows sinus rhythm, PACs  Recent Labs: 06/18/2021: ALT 14; BUN 15; Creatinine, Ser 0.81; Hemoglobin  14.8; Platelets 273; Potassium 4.5; Sodium 139    Lipid Panel     Component Value Date/Time   CHOL 238 (H) 11/13/2019 0909   TRIG 240.0 (H) 11/13/2019 0909   HDL 39.20 11/13/2019 0909   CHOLHDL 6 11/13/2019 0909   VLDL 48.0 (H) 11/13/2019 0909   LDLDIRECT 150.0 11/13/2019 0909     Wt Readings from Last 3 Encounters:  08/16/21 206 lb (93.4 kg)  08/09/21 203 lb 4 oz (92.2 kg)  05/20/20 206 lb 6.4 oz (93.6 kg)      Other studies Reviewed: Additional studies/ records that were reviewed today include: Myoview 11/14/16 Nuclear stress EF: 57%. There was no ST segment deviation noted during stress. The study is  normal. This is a low risk study. The left ventricular ejection fraction is normal (55-65%).   Normal pharmacologic nuclear stress test with no evidence for prior infarct or ischemia.  Holter 11/09/16 Minimum HR: 40 BPM at 11:54:42 AM Maximum HR: 101 BPM at 5:09:43 PM(2) Average HR: 62 BPM Rare PVCs 5.14% APCs Symptoms of rapid heart beats associated with sinus rhythm, rates 55-85 and occasional APCs   ASSESSMENT AND PLAN:  1.  PACs: 5% on cardiac monitor.  He is currently not on any medications.  He currently feels well and has noted no major issues with palpitations.  He is able do all his daily activities.  We Gerald Shelton continue with current management.  I Gerald Shelton see him back on an as-needed basis.   Current medicines are reviewed at length with the patient today.   The patient does not have concerns regarding his medicines.  The following changes were made today: None,  Labs/ tests ordered today include:  Orders Placed This Encounter  Procedures   EKG 12-Lead      Disposition:   FU with Gerald Shelton as needed months  Signed, Gerald Shelton Meredith Leeds, MD  08/16/2021 2:50 PM     [place] [address] [phone] (office) [phone] (fax)

## 2021-08-18 LAB — HM DIABETES EYE EXAM

## 2021-08-20 ENCOUNTER — Other Ambulatory Visit: Payer: Self-pay

## 2021-08-20 ENCOUNTER — Ambulatory Visit
Admission: EM | Admit: 2021-08-20 | Discharge: 2021-08-20 | Disposition: A | Discharge status: Home / Self Care | Payer: Medicare HMO | Attending: Internal Medicine | Admitting: Internal Medicine

## 2021-08-20 ENCOUNTER — Encounter: Payer: Self-pay | Admitting: Emergency Medicine

## 2021-08-20 DIAGNOSIS — K047 Periapical abscess without sinus: Secondary | ICD-10-CM | POA: Diagnosis not present

## 2021-08-20 MED ORDER — IBUPROFEN 600 MG PO TABS: 600.0000 mg | ORAL_TABLET | Freq: Four times a day (QID) | ORAL | 0 refills | Status: DC | PRN

## 2021-08-20 MED ORDER — CLINDAMYCIN HCL 150 MG PO CAPS: 300.0000 mg | ORAL_CAPSULE | Freq: Three times a day (TID) | ORAL | 0 refills | Status: AC

## 2021-08-20 NOTE — ED Triage Notes (Signed)
Dental pain starting last night. Concerned for dental abscess. Hx of poor dentition. Only one visible tooth in area of concern, dark brown in color

## 2021-08-20 NOTE — Discharge Instructions (Addendum)
Please take medications as prescribed Keep your appointment with the dentist on Monday If symptoms worsen please return to urgent care to be reevaluated.

## 2021-08-23 NOTE — ED Provider Notes (Signed)
[facility]    CSN: [id number] Arrival date & time: 08/20/21  1221      History   Chief Complaint Chief Complaint  Patient presents with   Dental Pain    HPI Gerald Shelton is a 80 y.o. male comes to the urgent care with dental pain of 1 day duration.  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  Pain is worse in the left mandibular canine.  Tooth is brown.  No gum swelling or discharge.  No fever or chills. 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    Patient Active Problem List   Diagnosis Date Noted   Sinus pressure 08/09/2021   Generalized headaches 08/09/2021   B12 deficiency 03/24/2020   Neuropathy 03/19/2020   Plantar fasciitis, bilateral 03/19/2020   Visit for well man health check 10/16/2018   APC (atrial premature contractions) 08/14/2018   Gastroesophageal reflux disease without esophagitis 08/14/2018   HLD (hyperlipidemia) 08/14/2018   GAD (generalized anxiety disorder) 08/14/2018   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 Authorizing Provider  clindamycin (CLEOCIN) 150 MG capsule Take 2 capsules (300 mg total) by mouth 3 (three) times daily for 7 days. 08/20/21 08/27/21 Yes Keala Drum, Myrene Galas, MD  ibuprofen (ADVIL) 600 MG tablet Take 1 tablet (600 mg total) by mouth every 6 (six) hours as needed. 08/20/21  Yes  Sukaina Toothaker, Myrene Galas, MD  Multiple Vitamins-Minerals (ONE-A-DAY 50 PLUS PO) Take 1 tablet by mouth daily.  04/19/20   [provider]    Family History Family History  Problem Relation Age of Onset   Diabetes Son    Parkinson's disease Mother    Depression Mother    Dementia Mother     Social History Social History   Tobacco Use   Smoking status: Former    Types: Cigarettes    Quit date: 10/25/1998    Years since quitting: 22.8   Smokeless tobacco: Never  Vaping Use   Vaping Use: Never used  Substance Use Topics   Alcohol use: Yes    Comment: RARE   Drug use: No     Allergies   Augmentin [amoxicillin-pot clavulanate] and Simvastatin   Review of Systems Review of Systems  HENT:  Positive for dental problem.     Physical Exam Triage Vital Signs ED Triage Vitals [08/20/21 1255]  Enc Vitals Group     BP 134/73     Pulse Rate 67     Resp 16     Temp 98.7 F (37.1 C)     Temp Source Oral     SpO2 91 %     Weight      Height  Head Circumference      Peak Flow      Pain Score 4     Pain Loc      Pain Edu?      Excl. in [place]?    No data found.  Updated Vital Signs BP 134/73 (BP Location: Left Arm)   Pulse 67   Temp 98.7 F (37.1 C) (Oral)   Resp 16   SpO2 91%   Visual Acuity Right Eye Distance:   Left Eye Distance:   Bilateral Distance:    Right Eye Near:   Left Eye Near:    Bilateral Near:     Physical Exam Vitals and nursing note reviewed.  Constitutional:      General: He is not in acute distress.    Appearance: He is not ill-appearing.  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  Both canines are brown and has dental caries. Cardiovascular:     Rate and Rhythm: Normal rate and regular rhythm.     Pulses: Normal pulses.     Heart sounds: Normal heart sounds.  Pulmonary:     Effort: Pulmonary effort is normal.     Breath sounds: Normal breath sounds.  Abdominal:     General:  Bowel sounds are normal.     Palpations: Abdomen is soft.  Musculoskeletal:        General: Normal range of motion.  Neurological:     Mental Status: He is alert.     UC Treatments / Results  Labs (all labs ordered are listed, but only abnormal results are displayed) Labs Reviewed - No data to display  EKG   Radiology No results found.  Procedures Procedures (including critical care time)  Medications Ordered in UC Medications - No data to display  Initial Impression / Assessment and Plan / UC Course  I have reviewed the triage vital signs and the nursing notes.  Pertinent labs & imaging results that were available during my care of the patient were reviewed by me and considered in my medical decision making (see chart for details).     1.  Dental infection: Clindamycin 300 mg 3 times daily for 7 days Ibuprofen as needed for pain Follow-up with a dentist recommended If symptoms worsen please return to urgent care to be reevaluated. Final Clinical Impressions(s) / UC Diagnoses   Final diagnoses:  Dental infection     Discharge Instructions      Please take medications as prescribed Keep your appointment with the dentist on Monday If symptoms worsen please return to urgent care to be reevaluated.   ED Prescriptions     Medication Sig Dispense Auth. Provider   clindamycin (CLEOCIN) 150 MG capsule Take 2 capsules (300 mg total) by mouth 3 (three) times daily for 7 days. 42 capsule Jimmye Wisnieski, Myrene Galas, MD   ibuprofen (ADVIL) 600 MG tablet Take 1 tablet (600 mg total) by mouth every 6 (six) hours as needed. 30 tablet Vondell Babers, Myrene Galas, MD      PDMP not reviewed this encounter.   Chase Picket, MD 08/23/21 [phone]

## 2021-09-14 ENCOUNTER — Other Ambulatory Visit: Payer: Self-pay

## 2021-09-14 ENCOUNTER — Ambulatory Visit (INDEPENDENT_AMBULATORY_CARE_PROVIDER_SITE_OTHER): Payer: Medicare HMO | Admitting: Family Medicine

## 2021-09-14 ENCOUNTER — Encounter: Payer: Self-pay | Admitting: Family Medicine

## 2021-09-14 VITALS — BP 128/70 | HR 66 | Temp 97.4°F | Ht 70.0 in | Wt 207.2 lb

## 2021-09-14 DIAGNOSIS — Z0001 Encounter for general adult medical examination with abnormal findings: Secondary | ICD-10-CM | POA: Diagnosis not present

## 2021-09-14 DIAGNOSIS — R252 Cramp and spasm: Secondary | ICD-10-CM | POA: Diagnosis not present

## 2021-09-14 DIAGNOSIS — Z0000 Encounter for general adult medical examination without abnormal findings: Secondary | ICD-10-CM

## 2021-09-14 DIAGNOSIS — E782 Mixed hyperlipidemia: Secondary | ICD-10-CM | POA: Diagnosis not present

## 2021-09-14 NOTE — Progress Notes (Addendum)
Established Patient Office Visit  Subjective:  Patient ID: Gerald Shelton, male    DOB: 06-Nov-1940  Age: 80 y.o. MRN: [id number]  CC:  Chief Complaint  Patient presents with   Annual Exam    CPE, no concerns. Patient not fasting.     HPI Gerald Shelton presents for a health check and physical exam.  He has been doing well.  Nonfasting this afternoon.  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  They raised [place].  They keep a number of sheep, ducks and agrees to practice hurting.  They participate in competitions.  Urine flow is excellent.  He does experience nocturia at night.  He does consume fluids before bedtime to avoid muscle cramps.  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    Past Surgical History:  Procedure Laterality Date   EYE SURGERY     cataract removed form right eye   HERNIA REPAIR     x2   NASAL SEPTUM SURGERY      Family History  Problem Relation Age of Onset   Diabetes Son    Parkinson's disease Mother    Depression Mother    Dementia Mother     Social History   Socioeconomic History   Marital status: Married    Spouse name: Not on file   Number of children: 6   Years of education: Not on file   Highest education level: Not on file  Occupational History   Occupation: Retired  Tobacco Use   Smoking status: Former    Types: Cigarettes    Quit date: 10/25/1998    Years since quitting: 22.9   Smokeless tobacco: Never  Vaping Use   Vaping Use: Never used  Substance and Sexual Activity   Alcohol use: Yes    Comment: RARE   Drug use: No   Sexual activity: Not Currently  Other Topics Concern   Not on  file  Social History Narrative   Not on file   Social Determinants of Health   Financial Resource Strain: Not on file  Food Insecurity: Not on file  Transportation Needs: Not on file  Physical Activity: Not on file  Stress: Not on file  Social Connections: Not on file  Intimate Partner Violence: Not on file    Outpatient Medications Prior to Visit  Medication Sig Dispense Refill   Multiple Vitamins-Minerals (ONE-A-DAY 50 PLUS PO) Take 1 tablet by mouth daily.      ibuprofen (ADVIL) 600 MG tablet Take 1 tablet (600 mg total) by mouth every 6 (six) hours as needed. 30 tablet 0   No facility-administered medications prior to visit.    Allergies  Allergen Reactions   Augmentin [Amoxicillin-Pot Clavulanate] Other (See Comments)    GI UPSET   Simvastatin Other (See Comments)    GI UPSET    ROS Review of Systems  Constitutional:  Negative for diaphoresis, fatigue, fever and unexpected weight change.  HENT: Negative.    Eyes:  Negative for photophobia and visual disturbance.  Respiratory:  Negative for chest tightness, shortness of  breath and wheezing.   Cardiovascular:  Negative for chest pain.  Gastrointestinal:  Negative for abdominal distention.  Endocrine: Negative for polyphagia.  Genitourinary:  Negative for decreased urine volume, difficulty urinating, frequency and urgency.  Musculoskeletal:  Negative for gait problem and joint swelling.  Skin:  Negative for color change.  Neurological:  Negative for speech difficulty, weakness and light-headedness.      Objective:    Physical Exam Vitals and nursing note reviewed.  Constitutional:      General: He is not in acute distress.    Appearance: Normal appearance. He is not ill-appearing or toxic-appearing.  HENT:     Head: Normocephalic and atraumatic.     Right Ear: External ear normal.     Left Ear: External ear normal.     Mouth/Throat:     Mouth: Mucous membranes are moist.     Pharynx: Oropharynx is clear.  No oropharyngeal exudate or posterior oropharyngeal erythema.  Eyes:     Extraocular Movements: Extraocular movements intact.     Conjunctiva/sclera: Conjunctivae normal.     Pupils: Pupils are equal, round, and reactive to light.  Neck:     Vascular: No carotid bruit.  Cardiovascular:     Rate and Rhythm: Normal rate and regular rhythm.  Pulmonary:     Effort: Pulmonary effort is normal.     Breath sounds: Normal breath sounds.  Abdominal:     General: Bowel sounds are normal. There is no distension.     Palpations: There is no mass.     Tenderness: There is no abdominal tenderness. There is no guarding or rebound.     Hernia: No hernia is present.  Musculoskeletal:     Cervical back: No rigidity or tenderness.  Lymphadenopathy:     Cervical: No cervical adenopathy.  Skin:    General: Skin is warm and dry.  Neurological:     Mental Status: He is alert and oriented to person, place, and time.  Psychiatric:        Mood and Affect: Mood normal.        Behavior: Behavior normal.     BP 128/70 (BP Location: Right Arm, Patient Position: Sitting, Cuff Size: Normal)   Pulse 66   Temp (!) 97.4 F (36.3 C) (Temporal)   Ht _0  (1.778 m)   Wt 207 lb 3.2 oz (94 kg)   SpO2 95%   BMI 29.73 kg/m  Wt Readings from Last 3 Encounters:  09/14/21 207 lb 3.2 oz (94 kg)  08/16/21 206 lb (93.4 kg)  08/09/21 203 lb 4 oz (92.2 kg)     Health Maintenance Due  Topic Date Due   Hepatitis C Screening  Never done   Zoster Vaccines- Shingrix (1 of 2) Never done    There are no preventive care reminders to display for this patient.  Lab Results  Component Value Date   TSH 1.59 03/23/2020   Lab Results  Component Value Date   WBC 10.7 06/18/2021   HGB 14.8 06/18/2021   HCT 45.1 06/18/2021   MCV 90 06/18/2021   PLT 273 06/18/2021   Lab Results  Component Value Date   NA 139 06/18/2021   K 4.5 06/18/2021   CO2 24 06/18/2021   GLUCOSE 100 (H) 06/18/2021   BUN 15 06/18/2021    CREATININE 0.81 06/18/2021   BILITOT <0.2 06/18/2021   ALKPHOS 99 06/18/2021   AST 17 06/18/2021   ALT 14 06/18/2021   PROT 7.0 06/18/2021  ALBUMIN 3.9 06/18/2021   CALCIUM 8.8 06/18/2021   EGFR 90 06/18/2021   GFR 74.21 11/13/2019   Lab Results  Component Value Date   CHOL 238 (H) 11/13/2019   Lab Results  Component Value Date   HDL 39.20 11/13/2019   No results found for: [facility] Lab Results  Component Value Date   TRIG 240.0 (H) 11/13/2019   Lab Results  Component Value Date   CHOLHDL 6 11/13/2019   No results found for: HGBA1C The 10-year ASCVD risk score (Arnett DK, et al., 2019) is: 32.2%   Values used to calculate the score:     Age: 50 years     Sex: Male     Is Non-Hispanic African American: No     Diabetic: No     Tobacco smoker: No     Systolic Blood Pressure: 278 mmHg     Is BP treated: No     HDL Cholesterol: 39.2 mg/dL     Total Cholesterol: 238 mg/dL    Assessment & Plan:   Problem List Items Addressed This Visit       Other   HLD (hyperlipidemia)   Relevant Orders   Comprehensive metabolic panel   LDL cholesterol, direct   Lipid panel   Healthcare maintenance - Primary   Relevant Orders   CBC   Muscle cramps   Relevant Orders   Comprehensive metabolic panel   Urinalysis, Routine w reflex microscopic   Magnesium    No orders of the defined types were placed in this encounter.   Follow-up: Return in about 6 months (around 03/15/2022), or if symptoms worsen or fail to improve.  Will return fasting for above ordered blood work.  We discussed his elevated 10-year risk score.  He might be interested in trying a different statin other than simvastatin pending lab results.  Given information on preventing high cholesterol.  Given information on health maintenance and disease prevention as well as leg cramps.  Checking magnesium.  He is already taking a multivitamin  Libby Maw, MD

## 2021-09-16 ENCOUNTER — Other Ambulatory Visit: Payer: Self-pay

## 2021-09-16 ENCOUNTER — Other Ambulatory Visit (INDEPENDENT_AMBULATORY_CARE_PROVIDER_SITE_OTHER): Payer: Medicare HMO

## 2021-09-16 DIAGNOSIS — R252 Cramp and spasm: Secondary | ICD-10-CM

## 2021-09-16 DIAGNOSIS — E782 Mixed hyperlipidemia: Secondary | ICD-10-CM

## 2021-09-16 DIAGNOSIS — Z0000 Encounter for general adult medical examination without abnormal findings: Secondary | ICD-10-CM

## 2021-09-16 LAB — COMPREHENSIVE METABOLIC PANEL
ALT: 17 U/L (ref 0–53)
AST: 21 U/L (ref 0–37)
Albumin: 3.9 g/dL (ref 3.5–5.2)
Alkaline Phosphatase: 61 U/L (ref 39–117)
BUN: 18 mg/dL (ref 6–23)
CO2: 30 mEq/L (ref 19–32)
Calcium: 9 mg/dL (ref 8.4–10.5)
Chloride: 102 mEq/L (ref 96–112)
Creatinine, Ser: 1.01 mg/dL (ref 0.40–1.50)
GFR: 71.18 mL/min (ref >60.00)
Glucose, Bld: 95 mg/dL (ref 70–99)
Potassium: 4.3 mEq/L (ref 3.5–5.1)
Sodium: 138 mEq/L (ref 135–145)
Total Bilirubin: 0.5 mg/dL (ref 0.2–1.2)
Total Protein: 6.5 g/dL (ref 6.0–8.3)

## 2021-09-16 LAB — LIPID PANEL
Cholesterol: 233 mg/dL — ABNORMAL HIGH (ref 0–200)
HDL: 41.8 mg/dL (ref >39.00)
LDL Cholesterol: 151 mg/dL — ABNORMAL HIGH (ref 0–99)
NonHDL: 191.29
Total CHOL/HDL Ratio: 6
Triglycerides: 199 mg/dL — ABNORMAL HIGH (ref 0.0–149.0)
VLDL: 39.8 mg/dL (ref 0.0–40.0)

## 2021-09-16 LAB — CBC
HCT: 45.6 % (ref 39.0–52.0)
Hemoglobin: 14.9 g/dL (ref 13.0–17.0)
MCHC: 32.7 g/dL (ref 30.0–36.0)
MCV: 92 fl (ref 78.0–100.0)
Platelets: 197 10*3/uL (ref 150.0–400.0)
RBC: 4.95 Mil/uL (ref 4.22–5.81)
RDW: 14.1 % (ref 11.5–15.5)
WBC: 7.2 10*3/uL (ref 4.0–10.5)

## 2021-09-16 LAB — URINALYSIS, ROUTINE W REFLEX MICROSCOPIC
Bilirubin Urine: NEGATIVE
Hgb urine dipstick: NEGATIVE
Ketones, ur: NEGATIVE
Leukocytes,Ua: NEGATIVE
Nitrite: NEGATIVE
RBC / HPF: NONE SEEN (ref 0–?)
Specific Gravity, Urine: 1.015 (ref 1.000–1.030)
Total Protein, Urine: NEGATIVE
Urine Glucose: NEGATIVE
Urobilinogen, UA: 0.2 (ref 0.0–1.0)
pH: 6 (ref 5.0–8.0)

## 2021-09-16 LAB — MAGNESIUM: Magnesium: 2 mg/dL (ref 1.5–2.5)

## 2021-09-16 LAB — LDL CHOLESTEROL, DIRECT: Direct LDL: 154 mg/dL

## 2021-09-17 MED ORDER — ATORVASTATIN CALCIUM 20 MG PO TABS: 20.0000 mg | ORAL_TABLET | Freq: Every day | ORAL | 1 refills | Status: DC

## 2021-09-17 NOTE — Addendum Note (Signed)
Addended by: Jon Billings on: 09/17/2021 07:50 AM   Modules accepted: Orders

## 2022-01-05 LAB — HM DIABETES EYE EXAM

## 2022-03-03 ENCOUNTER — Other Ambulatory Visit: Payer: Self-pay | Admitting: Family Medicine

## 2022-03-03 DIAGNOSIS — E782 Mixed hyperlipidemia: Secondary | ICD-10-CM

## 2022-03-21 ENCOUNTER — Ambulatory Visit: Payer: Medicare HMO | Admitting: Family Medicine

## 2022-03-24 ENCOUNTER — Ambulatory Visit: Payer: Medicare HMO | Admitting: Family Medicine

## 2022-03-31 ENCOUNTER — Encounter: Payer: Self-pay | Admitting: Family Medicine

## 2022-03-31 ENCOUNTER — Ambulatory Visit (INDEPENDENT_AMBULATORY_CARE_PROVIDER_SITE_OTHER): Payer: Medicare HMO | Admitting: Family Medicine

## 2022-03-31 VITALS — BP 142/70 | HR 68 | Temp 97.8°F | Ht 70.0 in | Wt 209.8 lb

## 2022-03-31 DIAGNOSIS — E538 Deficiency of other specified B group vitamins: Secondary | ICD-10-CM

## 2022-03-31 DIAGNOSIS — R413 Other amnesia: Secondary | ICD-10-CM

## 2022-03-31 DIAGNOSIS — J301 Allergic rhinitis due to pollen: Secondary | ICD-10-CM | POA: Diagnosis not present

## 2022-03-31 DIAGNOSIS — H6122 Impacted cerumen, left ear: Secondary | ICD-10-CM

## 2022-03-31 DIAGNOSIS — J3489 Other specified disorders of nose and nasal sinuses: Secondary | ICD-10-CM

## 2022-03-31 DIAGNOSIS — E782 Mixed hyperlipidemia: Secondary | ICD-10-CM | POA: Diagnosis not present

## 2022-03-31 LAB — LIPID PANEL
Cholesterol: 138 mg/dL (ref 0–200)
HDL: 44.2 mg/dL (ref >39.00)
NonHDL: 94.04
Total CHOL/HDL Ratio: 3
Triglycerides: 269 mg/dL — ABNORMAL HIGH (ref 0.0–149.0)
VLDL: 53.8 mg/dL — ABNORMAL HIGH (ref 0.0–40.0)

## 2022-03-31 LAB — COMPREHENSIVE METABOLIC PANEL
ALT: 23 U/L (ref 0–53)
AST: 22 U/L (ref 0–37)
Albumin: 4.1 g/dL (ref 3.5–5.2)
Alkaline Phosphatase: 77 U/L (ref 39–117)
BUN: 18 mg/dL (ref 6–23)
CO2: 31 mEq/L (ref 19–32)
Calcium: 9.2 mg/dL (ref 8.4–10.5)
Chloride: 104 mEq/L (ref 96–112)
Creatinine, Ser: 0.92 mg/dL (ref 0.40–1.50)
GFR: 79.32 mL/min (ref >60.00)
Glucose, Bld: 147 mg/dL — ABNORMAL HIGH (ref 70–99)
Potassium: 4.4 mEq/L (ref 3.5–5.1)
Sodium: 140 mEq/L (ref 135–145)
Total Bilirubin: 0.4 mg/dL (ref 0.2–1.2)
Total Protein: 6.7 g/dL (ref 6.0–8.3)

## 2022-03-31 LAB — LDL CHOLESTEROL, DIRECT: Direct LDL: 60 mg/dL

## 2022-03-31 LAB — VITAMIN B12: Vitamin B-12: 350 pg/mL (ref 211–911)

## 2022-03-31 MED ORDER — MOMETASONE FUROATE 50 MCG/ACT NA SUSP: 2.0000 | Freq: Every day | NASAL | 12 refills | Status: DC

## 2022-03-31 NOTE — Progress Notes (Signed)
Established Patient Office Visit  Subjective   Patient ID: Gerald Shelton, male    DOB: [birth year]  Age: 81 y.o. MRN: [id number]  Chief Complaint  Patient presents with   Follow-up    6 month follow up, concerns about memory loss. Patient not fasting.     HPI follow-up of hyperlipidemia with elevated ASCVD risk score.  Tolerating atorvastatin well.  There are no myalgias.  Reports headaches described as frontal sinus pressure and cheek pressure associated with sneezing drainage.  This has been a seasonal thing for him.  Both he and his wife have been a little concerned about his memory.  He often times sets out to perform a task and becomes distracted and forgets what he had intended to do.  Does not get lost driving.  Performing all of his ADLs.  Left ear feels clogged    Review of Systems  Constitutional: Negative.   HENT: Negative.    Eyes:  Negative for blurred vision, discharge and redness.  Respiratory: Negative.    Cardiovascular: Negative.   Gastrointestinal:  Negative for abdominal pain.  Genitourinary: Negative.   Musculoskeletal: Negative.  Negative for myalgias.  Skin:  Negative for rash.  Neurological:  Negative for tingling, loss of consciousness and weakness.  Endo/Heme/Allergies:  Negative for polydipsia.      Objective:     BP (!) 142/70 (BP Location: Right Arm, Patient Position: Sitting, Cuff Size: Large)   Pulse 68   Temp 97.8 F (36.6 C) (Temporal)   Ht '5\' 10"'$  (1.778 m)   Wt 209 lb 12.8 oz (95.2 kg)   SpO2 95%   BMI 30.10 kg/m    Physical Exam Constitutional:      General: He is not in acute distress.    Appearance: Normal appearance. He is not ill-appearing, toxic-appearing or diaphoretic.  HENT:     Head: Normocephalic and atraumatic.     Right Ear: Tympanic membrane, ear canal and external ear normal.     Left Ear: External ear normal. A foreign body is present.     Ears:     Comments: Cerumen     Mouth/Throat:     Mouth: Mucous membranes  are moist.     Pharynx: Oropharynx is clear. No oropharyngeal exudate or posterior oropharyngeal erythema.  Eyes:     General: No scleral icterus.       Right eye: No discharge.        Left eye: No discharge.     Extraocular Movements: Extraocular movements intact.     Conjunctiva/sclera: Conjunctivae normal.     Pupils: Pupils are equal, round, and reactive to light.  Cardiovascular:     Rate and Rhythm: Normal rate and regular rhythm.  Pulmonary:     Effort: Pulmonary effort is normal. No respiratory distress.     Breath sounds: Normal breath sounds.  Musculoskeletal:     Cervical back: No rigidity or tenderness.  Skin:    General: Skin is warm and dry.  Neurological:     Mental Status: He is alert and oriented to person, place, and time.  Psychiatric:        Mood and Affect: Mood normal.        Behavior: Behavior normal.     Subjective:    Gerald Shelton is a 81 y.o. male whom I am asked to see for evaluation of diminished hearing in the left ear for the past 3 month. There is a prior history of cerumen impaction. The patient has  not been using ear drops to loosen wax immediately prior to this visit. The patient denies ear pain.  The patient's history has been marked as reviewed and updated as appropriate.  Review of Systems Pertinent items are noted in HPI.    Objective:    Auditory canal(s) of the left ear are completely obstructed with cerumen.   Cerumen was removed using gentle irrigation and currette.. Tympanic membranes are intact following the procedure.  Auditory canals are normal.    Assessment:    Cerumen Impaction without otitis externa.    Plan:    1. Care instructions given. 2. Home treatment: none. 3. Follow-up as needed.     No results found for any visits on 03/31/22.    The 10-year ASCVD risk score (Arnett DK, et al., 2019) is: 38.3%    Assessment & Plan:   Problem List Items Addressed This Visit       Respiratory   Seasonal allergic  rhinitis due to pollen - Primary   Relevant Medications   mometasone (NASONEX) 50 MCG/ACT nasal spray     Nervous and Auditory   Excessive cerumen in left ear canal     Other   HLD (hyperlipidemia)   Relevant Orders   Comprehensive metabolic panel   Lipid panel   B12 deficiency   Relevant Orders   Vitamin B12   Sinus pressure   Relevant Medications   mometasone (NASONEX) 50 MCG/ACT nasal spray   Memory difficulty    Return in about 3 months (around 07/01/2022).   Patient scored a 30 on the Mini-Mental status exam.  We discussed neurology referral and he will discuss this with his wife.  We will try Nasonex for allergy rhinitis and sinus pressure headaches.  Let me know how he is doing in 3 months or sooner if worse.  Advised him to use oral eardrops to soften the wax in his ears.  Information was given on ear irrigation and postnasal drip. Libby Maw, MD

## 2022-07-03 ENCOUNTER — Emergency Department (HOSPITAL_COMMUNITY): Payer: Medicare HMO

## 2022-07-03 ENCOUNTER — Encounter (HOSPITAL_COMMUNITY): Payer: Self-pay

## 2022-07-03 ENCOUNTER — Observation Stay (HOSPITAL_COMMUNITY)
Admission: EM | Admit: 2022-07-03 | Discharge: 2022-07-04 | Disposition: A | Discharge status: Home / Self Care | Payer: Medicare HMO | Attending: Family Medicine | Admitting: Family Medicine

## 2022-07-03 ENCOUNTER — Ambulatory Visit
Admission: EM | Admit: 2022-07-03 | Discharge: 2022-07-03 | Disposition: A | Discharge status: Home / Self Care | Payer: Medicare HMO

## 2022-07-03 ENCOUNTER — Other Ambulatory Visit: Payer: Self-pay

## 2022-07-03 DIAGNOSIS — I4891 Unspecified atrial fibrillation: Secondary | ICD-10-CM | POA: Diagnosis not present

## 2022-07-03 DIAGNOSIS — E782 Mixed hyperlipidemia: Secondary | ICD-10-CM | POA: Diagnosis present

## 2022-07-03 DIAGNOSIS — Z9981 Dependence on supplemental oxygen: Secondary | ICD-10-CM | POA: Insufficient documentation

## 2022-07-03 DIAGNOSIS — R0989 Other specified symptoms and signs involving the circulatory and respiratory systems: Secondary | ICD-10-CM

## 2022-07-03 DIAGNOSIS — Z87891 Personal history of nicotine dependence: Secondary | ICD-10-CM | POA: Diagnosis not present

## 2022-07-03 DIAGNOSIS — R0902 Hypoxemia: Secondary | ICD-10-CM | POA: Diagnosis not present

## 2022-07-03 DIAGNOSIS — J189 Pneumonia, unspecified organism: Secondary | ICD-10-CM

## 2022-07-03 DIAGNOSIS — Z20822 Contact with and (suspected) exposure to covid-19: Secondary | ICD-10-CM | POA: Diagnosis not present

## 2022-07-03 DIAGNOSIS — J849 Interstitial pulmonary disease, unspecified: Secondary | ICD-10-CM

## 2022-07-03 DIAGNOSIS — R0602 Shortness of breath: Secondary | ICD-10-CM | POA: Diagnosis present

## 2022-07-03 DIAGNOSIS — E785 Hyperlipidemia, unspecified: Secondary | ICD-10-CM | POA: Diagnosis present

## 2022-07-03 DIAGNOSIS — Z1152 Encounter for screening for COVID-19: Secondary | ICD-10-CM | POA: Diagnosis not present

## 2022-07-03 DIAGNOSIS — R509 Fever, unspecified: Secondary | ICD-10-CM | POA: Diagnosis not present

## 2022-07-03 DIAGNOSIS — Z1383 Encounter for screening for respiratory disorder NEC: Secondary | ICD-10-CM | POA: Insufficient documentation

## 2022-07-03 DIAGNOSIS — Z1159 Encounter for screening for other viral diseases: Secondary | ICD-10-CM | POA: Diagnosis not present

## 2022-07-03 DIAGNOSIS — J9601 Acute respiratory failure with hypoxia: Principal | ICD-10-CM | POA: Diagnosis present

## 2022-07-03 DIAGNOSIS — R7981 Abnormal blood-gas level: Secondary | ICD-10-CM

## 2022-07-03 DIAGNOSIS — Z79899 Other long term (current) drug therapy: Secondary | ICD-10-CM | POA: Diagnosis not present

## 2022-07-03 DIAGNOSIS — Z85828 Personal history of other malignant neoplasm of skin: Secondary | ICD-10-CM | POA: Insufficient documentation

## 2022-07-03 LAB — COMPREHENSIVE METABOLIC PANEL
ALT: 29 U/L (ref 0–44)
AST: 26 U/L (ref 15–41)
Albumin: 3.5 g/dL (ref 3.5–5.0)
Alkaline Phosphatase: 86 U/L (ref 38–126)
Anion gap: 9 (ref 5–15)
BUN: 12 mg/dL (ref 8–23)
CO2: 28 mmol/L (ref 22–32)
Calcium: 9 mg/dL (ref 8.9–10.3)
Chloride: 101 mmol/L (ref 98–111)
Creatinine, Ser: 1.03 mg/dL (ref 0.61–1.24)
GFR, Estimated: >60 mL/min (ref >60)
Glucose, Bld: 108 mg/dL — ABNORMAL HIGH (ref 70–99)
Potassium: 4 mmol/L (ref 3.5–5.1)
Sodium: 138 mmol/L (ref 135–145)
Total Bilirubin: 0.4 mg/dL (ref 0.3–1.2)
Total Protein: 7.1 g/dL (ref 6.5–8.1)

## 2022-07-03 LAB — CBC WITH DIFFERENTIAL/PLATELET
Abs Immature Granulocytes: 0.03 10*3/uL (ref 0.00–0.07)
Basophils Absolute: 0.1 10*3/uL (ref 0.0–0.1)
Basophils Relative: 1 %
Eosinophils Absolute: 0.8 10*3/uL — ABNORMAL HIGH (ref 0.0–0.5)
Eosinophils Relative: 9 %
HCT: 44.9 % (ref 39.0–52.0)
Hemoglobin: 14.6 g/dL (ref 13.0–17.0)
Immature Granulocytes: 0 %
Lymphocytes Relative: 13 %
Lymphs Abs: 1.1 10*3/uL (ref 0.7–4.0)
MCH: 30.2 pg (ref 26.0–34.0)
MCHC: 32.5 g/dL (ref 30.0–36.0)
MCV: 93 fL (ref 80.0–100.0)
Monocytes Absolute: 1.1 10*3/uL — ABNORMAL HIGH (ref 0.1–1.0)
Monocytes Relative: 13 %
Neutro Abs: 5.3 10*3/uL (ref 1.7–7.7)
Neutrophils Relative %: 64 %
Platelets: 242 10*3/uL (ref 150–400)
RBC: 4.83 MIL/uL (ref 4.22–5.81)
RDW: 12.1 % (ref 11.5–15.5)
WBC: 8.4 10*3/uL (ref 4.0–10.5)
nRBC: 0 % (ref 0.0–0.2)

## 2022-07-03 LAB — URINALYSIS, ROUTINE W REFLEX MICROSCOPIC
Bilirubin Urine: NEGATIVE
Glucose, UA: NEGATIVE mg/dL
Hgb urine dipstick: NEGATIVE
Ketones, ur: NEGATIVE mg/dL
Leukocytes,Ua: NEGATIVE
Nitrite: NEGATIVE
Protein, ur: NEGATIVE mg/dL
Specific Gravity, Urine: 1.019 (ref 1.005–1.030)
pH: 5 (ref 5.0–8.0)

## 2022-07-03 LAB — RESPIRATORY PANEL BY PCR

## 2022-07-03 LAB — BRAIN NATRIURETIC PEPTIDE: B Natriuretic Peptide: 29.4 pg/mL (ref 0.0–100.0)

## 2022-07-03 LAB — LACTIC ACID, PLASMA
Lactic Acid, Venous: 1.2 mmol/L (ref 0.5–1.9)
Lactic Acid, Venous: 1.1 mmol/L (ref 0.5–1.9)

## 2022-07-03 LAB — SARS CORONAVIRUS 2 BY RT PCR: SARS Coronavirus 2 by RT PCR: NEGATIVE

## 2022-07-03 MED ORDER — SODIUM CHLORIDE 0.9 % IV SOLN
500.0000 mg | INTRAVENOUS | Status: DC
  Administered 2022-07-03: 500 mg via INTRAVENOUS
  Filled 2022-07-03: qty 5

## 2022-07-03 MED ORDER — IOHEXOL 350 MG/ML SOLN
80.0000 mL | Freq: Once | INTRAVENOUS | Status: AC | PRN
  Administered 2022-07-03: 80 mL via INTRAVENOUS

## 2022-07-03 MED ORDER — ENOXAPARIN SODIUM 40 MG/0.4ML IJ SOSY
40.0000 mg | PREFILLED_SYRINGE | INTRAMUSCULAR | Status: DC
  Administered 2022-07-04: 40 mg via SUBCUTANEOUS
  Filled 2022-07-03: qty 0.4

## 2022-07-03 MED ORDER — SODIUM CHLORIDE 0.9 % IV SOLN
2.0000 g | INTRAVENOUS | Status: DC
  Administered 2022-07-03: 2 g via INTRAVENOUS
  Filled 2022-07-03: qty 20

## 2022-07-03 MED ORDER — FUROSEMIDE 10 MG/ML IJ SOLN
20.0000 mg | Freq: Once | INTRAMUSCULAR | Status: AC
  Administered 2022-07-03: 20 mg via INTRAVENOUS
  Filled 2022-07-03: qty 2

## 2022-07-03 NOTE — Assessment & Plan Note (Signed)
Presented with hx of fever up to 101F max but afebrile here. CXR concerning for possible ILD and CT chest thought bilateral opacity more related to edema. However he is asymptomatic. No peripheral edema. No leukocytosis.  -will treat empirically for pneumonia with IV Rocephin and azithromycin given history of fever -Will trial one-time dose Lasix and follow intake and output -wean as tolerated with goal O2 >92%

## 2022-07-03 NOTE — H&P (Signed)
History and Physical    Patient: Gerald Shelton WVP:[id number] DOB: 09-12-1941 DOA: 07/03/2022 DOS: the patient was seen and examined on 07/03/2022 PCP: Libby Maw, MD  Patient coming from: Home  Chief Complaint: Fever  HPI: Gerald Shelton is a 81 y.o. male with medical history significant of Seasonal allergy and GERD presents from urgent care with concerns of hypoxia.  Presented to urgent care after 2 days of fever and was found to be hypoxic down to 87% and advised to present to ED.  He reports a max fever of 101.Denies any symptoms.He was only concern because he had pneumonia last year. No cough. No chest pain. No nausea, vomiting, diarrhea. No LE edema.  Denies any shortness of breath.  He works on a farm but usually on [employer] and does not walk much due to Planter fasciitis. Actually has not noticed any fever today.  Former 81yo hx of tobacco use but quit in 2000. No alcohol or illicit drug use. States parents lived to their 69s and 61s. Unclear of his family hx. 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   In the ED, he was afebrile with temperature 99.3 F, normotensive and placed on 2 L via nasal cannula.  COVID PCR negative.  Chest x-ray shows concerns of possible interstitial lung disease. CTA chest and obtained which was negative for pulmonary embolism but had cardiomegaly and bilateral mid and lower lung groundglass opacity that could reflect edema although a chronic lung disease also possible.  He has no leukocytosis.  Lactate within normal limits.  BMP unremarkable. Review of Systems: As mentioned in the history of present illness. All other systems reviewed and are negative. 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 His smoking use included cigarettes. He has never used smokeless tobacco. He reports current alcohol use. He reports that he does not use drugs.  Allergies  Allergen Reactions   Augmentin [Amoxicillin-Pot Clavulanate] Other (See Comments)    GI UPSET   Simvastatin Other (See Comments)    GI UPSET    Family History  Problem Relation Age of Onset   Diabetes Son    Parkinson's disease Mother    Depression Mother    Dementia Mother     Prior to Admission medications   Medication Sig Start Date End Date Taking? Authorizing Provider  atorvastatin (LIPITOR) 20 MG tablet TAKE 1 TABLET BY MOUTH EVERY DAY 03/03/22   Libby Maw, MD  augmented betamethasone dipropionate (DIPROLENE-AF) 0.05 % cream Apply topically daily as needed. 03/15/22   [provider]  mometasone (NASONEX) 50 MCG/ACT nasal spray Place 2 sprays into the nose daily. 03/31/22   Libby Maw, MD  Multiple Vitamins-Minerals (ONE-A-DAY 50 PLUS PO) Take 1 tablet by mouth daily.  04/19/20  [provider]    Physical Exam: Vitals:   07/03/22 1344 07/03/22 1445 07/03/22 1630 07/03/22 1756  BP:  139/73 133/78 (!) 143/67  Pulse:  78 71 88  Resp:  '16 17 17  '$ Temp: 98.9 F (37.2 C)   98.7 F (37.1 C)  TempSrc: Oral   Oral  SpO2:  97% 94% 98%   Constitutional: NAD, calm, comfortable, not appearing elderly gentleman sitting upright in bed Eyes: lids and conjunctivae normal ENMT: Mucous membranes are moist.  Neck: normal, supple, Respiratory:right basilar crackles but no wheezing, Normal respiratory effort. No accessory muscle use. On 2L via [place] Cardiovascular: Regular rate and rhythm, no  murmurs / rubs / gallops. No extremity edema.   Abdomen: no tenderness,. Bowel sounds positive.  Musculoskeletal: no clubbing / cyanosis. No joint deformity upper and lower extremities. Good ROM, no contractures. Normal muscle tone.  Skin: no rashes, lesions, ulcers.  Neurologic: CN 2-12 grossly intact. Strength 5/5 in all 4.  Psychiatric: Normal judgment and insight. Alert and oriented x 3. Normal mood. Data Reviewed:  See HPI  Assessment and Plan: * Acute respiratory failure with hypoxia ([place]) Presented with hx of fever up to 101F max but afebrile here. CXR concerning for possible ILD and CT chest thought bilateral opacity more related to edema. However he is asymptomatic. No peripheral edema. No leukocytosis.  -will treat empirically for pneumonia with IV Rocephin and azithromycin given history of fever -Will trial one-time dose Lasix and follow intake and output -wean as tolerated with goal O2 >92%  HLD (hyperlipidemia) Continue statin      Advance Care Planning:   Code Status: Full Code   Consults: none  Family Communication: pt updated family   Severity of Illness: The appropriate patient status for this patient is OBSERVATION. Observation status is judged to be reasonable and necessary in order to provide the required intensity of service to ensure the patient's safety. The patient's presenting symptoms, physical exam findings, and initial radiographic and laboratory data in the context of their medical condition is felt to place them at decreased risk for further clinical deterioration. Furthermore, it is anticipated that the patient will be medically stable for discharge from the hospital within 2 midnights of admission.   Author: Orene Desanctis, DO 07/03/2022 7:22 PM  For on call review [email or web address].

## 2022-07-03 NOTE — ED Provider Notes (Signed)
[facility] EMERGENCY DEPARTMENT Provider Note   CSN: [id number] Arrival date & time: 07/03/22  1019     History {Add pertinent medical, surgical, social history, OB history to HPI:1} No chief complaint on file.   Gerald Shelton is a 81 y.o. male.  Patient is a 81 year old male with a history of PUD, hyperlipidemia, atrial fibrillation and prior tobacco use over 20 years ago who is presenting today with fever and hypoxia from urgent care.  Patient reports that on Friday he developed a fever of the low 100s and then had a temperature of 101 yesterday.  He denies any cough, fatigue, new shortness of breath, nausea vomiting or diarrhea.  He reports over the last few months he had noticed some mild dyspnea on exertion but denied any significant orthopnea.  He reports all summer he has been dealing with sinus congestion and has been using Flonase without any improvement.  Friday was the first time he had had fever.  He reports feeling more short of breath now than he has for the last few months.  He has not followed with his primary and was planning on seeing them next month at his regularly scheduled visit.  Unclear when his last oxygen level was checked.  He denies any abdominal pain today or urinary complaints.  He does report a history of pneumonia last year but reports then he was having significant coughing and shortness of breath.  He uses no inhalers at home.  When he went to urgent care he was found to be satting 87% on room air and was placed on oxygen and sent here for further care.  The history is provided by the patient.       Home Medications Prior to Admission medications   Medication Sig Start Date End Date Taking? Authorizing Provider  atorvastatin (LIPITOR) 20 MG tablet TAKE 1 TABLET BY MOUTH EVERY DAY 03/03/22   Libby Maw, MD  augmented betamethasone dipropionate (DIPROLENE-AF) 0.05 % cream Apply topically daily as needed. 03/15/22   [provider]  mometasone (NASONEX) 50 MCG/ACT nasal spray Place 2 sprays into the nose daily. 03/31/22   Libby Maw, MD  Multiple Vitamins-Minerals (ONE-A-DAY 50 PLUS PO) Take 1 tablet by mouth daily.  04/19/20   [provider]      Allergies    Augmentin [amoxicillin-pot clavulanate] and Simvastatin    Review of Systems   Review of Systems  Physical Exam Updated Vital Signs BP (!) 134/105 (BP Location: Right Arm)   Pulse 77   Temp 99.3 F (37.4 C) (Oral)   Resp 18   SpO2 95%  Physical Exam Vitals and nursing note reviewed.  Constitutional:      General: He is not in acute distress.    Appearance: He is well-developed.  HENT:     Head: Normocephalic and atraumatic.     Mouth/Throat:     Mouth: Mucous membranes are moist.  Eyes:     Conjunctiva/sclera: Conjunctivae normal.     Pupils: Pupils are equal, round, and reactive to light.  Cardiovascular:     Rate and Rhythm: Normal rate and regular rhythm.     Heart sounds: No murmur heard. Pulmonary:     Effort: Pulmonary effort is normal. No respiratory distress.     Breath sounds: Normal breath sounds. No wheezing or rales.     Comments: Fine crackles more prominent in the bases and middle lungs and minimally in the upper lobes Abdominal:  General: There is no distension.     Palpations: Abdomen is soft.     Tenderness: There is no abdominal tenderness. There is no guarding or rebound.  Musculoskeletal:        General: No tenderness. Normal range of motion.     Cervical back: Normal range of motion and neck supple.     Right lower leg: No edema.     Left lower leg: No edema.  Skin:    General: Skin is warm and dry.     Findings: No erythema or rash.  Neurological:     Mental Status: He is alert and oriented to person, place, and time. Mental status is at baseline.  Psychiatric:        Mood and Affect: Mood normal.        Behavior: Behavior normal.     ED Results / Procedures / Treatments    Labs (all labs ordered are listed, but only abnormal results are displayed) Labs Reviewed  COMPREHENSIVE METABOLIC PANEL - Abnormal; Notable for the following components:      Result Value   Glucose, Bld 108 (*)    All other components within normal limits  CBC WITH DIFFERENTIAL/PLATELET - Abnormal; Notable for the following components:   Monocytes Absolute 1.1 (*)    Eosinophils Absolute 0.8 (*)    All other components within normal limits  URINALYSIS, ROUTINE W REFLEX MICROSCOPIC - Abnormal; Notable for the following components:   Color, Urine AMBER (*)    APPearance HAZY (*)    All other components within normal limits  SARS CORONAVIRUS 2 BY RT PCR  LACTIC ACID, PLASMA  LACTIC ACID, PLASMA    EKG EKG Interpretation  Date/Time:  Sunday July 03 2022 10:34:12 EDT Ventricular Rate:  80 PR Interval:  146 QRS Duration: 88 QT Interval:  384 QTC Calculation: 442 R Axis:   -41 Text Interpretation: Sinus rhythm with marked sinus arrhythmia Left axis deviation Possible Inferior infarct , age undetermined Possible Anterior infarct , age undetermined No previous tracing No previous ECGs available Confirmed by Blanchie Dessert [phone]) on 07/03/2022 12:55:30 PM  Radiology DG Chest 2 View  Result Date: 07/03/2022 CLINICAL DATA:  81 year old male with history of fever and shortness of breath for the past 3 days. EXAM: CHEST - 2 VIEW COMPARISON:  Chest x-ray 06/19/2021. FINDINGS: Lung volumes are low. Widespread interstitial prominence increased compared to the prior study, most evident in the mid to lower lungs. No acute consolidative airspace disease. No pleural effusions. No pneumothorax. No evidence of pulmonary edema. Moderate-sized hiatal hernia. Heart size is normal. Upper mediastinal contours are within normal limits. IMPRESSION: 1. 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 Further evaluation with high-resolution chest CT is  recommended at this time to better characterize these findings. 2. Aortic atherosclerosis. Electronically Signed   By: Vinnie Langton M.D.   On: 07/03/2022 11:26    Procedures Procedures  {Document cardiac monitor, telemetry assessment procedure when appropriate:1}  Medications Ordered in ED Medications - No data to display  ED Course/ Medical Decision Making/ A&P                           Medical Decision Making Amount and/or Complexity of Data Reviewed Labs: ordered. Radiology: ordered.   Pt with multiple medical problems and comorbidities and presenting today with a complaint that caries a high risk for morbidity and mortality.  Here today with hypoxia  and fever over the last 2 days.  Patient has been having issues over the last few months with dyspnea on exertion which has been mild denies that changing at all.  Only the last 2 days has he had infectious symptoms.  He has not had cough, abdominal pain or urinary symptoms.  He has no evidence of cellulitis on exam.  He is well-appearing and able to speak in full sentences.  Upon arrival here patient's oxygen saturation on room air was 88% and he was 87% at urgent care.  Concern for patient's hypoxia today.  Possibility for pneumonia versus PE versus another lung pathology.  I independently interpreted patient's labs and ekg today which show a normal lactic acid, CMP, CBC and UA, COVID was negative and EKG showed no significant findings. I have independently visualized and interpreted pt's images today.  Patient's x-ray showed no obvious signs of pneumonia but radiology reports appearance suggestive of progressive interstitial lung disease and needs a high-resolution chest CT for further evaluation.  At this time low suspicion for ACS, CHF.  Findings discussed with the patient.  CT was ordered.  Patient remains on 2 L of oxygen at 95%.    {Document critical care time when appropriate:1} {Document review of labs and clinical decision tools  ie heart score, Chads2Vasc2 etc:1}  {Document your independent review of radiology images, and any outside records:1} {Document your discussion with family members, caretakers, and with consultants:1} {Document social determinants of health affecting pt's care:1} {Document your decision making why or why not admission, treatments were needed:1} Final Clinical Impression(s) / ED Diagnoses Final diagnoses:  None    Rx / DC Orders ED Discharge Orders     None

## 2022-07-03 NOTE — ED Notes (Signed)
RN turned O2 off and walked pt in room per MDs request. Pts O2 was 90% on RA. MD notified.

## 2022-07-03 NOTE — ED Provider Notes (Signed)
[facility]    CSN: [id number] Arrival date & time: 07/03/22  0803      History   Chief Complaint Chief Complaint  Patient presents with   Fever    HPI Gerald Shelton is a 81 y.o. male.   Patient presents with concerns for fever that has been intermittent for about 4 days.  Patient reports Tmax at home was 101 and is typically occurring at nighttime.  He reports that he has had a decreased appetite and fatigue but denies nasal congestion, runny nose, sore throat, ear pain, cough, nausea, vomiting, diarrhea, abdominal pain, any urinary symptoms.  Patient is concerned given that he had pneumonia about a year ago and that this may be the reason why he is having fever.  Denies any history of chronic lung diseases such as asthma or COPD.   Fever   Past Medical History:  Diagnosis Date   A-fib ([place]) 10/25/2016   Anxiety attack    Atrial premature complexes    Cancer ([place])    SKIN, ON FACE   Chest pain 10/25/2016   ED (erectile dysfunction)    GERD (gastroesophageal reflux disease)    Hay fever    Hyperlipidemia    Irregular heartbeat    PUD (peptic ulcer disease)    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    Patient Active Problem List   Diagnosis Date Noted   Memory difficulty 03/31/2022   Seasonal allergic rhinitis due to pollen 03/31/2022   Excessive cerumen in left ear canal 03/31/2022   Muscle cramps 09/14/2021   Sinus pressure 08/09/2021   Generalized headaches 08/09/2021   B12 deficiency 03/24/2020   Neuropathy 03/19/2020   Plantar fasciitis, bilateral 03/19/2020   Healthcare maintenance 10/16/2018   APC (atrial premature contractions) 08/14/2018   Gastroesophageal reflux disease without esophagitis 08/14/2018   HLD (hyperlipidemia) 08/14/2018   GAD (generalized anxiety disorder) 08/14/2018   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    Past Surgical History:  Procedure Laterality Date   EYE SURGERY     cataract removed form  right eye   HERNIA REPAIR     x2   NASAL SEPTUM SURGERY         Home Medications    Prior to Admission medications   Medication Sig Start Date End Date Taking? Authorizing Provider  atorvastatin (LIPITOR) 20 MG tablet TAKE 1 TABLET BY MOUTH EVERY DAY 03/03/22   Libby Maw, MD  augmented betamethasone dipropionate (DIPROLENE-AF) 0.05 % cream Apply topically daily as needed. 03/15/22   [provider]  mometasone (NASONEX) 50 MCG/ACT nasal spray Place 2 sprays into the nose daily. 03/31/22   Libby Maw, MD  Multiple Vitamins-Minerals (ONE-A-DAY 50 PLUS PO) Take 1 tablet by mouth daily.  04/19/20   [provider]    Family History Family History  Problem Relation Age of Onset   Diabetes Son    Parkinson's disease Mother    Depression Mother    Dementia Mother     Social History Social History   Tobacco Use   Smoking status: Former    Types: Cigarettes    Quit date: 10/25/1998    Years since quitting: 23.7   Smokeless tobacco: Never  Vaping Use   Vaping Use: Never used  Substance Use Topics   Alcohol use: Yes    Comment: RARE   Drug use: No     Allergies   Augmentin [amoxicillin-pot clavulanate] and Simvastatin   Review of Systems Review  of Systems Per HPI  Physical Exam Triage Vital Signs ED Triage Vitals [07/03/22 0814]  Enc Vitals Group     BP 136/78     Pulse Rate 90     Resp 18     Temp 98.1 F (36.7 C)     Temp src      SpO2 (!) 88 %     Weight      Height      Head Circumference      Peak Flow      Pain Score 0     Pain Loc      Pain Edu?      Excl. in [place]?    No data found.  Updated Vital Signs BP 136/78   Pulse 90   Temp 98.1 F (36.7 C)   Resp 18   SpO2 (!) 88%   Visual Acuity Right Eye Distance:   Left Eye Distance:   Bilateral Distance:    Right Eye Near:   Left Eye Near:    Bilateral Near:     Physical Exam Constitutional:      General: He is not in acute distress.     Appearance: Normal appearance. He is not toxic-appearing or diaphoretic.  HENT:     Head: Normocephalic and atraumatic.  Eyes:     Extraocular Movements: Extraocular movements intact.     Conjunctiva/sclera: Conjunctivae normal.  Cardiovascular:     Rate and Rhythm: Normal rate and regular rhythm.     Pulses: Normal pulses.     Heart sounds: Normal heart sounds.  Pulmonary:     Effort: Pulmonary effort is normal.     Comments: Crackles in bilateral lung bases. Neurological:     General: No focal deficit present.     Mental Status: He is alert and oriented to person, place, and time. Mental status is at baseline.  Psychiatric:        Mood and Affect: Mood normal.        Behavior: Behavior normal.        Thought Content: Thought content normal.        Judgment: Judgment normal.      UC Treatments / Results  Labs (all labs ordered are listed, but only abnormal results are displayed) Labs Reviewed - No data to display  EKG   Radiology No results found.  Procedures Procedures (including critical care time)  Medications Ordered in UC Medications - No data to display  Initial Impression / Assessment and Plan / UC Course  I have reviewed the triage vital signs and the nursing notes.  Pertinent labs & imaging results that were available during my care of the patient were reviewed by me and considered in my medical decision making (see chart for details).     Called to patient's room by nursing staff given the oxygen saturation was 87 to 88%.  Recheck was the same.  Patient has adventitious lung sounds of crackles in bilateral lung bases.  Patient does not appear to have tachypnea and is not in any respiratory distress.  Although, this is concerning and I do think patient needs to be further evaluated and managed at the emergency department.  Patient was agreeable with plan.  Suggested EMS transport but patient declined.  Risks associated with not going by EMS were discussed  with patient.  Patient voiced understanding and wished for his wife to transport him to the hospital.  Advised the importance of going to the hospital as soon as  possible.  Patient left via self transport. Final Clinical Impressions(s) / UC Diagnoses   Final diagnoses:  Low oxygen saturation  Abnormal lung sounds  Fever, unspecified     Discharge Instructions      Go to the emergency department as soon as you leave urgent care for further evaluation and management.    ED Prescriptions   None    PDMP not reviewed this encounter.   Teodora Medici, [place] 07/03/22 [phone]

## 2022-07-03 NOTE — ED Provider Notes (Signed)
Care taken over from Dr. Maryan Rued.  Patient presented with some shortness of breath and hypoxia.  No history of prior lung disease.  Imaging studies do not reveal any evidence of pneumonia.  No pulmonary edema.  There was some increased interstitial markings.  CT was performed.  No evidence of PE.  There are some groundglass opacities which could be edema versus interstitial lung disease.  Patient still has hypoxia.  He desatted to 86% on room air sitting on the stretcher.  I have consulted Dr. Flossie Buffy who admit the patient for further treatment.   Malvin Johns, MD 07/03/22 2010

## 2022-07-03 NOTE — ED Notes (Signed)
Pts O2 was 86% on room air sitting down. MD notified.

## 2022-07-03 NOTE — ED Notes (Signed)
Pt O2 saturations were 88% on room air,. RN placed pt on 2L, and is now 98%

## 2022-07-03 NOTE — ED Triage Notes (Signed)
Patient sent from urgent care for sats high 80s with fever x 3 days. Denies cough, denies congestion. Concerned that he may have pneumonia with same systems 1 year ago.

## 2022-07-03 NOTE — Discharge Instructions (Signed)
Go to the emergency department as soon as you leave urgent care for further evaluation and management. 

## 2022-07-03 NOTE — Assessment & Plan Note (Signed)
Continue statin. 

## 2022-07-03 NOTE — ED Triage Notes (Addendum)
Pt presents to [facility] with co of fevers and fatigue since Thursday. Pt reports no otc medications for fever. Fevers in the evening. Pt had pneumonia last year and is concerned for pneumonia

## 2022-07-04 DIAGNOSIS — J9601 Acute respiratory failure with hypoxia: Secondary | ICD-10-CM | POA: Diagnosis not present

## 2022-07-04 DIAGNOSIS — J849 Interstitial pulmonary disease, unspecified: Secondary | ICD-10-CM

## 2022-07-04 DIAGNOSIS — J189 Pneumonia, unspecified organism: Secondary | ICD-10-CM

## 2022-07-04 LAB — CBC
HCT: 40.3 % (ref 39.0–52.0)
Hemoglobin: 13.4 g/dL (ref 13.0–17.0)
MCH: 30.6 pg (ref 26.0–34.0)
MCHC: 33.3 g/dL (ref 30.0–36.0)
MCV: 92 fL (ref 80.0–100.0)
Platelets: 248 10*3/uL (ref 150–400)
RBC: 4.38 MIL/uL (ref 4.22–5.81)
RDW: 12.1 % (ref 11.5–15.5)
WBC: 8.6 10*3/uL (ref 4.0–10.5)
nRBC: 0 % (ref 0.0–0.2)

## 2022-07-04 LAB — VITAMIN B12: Vitamin B-12: 321 pg/mL (ref 180–914)

## 2022-07-04 LAB — PROCALCITONIN: Procalcitonin: <0.1 ng/mL

## 2022-07-04 MED ORDER — CEFDINIR 300 MG PO CAPS: 300.0000 mg | ORAL_CAPSULE | Freq: Two times a day (BID) | ORAL | 0 refills | Status: AC

## 2022-07-04 MED ORDER — ATORVASTATIN CALCIUM 10 MG PO TABS
20.0000 mg | ORAL_TABLET | Freq: Every day | ORAL | Status: DC
  Administered 2022-07-04: 20 mg via ORAL
  Filled 2022-07-04: qty 2

## 2022-07-04 NOTE — ED Notes (Addendum)
Pt sats on RA at rest 92% on RA. While ambulating patient's sats 84-89% on RA. Pt noted to have increase in respiratory rate after ambulating without oxygen. Pt has pneumonia as well as interstitial lung disease and would benefit from home oxygen therapy.

## 2022-07-04 NOTE — Discharge Summary (Signed)
Physician Discharge Summary  Noe Goyer VQQ:[id number] DOB: 1941/06/11 DOA: 07/03/2022  PCP: Libby Maw, MD  Admit date: 07/03/2022 Discharge date: 07/04/2022    Admitted From: Home Disposition: Home  Recommendations for Outpatient Follow-up:  Follow up with PCP in 1-2 weeks Please obtain BMP/CBC in one week Follow-up with pulmonology in 1 to 2 weeks. Please follow up with your PCP on the following pending results: Unresulted Labs (From admission, onward)    None         Home Health: None Equipment/Devices: Home oxygen  Discharge Condition: stable CODE STATUS: Full code Diet recommendation: Cardiac  Subjective: Patient seen and examined.  He states that he is feeling better.  He has not had any fever for more than 24 hours, none since he has been in the hospital and denies any shortness of breath.  Patient quit smoking 23 years ago.  He is still slightly hypoxic.  Patient prefers to go home even if he goes home with oxygen.  HPI: Cid Agena is a 81 y.o. male with medical history significant of Seasonal allergy and GERD presents from urgent care with concerns of hypoxia.   Presented to urgent care after 2 days of fever and was found to be hypoxic down to 87% and advised to present to ED.  He reports a max fever of 101.Denies any symptoms.He was only concern because he had pneumonia last year. No cough. No chest pain. No nausea, vomiting, diarrhea. No LE edema.  Denies any shortness of breath.  He works on a farm but usually on [employer] and does not walk much due to Planter fasciitis. Actually has not noticed any fever today.   Former 81yo hx of tobacco use but quit in 2000. No alcohol or illicit drug use. States parents lived to their 67s and 45s. Unclear of his family hx. 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    In the ED, he was afebrile with temperature 99.3 F, normotensive and placed on 2 L via nasal cannula.  COVID PCR negative.  Chest x-ray shows  concerns of possible interstitial lung disease. CTA chest and obtained which was negative for pulmonary embolism but had cardiomegaly and bilateral mid and lower lung groundglass opacity that could reflect edema although a chronic lung disease also possible.   He has no leukocytosis.  Lactate within normal limits.  BMP unremarkable.  Brief/Interim Summary: Patient was admitted under hospitalist service due to acute hypoxic respiratory failure which was presumed to be secondary to community-acquired pneumonia however chest x-ray as well as CT angiogram of chest did not show any infiltrates or any PE but it did show interstitial lung disease which is new for the patient.  Patient was however started on pneumonia antibiotics, Rocephin and azithromycin.  Procalcitonin also unremarkable.  Patient was ruled out of COVID, flu and rest of the respiratory viral infections.  Source of fever remains unknown.  Due to his interstitial lung disease, it would not be a bad idea to complete the course of antibiotics and thus I am going to send him home on 5 more days of cefdinir and I have strongly advised him to follow-up with PCP and establish care with pulmonology.  Also, he desaturated to 84% with ambulation on room air requiring 2 L of oxygen so he will be going home with oxygen as well.  Discharge plan was discussed with patient and/or family member and they verbalized understanding and agreed with it.  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[place])   CAP (community acquired pneumonia)    Discharge Instructions   Allergies as of 07/04/2022       Reactions   Augmentin [amoxicillin-pot Clavulanate] Other (See Comments)   GI Upset   Zocor [simvastatin] Other (See Comments)   GI Upset        Medication List     STOP taking these medications    mometasone 50 MCG/ACT nasal  spray Commonly known as: Nasonex       TAKE these medications    atorvastatin 20 MG tablet Commonly known as: LIPITOR TAKE 1 TABLET BY MOUTH EVERY DAY   augmented betamethasone dipropionate 0.05 % cream Commonly known as: DIPROLENE-AF Apply 1 Application topically daily as needed (Rash).   cefdinir 300 MG capsule Commonly known as: OMNICEF Take 1 capsule (300 mg total) by mouth 2 (two) times daily for 5 days.   fluticasone 50 MCG/ACT nasal spray Commonly known as: FLONASE Place 1 spray into both nostrils daily.   ONE-A-DAY 50 PLUS PO Take 1 tablet by mouth daily.               Durable Medical Equipment  (From admission, onward)           Start     Ordered   07/04/22 1124  For home use only DME oxygen  Once       Question Answer Comment  Length of Need 6 Months   Mode or (Route) Nasal cannula   Liters per Minute 2   Oxygen delivery system Gas      07/04/22 1123            Follow-up Information     Libby Maw, MD Follow up in 1 week(s).   Specialty: Family Medicine Contact information: [place]  [id number] [phone]         Constance Haw, MD .   Specialty: Cardiology Contact information: [address] [place] [address] [place] [address] [phone]                Allergies  Allergen Reactions   Augmentin [Amoxicillin-Pot Clavulanate] Other (See Comments)    GI Upset   Zocor [Simvastatin] Other (See Comments)    GI Upset    Consultations: None   Procedures/Studies: CT Angio Chest PE W and/or Wo Contrast  Result Date: 07/03/2022 CLINICAL DATA:  Pulmonary embolism (PE) suspected, high prob. Fever, fatigue EXAM: CT ANGIOGRAPHY CHEST WITH CONTRAST TECHNIQUE: Multidetector CT imaging of the chest was performed using the standard protocol during bolus administration of intravenous contrast. Multiplanar CT image reconstructions and MIPs were obtained to evaluate the vascular anatomy.  RADIATION DOSE REDUCTION: This exam was performed according to the departmental dose-optimization program which includes automated exposure control, adjustment of the mA and/or kV according to patient size and/or use of iterative reconstruction technique. CONTRAST:  73m OMNIPAQUE IOHEXOL 350 MG/ML SOLN COMPARISON:  None Available. FINDINGS: Cardiovascular: No filling defects in the pulmonary arteries to suggest pulmonary emboli. Cardiomegaly. Coronary artery and aortic calcifications. Mediastinum/Nodes: No mediastinal, hilar, or axillary adenopathy. Trachea and esophagus are unremarkable. Thyroid unremarkable. Lungs/Pleura: Bilateral mid and lower lung ground-glass airspace opacities. No effusions. Upper Abdomen: Gallstones within the gallbladder. Musculoskeletal: Chest wall soft tissues are unremarkable. No acute bony abnormality. Review of the MIP images confirms the above findings. IMPRESSION: No evidence of pulmonary embolus. Cardiomegaly. Bilateral mid and lower lung  ground-glass airspace opacities could reflect edema. A chronic lung disease also possible. Coronary artery disease. Cholelithiasis. Aortic Atherosclerosis (ICD10-I70.0). Electronically Signed   By: Rolm Baptise M.D.   On: 07/03/2022 17:13   DG Chest 2 View  Result Date: 07/03/2022 CLINICAL DATA:  81 year old male with history of fever and shortness of breath for the past 3 days. EXAM: CHEST - 2 VIEW COMPARISON:  Chest x-ray 06/19/2021. FINDINGS: Lung volumes are low. Widespread interstitial prominence increased compared to the prior study, most evident in the mid to lower lungs. No acute consolidative airspace disease. No pleural effusions. No pneumothorax. No evidence of pulmonary edema. Moderate-sized hiatal hernia. Heart size is normal. Upper mediastinal contours are within normal limits. IMPRESSION: 1. 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 Further evaluation with high-resolution chest CT  is recommended at this time to better characterize these findings. 2. Aortic atherosclerosis. Electronically Signed   By: Vinnie Langton M.D.   On: 07/03/2022 11:26     Discharge Exam: Vitals:   07/04/22 1100 07/04/22 1102  BP:  120/87  Pulse: 79 76  Resp: (!) 25 16  Temp:    SpO2: 92% 93%   Vitals:   07/04/22 0747 07/04/22 1015 07/04/22 1100 07/04/22 1102  BP:    120/87  Pulse:  77 79 76  Resp:  (!) 26 (!) 25 16  Temp: 98 F (36.7 C)     TempSrc: Oral     SpO2:  92% 92% 93%    General: Pt is alert, awake, not in acute distress Cardiovascular: RRR, S1/S2 +, no rubs, no gallops Respiratory: Rhonchi bilaterally Abdominal: Soft, NT, ND, bowel sounds + Extremities: no edema, no cyanosis    The results of significant diagnostics from this hospitalization (including imaging, microbiology, ancillary and laboratory) are listed below for reference.     Microbiology: Recent Results (from the past 240 hour(s))  SARS Coronavirus 2 by RT PCR (hospital order, performed in [facility] hospital lab) *cepheid single result test* Anterior Nasal Swab     Status: None   Collection Time: 07/03/22 10:43 AM   Specimen: Anterior Nasal Swab  Result Value Ref Range Status   SARS Coronavirus 2 by RT PCR NEGATIVE NEGATIVE Final    Comment: (NOTE) SARS-CoV-2 target nucleic acids are NOT DETECTED.  The SARS-CoV-2 RNA is generally detectable in upper and lower respiratory specimens during the acute phase of infection. The lowest concentration of SARS-CoV-2 viral copies this assay can detect is 250 copies / mL. A negative result does not preclude SARS-CoV-2 infection and should not be used as the sole basis for treatment or other patient management decisions.  A negative result may occur with improper specimen collection / handling, submission of specimen other than nasopharyngeal swab, presence of viral mutation(s) within the areas targeted by this assay, and inadequate number of viral  copies (<250 copies / mL). A negative result must be combined with clinical observations, patient history, and epidemiological information.  Fact Sheet for Patients:   https://www.patel.info/  Fact Sheet for Healthcare Providers: https://hall.com/  This test is not yet approved or  cleared by the [place] FDA and has been authorized for detection and/or diagnosis of SARS-CoV-2 by FDA under an Emergency Use Authorization (EUA).  This EUA will remain in effect (meaning this test can be used) for the duration of the COVID-19 declaration under Section 564(b)(1) of the Act, 21 U.S.C. section 360bbb-3(b)(1), unless the authorization is terminated or revoked sooner.  Performed  at [facility], [place] [address] [place], [place] [address]   Respiratory (~20 pathogens) panel by PCR     Status: None   Collection Time: 07/03/22  9:21 PM   Specimen: Nasopharyngeal Swab; Respiratory  Result Value Ref Range Status   Adenovirus NOT DETECTED NOT DETECTED Final   Coronavirus 229E NOT DETECTED NOT DETECTED Final    Comment: (NOTE) The Coronavirus on the Respiratory Panel, DOES NOT test for the novel  Coronavirus (2019 nCoV)    Coronavirus HKU1 NOT DETECTED NOT DETECTED Final   Coronavirus NL63 NOT DETECTED NOT DETECTED Final   Coronavirus OC43 NOT DETECTED NOT DETECTED Final   Metapneumovirus NOT DETECTED NOT DETECTED Final   Rhinovirus / Enterovirus NOT DETECTED NOT DETECTED Final   Influenza A NOT DETECTED NOT DETECTED Final   Influenza B NOT DETECTED NOT DETECTED Final   Parainfluenza Virus 1 NOT DETECTED NOT DETECTED Final   Parainfluenza Virus 2 NOT DETECTED NOT DETECTED Final   Parainfluenza Virus 3 NOT DETECTED NOT DETECTED Final   Parainfluenza Virus 4 NOT DETECTED NOT DETECTED Final   Respiratory Syncytial Virus NOT DETECTED NOT DETECTED Final   Bordetella pertussis NOT DETECTED NOT DETECTED Final   Bordetella Parapertussis NOT  DETECTED NOT DETECTED Final   Chlamydophila pneumoniae NOT DETECTED NOT DETECTED Final   Mycoplasma pneumoniae NOT DETECTED NOT DETECTED Final    Comment: Performed at [facility], [place]. [address] [place], [place] [address]     Labs: BNP (last 3 results) Recent Labs    07/03/22 1830  BNP 96.7   Basic Metabolic Panel: Recent Labs  Lab 07/03/22 1045  NA 138  K 4.0  CL 101  CO2 28  GLUCOSE 108*  BUN 12  CREATININE 1.03  CALCIUM 9.0   Liver Function Tests: Recent Labs  Lab 07/03/22 1045  AST 26  ALT 29  ALKPHOS 86  BILITOT 0.4  PROT 7.1  ALBUMIN 3.5   No results for input(s): "LIPASE", "AMYLASE" in the last 168 hours. No results for input(s): "AMMONIA" in the last 168 hours. CBC: Recent Labs  Lab 07/03/22 1045 07/04/22 0516  WBC 8.4 8.6  NEUTROABS 5.3  --   HGB 14.6 13.4  HCT 44.9 40.3  MCV 93.0 92.0  PLT 242 248   Cardiac Enzymes: No results for input(s): "CKTOTAL", "CKMB", "CKMBINDEX", "TROPONINI" in the last 168 hours. BNP: Invalid input(s): "POCBNP" CBG: No results for input(s): "GLUCAP" in the last 168 hours. D-Dimer No results for input(s): "DDIMER" in the last 72 hours. Hgb A1c No results for input(s): "HGBA1C" in the last 72 hours. Lipid Profile No results for input(s): "CHOL", "HDL", "LDLCALC", "TRIG", "CHOLHDL", "LDLDIRECT" in the last 72 hours. Thyroid function studies No results for input(s): "TSH", "T4TOTAL", "T3FREE", "THYROIDAB" in the last 72 hours.  Invalid input(s): "FREET3" Anemia work up Recent Labs    07/04/22 0516  VITAMINB12 321   Urinalysis    Component Value Date/Time   COLORURINE AMBER (A) 07/03/2022 1105   APPEARANCEUR HAZY (A) 07/03/2022 1105   LABSPEC 1.019 07/03/2022 1105   PHURINE 5.0 07/03/2022 1105   GLUCOSEU NEGATIVE 07/03/2022 [place] 09/16/2021 0923   [employer] 07/03/2022 1105   BILIRUBINUR NEGATIVE 07/03/2022 1105   KETONESUR NEGATIVE 07/03/2022 1105   PROTEINUR NEGATIVE  07/03/2022 1105   UROBILINOGEN 0.2 09/16/2021 0923   NITRITE NEGATIVE 07/03/2022 [place] 07/03/2022 1105   Sepsis Labs Recent Labs  Lab 07/03/22 1045 07/04/22 0516  WBC 8.4 8.6   Microbiology Recent Results (from the past 240 hour(s))  SARS Coronavirus 2 by RT PCR (hospital order, performed in [facility] hospital lab) *cepheid single result test* Anterior Nasal Swab     Status: None   Collection Time: 07/03/22 10:43 AM   Specimen: Anterior Nasal Swab  Result Value Ref Range Status   SARS Coronavirus 2 by RT PCR NEGATIVE NEGATIVE Final    Comment: (NOTE) SARS-CoV-2 target nucleic acids are NOT DETECTED.  The SARS-CoV-2 RNA is generally detectable in upper and lower respiratory specimens during the acute phase of infection. The lowest concentration of SARS-CoV-2 viral copies this assay can detect is 250 copies / mL. A negative result does not preclude SARS-CoV-2 infection and should not be used as the sole basis for treatment or other patient management decisions.  A negative result may occur with improper specimen collection / handling, submission of specimen other than nasopharyngeal swab, presence of viral mutation(s) within the areas targeted by this assay, and inadequate number of viral copies (<250 copies / mL). A negative result must be combined with clinical observations, patient history, and epidemiological information.  Fact Sheet for Patients:   https://www.patel.info/  Fact Sheet for Healthcare Providers: https://hall.com/  This test is not yet approved or  cleared by the [place] FDA and has been authorized for detection and/or diagnosis of SARS-CoV-2 by FDA under an Emergency Use Authorization (EUA).  This EUA will remain in effect (meaning this test can be used) for the duration of the COVID-19 declaration under Section 564(b)(1) of the Act, 21 U.S.C. section 360bbb-3(b)(1), unless the  authorization is terminated or revoked sooner.  Performed at [facility], [place] [address], [place], [place] [address]   Respiratory (~20 pathogens) panel by PCR     Status: None   Collection Time: 07/03/22  9:21 PM   Specimen: Nasopharyngeal Swab; Respiratory  Result Value Ref Range Status   Adenovirus NOT DETECTED NOT DETECTED Final   Coronavirus 229E NOT DETECTED NOT DETECTED Final    Comment: (NOTE) The Coronavirus on the Respiratory Panel, DOES NOT test for the novel  Coronavirus (2019 nCoV)    Coronavirus HKU1 NOT DETECTED NOT DETECTED Final   Coronavirus NL63 NOT DETECTED NOT DETECTED Final   Coronavirus OC43 NOT DETECTED NOT DETECTED Final   Metapneumovirus NOT DETECTED NOT DETECTED Final   Rhinovirus / Enterovirus NOT DETECTED NOT DETECTED Final   Influenza A NOT DETECTED NOT DETECTED Final   Influenza B NOT DETECTED NOT DETECTED Final   Parainfluenza Virus 1 NOT DETECTED NOT DETECTED Final   Parainfluenza Virus 2 NOT DETECTED NOT DETECTED Final   Parainfluenza Virus 3 NOT DETECTED NOT DETECTED Final   Parainfluenza Virus 4 NOT DETECTED NOT DETECTED Final   Respiratory Syncytial Virus NOT DETECTED NOT DETECTED Final   Bordetella pertussis NOT DETECTED NOT DETECTED Final   Bordetella Parapertussis NOT DETECTED NOT DETECTED Final   Chlamydophila pneumoniae NOT DETECTED NOT DETECTED Final   Mycoplasma pneumoniae NOT DETECTED NOT DETECTED Final    Comment: Performed at [facility], [place]. [address] [place], [place] [address]     Time coordinating discharge: Over 30 minutes  SIGNED:   Darliss Cheney, MD  [facility] 07/04/2022, 11:26 AM *Please note that this is a verbal dictation therefore any spelling or grammatical errors are due to the "[employer] One" system interpretation. If 7PM-7AM, please contact night-coverage [email or web address]

## 2022-07-04 NOTE — ED Notes (Addendum)
Pt ambulated in hallway, changed pulse oximetry probe, pts sats 84-89% on RA while ambulating. Denied SOB. Resting pulse oximetry 90-91% on RA.

## 2022-07-04 NOTE — Care Management (Signed)
ED RNCM received call from ED Nurse concerning home oxygen setup.  Reviewed chart and contacted [place] [facility].  Oxygen qualifying note  needed on record, discussed with ED nurse. TOC will continue to follow for transitional care plan.

## 2022-07-04 NOTE — TOC Transition Note (Signed)
Transition of Care [facility]) - CM/SW Discharge Note   Patient Details  Name: Luigi Stuckey MRN: [id number] Date of Birth: 12/21/1940  Transition of Care [facility] CM/SW Contact:  Laurena Slimmer, RN Phone Number: 07/04/2022, 4:43 PM   Clinical Narrative:     Patient is being discharged home with home oxygen.  ED RN reviewed oxygen orders and spoke with spouse who is agreeable with the recommendations for home oxygen.  Forwarded referral to [facility] with [place]   For processing.  E-tank was delivered to [facility] for transport home via private vehicle. Final next level of care: Home/Self Care     Patient Goals and CMS Choice     Choice offered to / list presented to : Patient  Discharge Placement Discharge home with wife Cristina Mattern [phone] with home oxygen                       Discharge Plan and Services    [facility] Home Oxygen                                  Social Determinants of Health (SDOH) Interventions     Readmission Risk Interventions   No data to display

## 2022-07-04 NOTE — ED Notes (Signed)
SATURATION QUALIFICATIONS: (This note is used to comply with regulatory documentation for home oxygen)  Patient Saturations on Room Air at Rest = 91%  Patient Saturations on Room Air while Ambulating = 84-89%  Patient Saturations on 2 Liters of oxygen while Ambulating = 96%  Please briefly explain why patient needs home oxygen: Pt has worsening interstitial lung disease

## 2022-07-04 NOTE — ED Notes (Signed)
Pt provided incentive spirometer and given instructions for use.

## 2022-07-05 ENCOUNTER — Encounter: Payer: Self-pay | Admitting: Family Medicine

## 2022-07-05 ENCOUNTER — Ambulatory Visit (INDEPENDENT_AMBULATORY_CARE_PROVIDER_SITE_OTHER): Payer: Medicare HMO | Admitting: Family Medicine

## 2022-07-05 VITALS — BP 124/68 | HR 92 | Temp 97.7°F | Ht 70.0 in | Wt 202.4 lb

## 2022-07-05 DIAGNOSIS — R0902 Hypoxemia: Secondary | ICD-10-CM

## 2022-07-05 DIAGNOSIS — E782 Mixed hyperlipidemia: Secondary | ICD-10-CM

## 2022-07-05 LAB — LDL CHOLESTEROL, DIRECT: Direct LDL: 67 mg/dL

## 2022-07-05 NOTE — Progress Notes (Signed)
Established Patient Office Visit  Subjective   Patient ID: Gerald Shelton, male    DOB: 08-10-1941  Age: 81 y.o. MRN: [id number]  Chief Complaint  Patient presents with   Follow-up    3 month follow up, was in hospital x 2 days for low oxygen levels. Still running low at home when active. Patient not fasting.     HPI for follow-up status post recent emergency room visit for fever and chills.  Upon arrival temperature was normal and he was found to be significantly hypoxic with PO2's in the high 80s.  At that time and since he denies any shortness of breath, dyspnea on exertion or chest pain.  There has been no nausea or vomiting or diaphoresis.  Admission was advised not happen.  BNP was 29.  Work-up did not suggest the possibility of interstitial lung disease.    Review of Systems  Constitutional: Negative.   HENT: Negative.    Eyes:  Negative for blurred vision, discharge and redness.  Respiratory: Negative.  Negative for cough, shortness of breath and wheezing.   Cardiovascular: Negative.  Negative for chest pain, palpitations and orthopnea.  Gastrointestinal:  Negative for abdominal pain.  Genitourinary: Negative.   Musculoskeletal: Negative.  Negative for myalgias.  Skin:  Negative for rash.  Neurological:  Negative for tingling, loss of consciousness and weakness.  Endo/Heme/Allergies:  Negative for polydipsia.      Objective:     BP 124/68 (BP Location: Right Arm, Patient Position: Sitting, Cuff Size: Normal)   Pulse 92   Temp 97.7 F (36.5 C) (Temporal)   Ht '5\' 10"'$  (1.778 m)   Wt 202 lb 6.4 oz (91.8 kg)   SpO2 91%   BMI 29.04 kg/m    Physical Exam Constitutional:      General: He is not in acute distress.    Appearance: Normal appearance. He is not ill-appearing, toxic-appearing or diaphoretic.  HENT:     Head: Normocephalic and atraumatic.     Right Ear: External ear normal.     Left Ear: External ear normal.     Mouth/Throat:     Mouth: Mucous membranes  are moist.     Pharynx: Oropharynx is clear. No oropharyngeal exudate or posterior oropharyngeal erythema.  Eyes:     General: No scleral icterus.       Right eye: No discharge.        Left eye: No discharge.     Extraocular Movements: Extraocular movements intact.     Conjunctiva/sclera: Conjunctivae normal.     Pupils: Pupils are equal, round, and reactive to light.  Cardiovascular:     Rate and Rhythm: Normal rate and regular rhythm.  Pulmonary:     Effort: Pulmonary effort is normal. No respiratory distress.     Breath sounds: Examination of the right-lower field reveals rhonchi. Examination of the left-lower field reveals rhonchi. Rhonchi present. No wheezing or rales.  Abdominal:     General: Bowel sounds are normal.     Tenderness: There is no abdominal tenderness. There is no guarding.  Musculoskeletal:     Cervical back: No rigidity or tenderness.     Right lower leg: No edema.     Left lower leg: No edema.  Skin:    General: Skin is warm and dry.  Neurological:     Mental Status: He is alert and oriented to person, place, and time.  Psychiatric:        Mood and Affect: Mood normal.  Behavior: Behavior normal.      No results found for any visits on 07/05/22.    The 10-year ASCVD risk score (Arnett DK, et al., 2019) is: 28.5%    Assessment & Plan:   Problem List Items Addressed This Visit       Respiratory   Hypoxia - Primary   Relevant Orders   Ambulatory referral to Pulmonology     Other   Mixed hyperlipidemia   Relevant Orders   LDL cholesterol, direct    Return in about 4 weeks (around 08/02/2022), or if symptoms worsen or fail to improve.    Libby Maw, MD

## 2022-07-06 ENCOUNTER — Telehealth: Payer: Self-pay | Admitting: Family Medicine

## 2022-07-06 DIAGNOSIS — R0902 Hypoxemia: Secondary | ICD-10-CM

## 2022-07-06 NOTE — Telephone Encounter (Signed)
Caller Name: Gerald Shelton Call back phone #: [phone]  Reason for Call: Pt called back with details. He found that this can be prescribed through [facility] phone [phone]

## 2022-07-06 NOTE — Telephone Encounter (Signed)
Spoke with patient who states that he needs a portable oxygen tank have found out that [facility] will provide this for him if an order is placed. Patient has an upcoming appointment with pulmonologist states that he needs the oxygen tank soon due to having to leave the house and not being able to carry the oxygen tank from home. Please advise,

## 2022-07-06 NOTE — Telephone Encounter (Signed)
Pt stated that he is on oxygen and want you to order a portable  concentration

## 2022-07-07 ENCOUNTER — Ambulatory Visit (INDEPENDENT_AMBULATORY_CARE_PROVIDER_SITE_OTHER): Payer: Medicare HMO | Admitting: Pulmonary Disease

## 2022-07-07 ENCOUNTER — Encounter: Payer: Self-pay | Admitting: Pulmonary Disease

## 2022-07-07 VITALS — BP 132/72 | HR 87 | Ht 70.0 in | Wt 204.6 lb

## 2022-07-07 DIAGNOSIS — J9611 Chronic respiratory failure with hypoxia: Secondary | ICD-10-CM | POA: Diagnosis not present

## 2022-07-07 NOTE — Patient Instructions (Addendum)
Nice to meet you  It is possible the abnormalities on your CT scan during recent hospitalization related to a pneumonia.  I am glad your symptoms are better.  I am hopeful with time that your oxygen need will improve.  Check your oxygen saturation at home as you are doing.  The goal is 88% or higher.  If you notice your oxygen is above 90% you can decrease the flow rate and see if your oxygen stays up.  It is likely that your oxygen will drop a little bit when you move around so please check it from time to time when you are moving around and see if we can keep the oxygen up and slowly decrease the oxygen flow rate.  I ordered a CT high-resolution of the chest to be done  Return to clinic in the first week of December 2023 after CT high-resolution of the chest.

## 2022-07-11 ENCOUNTER — Ambulatory Visit (INDEPENDENT_AMBULATORY_CARE_PROVIDER_SITE_OTHER): Payer: Medicare HMO | Admitting: Family Medicine

## 2022-07-11 ENCOUNTER — Encounter: Payer: Self-pay | Admitting: Family Medicine

## 2022-07-11 VITALS — BP 122/70 | HR 81 | Temp 98.5°F | Ht 70.0 in | Wt 202.6 lb

## 2022-07-11 DIAGNOSIS — D721 Eosinophilia, unspecified: Secondary | ICD-10-CM | POA: Diagnosis not present

## 2022-07-11 DIAGNOSIS — J8281 Chronic eosinophilic pneumonia: Secondary | ICD-10-CM

## 2022-07-11 DIAGNOSIS — R5381 Other malaise: Secondary | ICD-10-CM

## 2022-07-11 DIAGNOSIS — R0902 Hypoxemia: Secondary | ICD-10-CM

## 2022-07-11 DIAGNOSIS — R5383 Other fatigue: Secondary | ICD-10-CM | POA: Diagnosis not present

## 2022-07-11 NOTE — Progress Notes (Addendum)
Established Patient Office Visit  Subjective   Patient ID: Gerald Shelton, male    DOB: 02-28-1941  Age: 81 y.o. MRN: [id number]  Chief Complaint  Patient presents with   Acute Visit    Pt c/o loss of appetite ,feeling cold, night sweats ,fever 100.4    HPI 1 day history of fatigue with malaise.  Ran a fever yesterday.  Feels cold all of the time.  Denies respiratory tract symptoms.  He does develop hypoxia if he does not use his portable oxygen unit.  There is been no nasal congestion postnasal drip cough or wheezing.  Denies sore throat abdominal pain.  Had developed loose stools after his antibiotic therapy.  This seems to be resolving.  Denies blood or pus in his stool.  Stool is semi formed and not watery.  Some decrease in the force of his urine stream but denies dysuria frequency or urgency    Review of Systems  Constitutional:  Positive for malaise/fatigue.  HENT: Negative.    Eyes:  Negative for blurred vision, discharge and redness.  Respiratory:  Positive for shortness of breath. Negative for cough, sputum production and wheezing.   Cardiovascular: Negative.   Gastrointestinal:  Negative for abdominal pain, blood in stool, diarrhea and melena.  Genitourinary: Negative.  Negative for dysuria, frequency and urgency.  Musculoskeletal: Negative.  Negative for myalgias.  Skin:  Negative for rash.  Neurological:  Negative for tingling, loss of consciousness and weakness.  Endo/Heme/Allergies:  Negative for polydipsia.      Objective:     BP 122/70   Pulse 81   Temp 98.5 F (36.9 C) (Temporal)   Ht '5\' 10"'$  (1.778 m)   Wt 202 lb 9.6 oz (91.9 kg)   SpO2 94%   BMI 29.07 kg/m    Physical Exam Constitutional:      General: He is not in acute distress.    Appearance: Normal appearance. He is not ill-appearing, toxic-appearing or diaphoretic.  HENT:     Head: Normocephalic and atraumatic.     Right Ear: External ear normal.     Left Ear: External ear normal.      Mouth/Throat:     Mouth: Mucous membranes are moist.     Pharynx: Oropharynx is clear. No oropharyngeal exudate or posterior oropharyngeal erythema.  Eyes:     General: No scleral icterus.       Right eye: No discharge.        Left eye: No discharge.     Extraocular Movements: Extraocular movements intact.     Conjunctiva/sclera: Conjunctivae normal.     Pupils: Pupils are equal, round, and reactive to light.  Cardiovascular:     Rate and Rhythm: Normal rate and regular rhythm.  Pulmonary:     Effort: Pulmonary effort is normal. No respiratory distress.     Breath sounds: Normal breath sounds. Decreased air movement present.  Abdominal:     General: Bowel sounds are normal.     Tenderness: There is no abdominal tenderness. There is no guarding.  Musculoskeletal:     Cervical back: No rigidity or tenderness.  Skin:    General: Skin is warm and dry.  Neurological:     Mental Status: He is alert and oriented to person, place, and time.  Psychiatric:        Mood and Affect: Mood normal.        Behavior: Behavior normal.      Results for orders placed or performed in visit on  07/11/22  CBC w/Diff  Result Value Ref Range   WBC 7.6 4.0 - 10.5 K/uL   RBC 4.56 4.22 - 5.81 Mil/uL   Hemoglobin 13.8 13.0 - 17.0 g/dL   HCT 41.5 39.0 - 52.0 %   MCV 91.0 78.0 - 100.0 fl   MCHC 33.1 30.0 - 36.0 g/dL   RDW 12.9 11.5 - 15.5 %   Platelets 319.0 150.0 - 400.0 K/uL   Neutrophils Relative % 65.8 43.0 - 77.0 %   Lymphocytes Relative 12.2 12.0 - 46.0 %   Monocytes Relative 8.1 3.0 - 12.0 %   Eosinophils Relative 12.9 (H) 0.0 - 5.0 %   Basophils Relative 1.0 0.0 - 3.0 %   Neutro Abs 5.0 1.4 - 7.7 K/uL   Lymphs Abs 0.9 0.7 - 4.0 K/uL   Monocytes Absolute 0.6 0.1 - 1.0 K/uL   Eosinophils Absolute 1.0 (H) 0.0 - 0.7 K/uL   Basophils Absolute 0.1 0.0 - 0.1 K/uL  Comprehensive metabolic panel  Result Value Ref Range   Sodium 138 135 - 145 mEq/L   Potassium 4.0 3.5 - 5.1 mEq/L   Chloride 100  96 - 112 mEq/L   CO2 27 19 - 32 mEq/L   Glucose, Bld 138 (H) 70 - 99 mg/dL   BUN 12 6 - 23 mg/dL   Creatinine, Ser 1.03 0.40 - 1.50 mg/dL   Total Bilirubin 0.4 0.2 - 1.2 mg/dL   Alkaline Phosphatase 87 39 - 117 U/L   AST 22 0 - 37 U/L   ALT 23 0 - 53 U/L   Total Protein 6.9 6.0 - 8.3 g/dL   Albumin 3.6 3.5 - 5.2 g/dL   GFR 69.13 >60.00 mL/min   Calcium 9.1 8.4 - 10.5 mg/dL  Urinalysis, Routine w reflex microscopic  Result Value Ref Range   Color, Urine YELLOW Yellow;Lt. Yellow;Straw;Dark Yellow;Amber;Green;Red;Brown   APPearance CLEAR Clear;Turbid;Slightly Cloudy;Cloudy   Specific Gravity, Urine 1.010 1.000 - 1.030   pH 6.5 5.0 - 8.0   Total Protein, Urine NEGATIVE Negative   Urine Glucose NEGATIVE Negative   Ketones, ur NEGATIVE Negative   Bilirubin Urine NEGATIVE Negative   Hgb urine dipstick NEGATIVE Negative   Urobilinogen, UA 0.2 0.0 - 1.0   Leukocytes,Ua NEGATIVE Negative   Nitrite NEGATIVE Negative   WBC, UA none seen 0-2/hpf   RBC / HPF none seen 0-2/hpf   Mucus, UA Presence of (A) None      The 10-year ASCVD risk score (Arnett DK, et al., 2019) is: 27.8%    Assessment & Plan:   Problem List Items Addressed This Visit       Respiratory   Hypoxia   Relevant Orders   Ambulatory referral to [place] and fatigue - Primary   Relevant Orders   CBC w/Diff (Completed)   Comprehensive metabolic panel (Completed)   Urinalysis, Routine w reflex microscopic (Completed)   Other Visit Diagnoses     Eosinophilia, unspecified type       Relevant Orders   IgE (Completed)   Sedimentation rate (Completed)   C-reactive protein (Completed)   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  Continue O2 therapy.  Checking above blood work.  To emergency room if worsening.  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  Referral to pulmonology is  pending.  Libby Maw, MD

## 2022-07-12 ENCOUNTER — Other Ambulatory Visit: Payer: Medicare HMO

## 2022-07-12 LAB — CBC WITH DIFFERENTIAL/PLATELET
Basophils Absolute: 0.1 10*3/uL (ref 0.0–0.1)
Basophils Relative: 1 % (ref 0.0–3.0)
Eosinophils Absolute: 1 10*3/uL — ABNORMAL HIGH (ref 0.0–0.7)
Eosinophils Relative: 12.9 % — ABNORMAL HIGH (ref 0.0–5.0)
HCT: 41.5 % (ref 39.0–52.0)
Hemoglobin: 13.8 g/dL (ref 13.0–17.0)
Lymphocytes Relative: 12.2 % (ref 12.0–46.0)
Lymphs Abs: 0.9 10*3/uL (ref 0.7–4.0)
MCHC: 33.1 g/dL (ref 30.0–36.0)
MCV: 91 fl (ref 78.0–100.0)
Monocytes Absolute: 0.6 10*3/uL (ref 0.1–1.0)
Monocytes Relative: 8.1 % (ref 3.0–12.0)
Neutro Abs: 5 10*3/uL (ref 1.4–7.7)
Neutrophils Relative %: 65.8 % (ref 43.0–77.0)
Platelets: 319 10*3/uL (ref 150.0–400.0)
RBC: 4.56 Mil/uL (ref 4.22–5.81)
RDW: 12.9 % (ref 11.5–15.5)
WBC: 7.6 10*3/uL (ref 4.0–10.5)

## 2022-07-12 LAB — COMPREHENSIVE METABOLIC PANEL
ALT: 23 U/L (ref 0–53)
AST: 22 U/L (ref 0–37)
Albumin: 3.6 g/dL (ref 3.5–5.2)
Alkaline Phosphatase: 87 U/L (ref 39–117)
BUN: 12 mg/dL (ref 6–23)
CO2: 27 mEq/L (ref 19–32)
Calcium: 9.1 mg/dL (ref 8.4–10.5)
Chloride: 100 mEq/L (ref 96–112)
Creatinine, Ser: 1.03 mg/dL (ref 0.40–1.50)
GFR: 69.13 mL/min (ref >60.00)
Glucose, Bld: 138 mg/dL — ABNORMAL HIGH (ref 70–99)
Potassium: 4 mEq/L (ref 3.5–5.1)
Sodium: 138 mEq/L (ref 135–145)
Total Bilirubin: 0.4 mg/dL (ref 0.2–1.2)
Total Protein: 6.9 g/dL (ref 6.0–8.3)

## 2022-07-12 LAB — URINALYSIS, ROUTINE W REFLEX MICROSCOPIC
Bilirubin Urine: NEGATIVE
Hgb urine dipstick: NEGATIVE
Ketones, ur: NEGATIVE
Leukocytes,Ua: NEGATIVE
Nitrite: NEGATIVE
RBC / HPF: NONE SEEN (ref 0–?)
Specific Gravity, Urine: 1.01 (ref 1.000–1.030)
Total Protein, Urine: NEGATIVE
Urine Glucose: NEGATIVE
Urobilinogen, UA: 0.2 (ref 0.0–1.0)
WBC, UA: NONE SEEN (ref 0–?)
pH: 6.5 (ref 5.0–8.0)

## 2022-07-13 NOTE — Addendum Note (Signed)
Addended by: Jon Billings on: 07/13/2022 08:04 AM   Modules accepted: Orders

## 2022-07-14 ENCOUNTER — Other Ambulatory Visit (INDEPENDENT_AMBULATORY_CARE_PROVIDER_SITE_OTHER): Payer: Medicare HMO

## 2022-07-14 DIAGNOSIS — D721 Eosinophilia, unspecified: Secondary | ICD-10-CM | POA: Diagnosis not present

## 2022-07-14 LAB — C-REACTIVE PROTEIN: CRP: 2.8 mg/dL (ref 0.5–20.0)

## 2022-07-14 LAB — SEDIMENTATION RATE: Sed Rate: 33 mm/hr — ABNORMAL HIGH (ref 0–20)

## 2022-07-15 LAB — IGE: IgE (Immunoglobulin E), Serum: 666 kU/L — ABNORMAL HIGH (ref <=114)

## 2022-07-17 NOTE — Progress Notes (Signed)
$'@Patient'U$  ID: Gerald Shelton, male    DOB: 1941/08/20, 81 y.o.   MRN: [id number]  Chief Complaint  Patient presents with   Consult    Referring provider: Libby Maw  HPI:   81 y.o. man whom are seen in consultation for evaluation of pneumonia, abnormal CT, as well as hospital follow-up.  Most recent PCP note reviewed.  Discharge summary 07/04/2022 reviewed.  Patient was in usual health.  Developed malaise, shortness of breath.  Reports fever of 101 at home.  None documented in the hospital.  Was admitted after family hypoxemic.  CT scan looking for PE with contrast demonstrated no PE but scattered groundglass infiltrates with predilection for dependent areas in the periphery of my read and review interpretation suspicious for pulmonary edema or atypical infection.  Antibiotics.  He symptomatically improved.  He did require oxygen with ambulation on discharge.  Today he feels improved.  Mostly better.  Discussed findings above.  Discussed concern for possible ILD given groundglass opacities as well as CTA PE protocol obscure finer evaluation.  Recommend additional CT imaging in the future once recovered.  PMH: HLD Surgical History: hernia repair,  Family History: Mother with depression, dementia, Parkinson's Social history: Former smoker, quit around year 2000, lives in [place] / Pulmonary Flowsheets:   ACT:      No data to display          MMRC:     No data to display          Epworth:      No data to display          Tests:   FENO:  No results found for: "NITRICOXIDE"  PFT:     No data to display          WALK:      No data to display          Imaging: Personally reviewed CT Angio Chest PE W and/or Wo Contrast  Result Date: 07/03/2022 CLINICAL DATA:  Pulmonary embolism (PE) suspected, high prob. Fever, fatigue EXAM: CT ANGIOGRAPHY CHEST WITH CONTRAST TECHNIQUE: Multidetector CT imaging of the chest was  performed using the standard protocol during bolus administration of intravenous contrast. Multiplanar CT image reconstructions and MIPs were obtained to evaluate the vascular anatomy. RADIATION DOSE REDUCTION: This exam was performed according to the departmental dose-optimization program which includes automated exposure control, adjustment of the mA and/or kV according to patient size and/or use of iterative reconstruction technique. CONTRAST:  51m OMNIPAQUE IOHEXOL 350 MG/ML SOLN COMPARISON:  None Available. FINDINGS: Cardiovascular: No filling defects in the pulmonary arteries to suggest pulmonary emboli. Cardiomegaly. Coronary artery and aortic calcifications. Mediastinum/Nodes: No mediastinal, hilar, or axillary adenopathy. Trachea and esophagus are unremarkable. Thyroid unremarkable. Lungs/Pleura: Bilateral mid and lower lung ground-glass airspace opacities. No effusions. Upper Abdomen: Gallstones within the gallbladder. Musculoskeletal: Chest wall soft tissues are unremarkable. No acute bony abnormality. Review of the MIP images confirms the above findings. IMPRESSION: No evidence of pulmonary embolus. Cardiomegaly. Bilateral mid and lower lung ground-glass airspace opacities could reflect edema. A chronic lung disease also possible. Coronary artery disease. Cholelithiasis. Aortic Atherosclerosis (ICD10-I70.0). Electronically Signed   By: KRolm BaptiseM.D.   On: 07/03/2022 17:13   DG Chest 2 View  Result Date: 07/03/2022 CLINICAL DATA:  81year old male with history of fever and shortness of breath for the past 3 days. EXAM: CHEST - 2 VIEW COMPARISON:  Chest x-ray 06/19/2021. FINDINGS: Lung volumes are low. Widespread interstitial  prominence increased compared to the prior study, most evident in the mid to lower lungs. No acute consolidative airspace disease. No pleural effusions. No pneumothorax. No evidence of pulmonary edema. Moderate-sized hiatal hernia. Heart size is normal. Upper mediastinal  contours are within normal limits. IMPRESSION: 1. 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 Further evaluation with high-resolution chest CT is recommended at this time to better characterize these findings. 2. Aortic atherosclerosis. Electronically Signed   By: Vinnie Langton M.D.   On: 07/03/2022 11:26    Lab Results: Personally reviewed CBC    Component Value Date/Time   WBC 7.6 07/12/2022 1017   RBC 4.56 07/12/2022 1017   HGB 13.8 07/12/2022 1017   HGB 14.8 06/18/2021 1638   HCT 41.5 07/12/2022 1017   HCT 45.1 06/18/2021 1638   PLT 319.0 07/12/2022 1017   PLT 273 06/18/2021 1638   MCV 91.0 07/12/2022 1017   MCV 90 06/18/2021 1638   MCH 30.6 07/04/2022 0516   MCHC 33.1 07/12/2022 1017   RDW 12.9 07/12/2022 1017   RDW 12.2 06/18/2021 1638   LYMPHSABS 0.9 07/12/2022 1017   LYMPHSABS 1.6 06/18/2021 1638   MONOABS 0.6 07/12/2022 1017   EOSABS 1.0 (H) 07/12/2022 1017   EOSABS 0.4 06/18/2021 1638   BASOSABS 0.1 07/12/2022 1017   BASOSABS 0.1 06/18/2021 1638    BMET    Component Value Date/Time   NA 138 07/12/2022 1017   NA 139 06/18/2021 1638   K 4.0 07/12/2022 1017   CL 100 07/12/2022 1017   CO2 27 07/12/2022 1017   GLUCOSE 138 (H) 07/12/2022 1017   BUN 12 07/12/2022 1017   BUN 15 06/18/2021 1638   CREATININE 1.03 07/12/2022 1017   CALCIUM 9.1 07/12/2022 1017   GFRNONAA >60 07/03/2022 1045    BNP    Component Value Date/Time   BNP 29.4 07/03/2022 1830    ProBNP No results found for: "PROBNP"  Specialty Problems       Pulmonary Problems   Sinus pressure   Seasonal allergic rhinitis due to pollen   Acute respiratory failure with hypoxia (HCC)   CAP (community acquired pneumonia)   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[place])   Hypoxia    Allergies  Allergen Reactions   Augmentin [Amoxicillin-Pot Clavulanate] Other (See Comments)    GI Upset   Zocor [Simvastatin] Other (See Comments)    GI Upset    Immunization  History  Administered Date(s) Administered   Influenza, High Dose Seasonal PF 08/24/2018, 08/04/2019   Influenza-Unspecified 08/06/2021   Moderna SARS-COV2 Booster Vaccination 02/09/2021   Moderna Sars-Covid-2 Vaccination 10/29/2019, 11/27/2019   Pneumococcal Conjugate-13 11/10/2014   Pneumococcal Polysaccharide-23 10/17/2018   Tdap 01/31/2014    Past Medical History:  Diagnosis Date   A-fib ([place]) 10/25/2016   Anxiety attack    Atrial premature complexes    Cancer ([place])    SKIN, ON FACE   Chest pain 10/25/2016   ED (erectile dysfunction)    GERD (gastroesophageal reflux disease)    Hay fever    Hyperlipidemia    Irregular heartbeat    PUD (peptic ulcer disease)    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   [EXPIRED] cefdinir (OMNICEF) 300 MG capsule Take 1 capsule (300 mg total) by mouth 2 (two) times daily for 5 days.   fluticasone (FLONASE) 50 MCG/ACT nasal spray Place 1 spray into both nostrils daily.   Multiple Vitamins-Minerals (ONE-A-DAY 50 PLUS PO) Take 1 tablet by mouth daily.    No facility-administered encounter medications on file as of 07/07/2022.     Review of Systems  Review of Systems  No chest pain with exertion.  No orthopnea or PND.  Comprehensive review of systems otherwise negative. Physical Exam  BP 132/72 (BP Location: Left Arm, Cuff Size: Normal)   Pulse 87   Ht '5\' 10"'$  (1.778 m)   Wt 204 lb 9.6  oz (92.8 kg)   SpO2 95%   BMI 29.36 kg/m   Wt Readings from Last 5 Encounters:  07/11/22 202 lb 9.6 oz (91.9 kg)  07/07/22 204 lb 9.6 oz (92.8 kg)  07/05/22 202 lb 6.4 oz (91.8 kg)  03/31/22 209 lb 12.8 oz (95.2 kg)  09/14/21 207 lb 3.2 oz (94 kg)    BMI Readings from Last 5 Encounters:  07/11/22 29.07 kg/m  07/07/22 29.36 kg/m  07/05/22 29.04 kg/m  03/31/22 30.10 kg/m  09/14/21 29.73 kg/m     Physical Exam General: Sitting in chair, no acute distress Eyes: EOMI, icterus Neck: Supple, no JVP Pulmonary: Clear, normal work of breathing Cardiovascular: Warm, no edema Abdomen: Nondistended, bowel sounds present MSK: No synovitis, no joint effusion Neuro: Normal gait, no weakness Psych: Normal mood, full affect  Assessment & Plan:   Pneumonia: Presented with fever and bilateral infiltrates.  Predominantly peripheral and dependent.  Begs question for pulmonary edema but he improved with antibiotics and no diuresis.  Feeling near back to baseline.  Notably eosinophils mildly elevated.  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  But would not expect improvement with antibiotics alone.  Relatively low suspicion for ILD, high-res CT scan ordered in 6 to 8 weeks after resolution of presumed infectious pneumonia.  Acute hypoxemic respiratory failure: In the setting of presumed pneumonia.  Improving with time.  Encouraged to check oxygen saturations and keep above 88% at rest and with exertion.  Wean as able.   Return in about 2 months (around 09/06/2022).   Lanier Clam, MD 07/17/2022

## 2022-07-18 ENCOUNTER — Ambulatory Visit (INDEPENDENT_AMBULATORY_CARE_PROVIDER_SITE_OTHER): Payer: Medicare HMO | Admitting: Family Medicine

## 2022-07-18 ENCOUNTER — Encounter: Payer: Self-pay | Admitting: Family Medicine

## 2022-07-18 VITALS — BP 124/66 | HR 94 | Temp 98.3°F | Ht 70.0 in | Wt 203.0 lb

## 2022-07-18 DIAGNOSIS — J8281 Chronic eosinophilic pneumonia: Secondary | ICD-10-CM | POA: Diagnosis not present

## 2022-07-18 DIAGNOSIS — Z23 Encounter for immunization: Secondary | ICD-10-CM | POA: Diagnosis not present

## 2022-07-18 NOTE — Progress Notes (Signed)
Established Patient Office Visit  Subjective   Patient ID: Gerald Shelton, male    DOB: 20-Jun-1941  Age: 81 y.o. MRN: [id number]  Chief Complaint  Patient presents with   Follow-up    1 week follow up on breathing patient would like to discuss recent labs. 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     HPI remains dependent on supplemental oxygen.  Has occasional cough.  No fever.  [place]  They are known to carry coccidiomycosis.  Blood work revealed eosinophilia, elevated IgE and a mildly elevated ESR.    Review of Systems  Constitutional: Negative.  Negative for chills and fever.  HENT: Negative.    Eyes:  Negative for blurred vision, discharge and redness.  Respiratory:  Positive for cough, sputum production (occasional) and shortness of breath. Negative for hemoptysis.   Cardiovascular: Negative.   Gastrointestinal:  Negative for abdominal pain.  Genitourinary: Negative.   Musculoskeletal: Negative.  Negative for myalgias.  Skin:  Negative for rash.  Neurological:  Negative for tingling, loss of consciousness and weakness.  Endo/Heme/Allergies:  Negative for polydipsia.      Objective:     BP 124/66 (BP Location: Left Arm, Patient Position: Sitting, Cuff Size: Normal)   Pulse 94   Temp 98.3 F (36.8 C) (Temporal)   Ht '5\' 10"'  (1.778 m)   Wt 203 lb (92.1 kg)   SpO2 92%   BMI 29.13 kg/m    Physical Exam Constitutional:      General: He is not in acute distress.    Appearance: Normal appearance. He is not ill-appearing, toxic-appearing or diaphoretic.  HENT:     Head: Normocephalic and atraumatic.     Right Ear: External ear normal.     Left Ear: External ear normal.     Mouth/Throat:     Mouth: Mucous membranes are moist.     Pharynx: Oropharynx is clear. No oropharyngeal exudate or posterior oropharyngeal erythema.  Eyes:     General: No  scleral icterus.       Right eye: No discharge.        Left eye: No discharge.     Extraocular Movements: Extraocular movements intact.     Conjunctiva/sclera: Conjunctivae normal.     Pupils: Pupils are equal, round, and reactive to light.  Cardiovascular:     Rate and Rhythm: Normal rate and regular rhythm.  Pulmonary:     Effort: Pulmonary effort is normal. No respiratory distress.     Breath sounds: Examination of the left-lower field reveals rales. Rales present.  Abdominal:     General: Bowel sounds are normal.     Tenderness: There is no abdominal tenderness. There is no guarding.  Musculoskeletal:     Cervical back: No rigidity or tenderness.  Skin:    General: Skin is warm and dry.  Neurological:     Mental Status: He is alert and oriented to person, place, and time.  Psychiatric:        Mood and Affect: Mood normal.        Behavior: Behavior normal.      No results found for any visits on 07/18/22.    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  Libby Maw, MD

## 2022-07-20 ENCOUNTER — Telehealth: Payer: Self-pay | Admitting: Family Medicine

## 2022-07-20 NOTE — Telephone Encounter (Signed)
Pt is wanting a cb, he said it's something you were working on and knew about. I asked him to elaborate and he stated you knew already. Please advise pt @ [phone]

## 2022-07-21 ENCOUNTER — Ambulatory Visit (INDEPENDENT_AMBULATORY_CARE_PROVIDER_SITE_OTHER): Payer: Medicare HMO | Admitting: Pulmonary Disease

## 2022-07-21 ENCOUNTER — Encounter (HOSPITAL_COMMUNITY): Payer: Self-pay | Admitting: Pulmonary Disease

## 2022-07-21 ENCOUNTER — Encounter: Payer: Self-pay | Admitting: Pulmonary Disease

## 2022-07-21 VITALS — BP 120/62 | HR 49 | Ht 70.0 in | Wt 200.4 lb

## 2022-07-21 DIAGNOSIS — R0902 Hypoxemia: Secondary | ICD-10-CM

## 2022-07-21 DIAGNOSIS — Z01818 Encounter for other preprocedural examination: Secondary | ICD-10-CM

## 2022-07-21 NOTE — H&P (View-Only) (Signed)
_0  ID: Gerald Shelton, male    DOB: [birth year], 81 y.o.   MRN: [id number]  Chief Complaint  Patient presents with   Follow-up    Pt is here today to talk to [facility] about his diagnosis. Pt has CT scheduled for 09/14/22.     Referring provider: Libby Maw  HPI:   81 y.o. man whom are seen in follow-up for evaluation of pneumonia, abnormal CT.  Most recent PCP note x2 reviewed.    Returns for expedited follow-up at the request of his PCP.  Last seen in clinic about 2 weeks ago.  Since then, hypoxemia has not improved.  Still requiring oxygen.  Getting more congested now.  Worse than prior.  Bringing up a little phlegm.  For the first time feels a little short of breath.  Preceding this he had not had any shortness of breath.  Not in the weeks to months preceding hospitalization.  Really minimal symptoms of this but hospitalized shortly thereafter.  HPI at initial visit: Patient was in usual health.  Developed malaise, shortness of breath.  Reports fever of 101 at home.  None documented in the hospital.  Was admitted after family hypoxemic.  CT scan looking for PE with contrast demonstrated no PE but scattered groundglass infiltrates with predilection for dependent areas in the periphery of my read and review interpretation suspicious for pulmonary edema or atypical infection.  Antibiotics.  He symptomatically improved.  He did require oxygen with ambulation on discharge.  Today he feels improved.  Mostly better.  Discussed findings above.  Discussed concern for possible ILD given groundglass opacities as well as CTA PE protocol obscure finer evaluation.  Recommend additional CT imaging in the future once recovered.  PMH: HLD Surgical History: hernia repair,  Family History: Mother with depression, dementia, Parkinson's Social history: Former smoker, quit around year 2000, lives in [place] / Pulmonary Flowsheets:   ACT:      No data to display            MMRC:     No data to display           Epworth:      No data to display           Tests:   FENO:  No results found for: "NITRICOXIDE"  PFT:     No data to display           WALK:      No data to display           Imaging: Personally reviewed CT Angio Chest PE W and/or Wo Contrast  Result Date: 07/03/2022 CLINICAL DATA:  Pulmonary embolism (PE) suspected, high prob. Fever, fatigue EXAM: CT ANGIOGRAPHY CHEST WITH CONTRAST TECHNIQUE: Multidetector CT imaging of the chest was performed using the standard protocol during bolus administration of intravenous contrast. Multiplanar CT image reconstructions and MIPs were obtained to evaluate the vascular anatomy. RADIATION DOSE REDUCTION: This exam was performed according to the departmental dose-optimization program which includes automated exposure control, adjustment of the mA and/or kV according to patient size and/or use of iterative reconstruction technique. CONTRAST:  26m OMNIPAQUE IOHEXOL 350 MG/ML SOLN COMPARISON:  None Available. FINDINGS: Cardiovascular: No filling defects in the pulmonary arteries to suggest pulmonary emboli. Cardiomegaly. Coronary artery and aortic calcifications. Mediastinum/Nodes: No mediastinal, hilar, or axillary adenopathy. Trachea and esophagus are unremarkable. Thyroid unremarkable. Lungs/Pleura: Bilateral mid and lower lung ground-glass airspace opacities. No effusions. Upper Abdomen: Gallstones within the gallbladder.  Musculoskeletal: Chest wall soft tissues are unremarkable. No acute bony abnormality. Review of the MIP images confirms the above findings. IMPRESSION: No evidence of pulmonary embolus. Cardiomegaly. Bilateral mid and lower lung ground-glass airspace opacities could reflect edema. A chronic lung disease also possible. Coronary artery disease. Cholelithiasis. Aortic Atherosclerosis (ICD10-I70.0). Electronically Signed   By: Rolm Baptise M.D.   On: 07/03/2022 17:13   DG  Chest 2 View  Result Date: 07/03/2022 CLINICAL DATA:  81 year old male with history of fever and shortness of breath for the past 3 days. EXAM: CHEST - 2 VIEW COMPARISON:  Chest x-ray 06/19/2021. FINDINGS: Lung volumes are low. Widespread interstitial prominence increased compared to the prior study, most evident in the mid to lower lungs. No acute consolidative airspace disease. No pleural effusions. No pneumothorax. No evidence of pulmonary edema. Moderate-sized hiatal hernia. Heart size is normal. Upper mediastinal contours are within normal limits. IMPRESSION: 1. 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 Further evaluation with high-resolution chest CT is recommended at this time to better characterize these findings. 2. Aortic atherosclerosis. Electronically Signed   By: Vinnie Langton M.D.   On: 07/03/2022 11:26    Lab Results: Personally reviewed CBC    Component Value Date/Time   WBC 7.6 07/12/2022 1017   RBC 4.56 07/12/2022 1017   HGB 13.8 07/12/2022 1017   HGB 14.8 06/18/2021 1638   HCT 41.5 07/12/2022 1017   HCT 45.1 06/18/2021 1638   PLT 319.0 07/12/2022 1017   PLT 273 06/18/2021 1638   MCV 91.0 07/12/2022 1017   MCV 90 06/18/2021 1638   MCH 30.6 07/04/2022 0516   MCHC 33.1 07/12/2022 1017   RDW 12.9 07/12/2022 1017   RDW 12.2 06/18/2021 1638   LYMPHSABS 0.9 07/12/2022 1017   LYMPHSABS 1.6 06/18/2021 1638   MONOABS 0.6 07/12/2022 1017   EOSABS 1.0 (H) 07/12/2022 1017   EOSABS 0.4 06/18/2021 1638   BASOSABS 0.1 07/12/2022 1017   BASOSABS 0.1 06/18/2021 1638    BMET    Component Value Date/Time   NA 138 07/12/2022 1017   NA 139 06/18/2021 1638   K 4.0 07/12/2022 1017   CL 100 07/12/2022 1017   CO2 27 07/12/2022 1017   GLUCOSE 138 (H) 07/12/2022 1017   BUN 12 07/12/2022 1017   BUN 15 06/18/2021 1638   CREATININE 1.03 07/12/2022 1017   CALCIUM 9.1 07/12/2022 1017   GFRNONAA >60 07/03/2022 1045    BNP     Component Value Date/Time   BNP 29.4 07/03/2022 1830    ProBNP No results found for: "PROBNP"  Specialty Problems       Pulmonary Problems   Sinus pressure   Seasonal allergic rhinitis due to pollen   Acute respiratory failure with hypoxia (HCC)   CAP (community acquired pneumonia)   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[place])   Hypoxia    Allergies  Allergen Reactions   Augmentin [Amoxicillin-Pot Clavulanate] Other (See Comments)    GI Upset   Zocor [Simvastatin] Other (See Comments)    GI Upset    Immunization History  Administered Date(s) Administered   Influenza, High Dose Seasonal PF 08/24/2018, 08/04/2019, 07/18/2022   Influenza-Unspecified 08/06/2021   Moderna SARS-COV2 Booster Vaccination 02/09/2021   Moderna Sars-Covid-2 Vaccination 10/29/2019, 11/27/2019   Pneumococcal Conjugate-13 11/10/2014   Pneumococcal Polysaccharide-23 10/17/2018   Tdap 01/31/2014    Past Medical History:  Diagnosis Date   A-fib ([place]) 10/25/2016   Anxiety attack    Atrial premature complexes  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, ON FACE   Chest pain 10/25/2016   ED (erectile dysfunction)    GERD (gastroesophageal reflux disease)    Hay fever    Hyperlipidemia    Irregular heartbeat    PUD (peptic ulcer disease)    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   fluticasone (FLONASE) 50 MCG/ACT nasal spray Place 1 spray into both nostrils daily.    Multiple Vitamins-Minerals (ONE-A-DAY 50 PLUS PO) Take 1 tablet by mouth daily.    No facility-administered encounter medications on file as of 07/21/2022.     Review of Systems  Review of Systems  N/a Physical Exam  BP 120/62 (BP Location: Left Arm, Patient Position: Sitting, Cuff Size: Normal)   Pulse (!) 49   Ht _0  (1.778 m)   Wt 200 lb 6.4 oz (90.9 kg)   SpO2 93% Comment: on 2L  BMI 28.75 kg/m   Wt Readings from Last 5 Encounters:  07/21/22 200 lb 6.4 oz (90.9 kg)  07/18/22 203 lb (92.1 kg)  07/11/22 202 lb 9.6 oz (91.9 kg)  07/07/22 204 lb 9.6 oz (92.8 kg)  07/05/22 202 lb 6.4 oz (91.8 kg)    BMI Readings from Last 5 Encounters:  07/21/22 28.75 kg/m  07/18/22 29.13 kg/m  07/11/22 29.07 kg/m  07/07/22 29.36 kg/m  07/05/22 29.04 kg/m     Physical Exam General: Sitting in chair, no acute distress Eyes: EOMI, icterus Neck: Supple, no JVP Pulmonary: Bibasilar crackles on inspiration, normal work of breathing on oxygen Cardiovascular: Warm, no edema Abdomen: Nondistended, bowel sounds present MSK: No synovitis, no joint effusion Neuro: Normal gait, no weakness Psych: Normal mood, full affect  Assessment & Plan:   Pneumonia: Presented with fever and bilateral infiltrates.  Predominantly peripheral and dependent.  Begs question for pulmonary edema but he improved with antibiotics and no diuresis.  Initially was feeling back to baseline at time of first visit with me.  Now more congested, developing dyspnea shortness of breath.  Notably eosinophils mildly elevated main so.  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  Given lack of improvement in symptoms and worsening, recommend pursuing bronchoscopy with BAL to evaluate for eosinophilic pneumonia.  He agrees.  New orders today.  Acute hypoxemic respiratory failure: In the setting of presumed pneumonia.  With persistent need for oxygen.  Possible underlying ILD at baseline although not diagnosed prior to  this.  No preceding chest imaging.  Working up pneumonia as above.  Will need repeat cross-sectional imaging in the future.   Return in about 2 weeks (around 08/04/2022).   Lanier Clam, MD 07/21/2022   I spent 41 minutes in care of patient including review of records, face to face care, coordination of care

## 2022-07-21 NOTE — Patient Instructions (Signed)
Nice to see you again  To help further diagnose what is going on I recommend pursuing a bronchoscopy given no improvement in your symptoms.  We will work to get this scheduled next week at [facility].  No changes to medications for now.  Return to clinic in 2 weeks or sooner as needed with Dr. Silas Flood

## 2022-07-21 NOTE — Progress Notes (Signed)
Please schedule the following:  Provider performing procedure:Monice Lundy R Brae Gartman, MD Diagnosis: hypoxemia, eosinophilia Which side if for nodule / mass? N/a Procedure: flexible bronchoscopy with bronchoalveolar lavage Has patient been spoken to by Provider and given informed consent? yes Anesthesia: General Do you need Fluro?  No Duration of procedure: 30 minutes Date: 07/27/2022 Alternate Date: 10/17, 10/19  Time: PM preferred Location: [facility] endoscopy Does patient have OSA? no DM? no Or Latex allergy? no Medication Restriction/ Anticoagulate/Antiplatelet: n/a Pre-op Labs Ordered:determined by Anesthesia Imaging request: n/a  (If, SuperDimension CT Chest, please have STAT courier sent to ENDO)  Please coordinate Pre-op COVID Testing

## 2022-07-21 NOTE — Progress Notes (Signed)
 @Patient ID: Gerald Shelton, male    DOB: 10/09/1941, 81 y.o.   MRN: [id number]  Chief Complaint  Patient presents with   Follow-up    Pt is here today to talk to MH about his diagnosis. Pt has CT scheduled for 09/14/22.     Referring provider: Kremer, William Alfred,*  HPI:   81 y.o. man whom are seen in follow-up for evaluation of pneumonia, abnormal CT.  Most recent PCP note x2 reviewed.    Returns for expedited follow-up at the request of his PCP.  Last seen in clinic about 2 weeks ago.  Since then, hypoxemia has not improved.  Still requiring oxygen.  Getting more congested now.  Worse than prior.  Bringing up a little phlegm.  For the first time feels a little short of breath.  Preceding this he had not had any shortness of breath.  Not in the weeks to months preceding hospitalization.  Really minimal symptoms of this but hospitalized shortly thereafter.  HPI at initial visit: Patient was in usual health.  Developed malaise, shortness of breath.  Reports fever of 101 at home.  None documented in the hospital.  Was admitted after family hypoxemic.  CT scan looking for PE with contrast demonstrated no PE but scattered groundglass infiltrates with predilection for dependent areas in the periphery of my read and review interpretation suspicious for pulmonary edema or atypical infection.  Antibiotics.  He symptomatically improved.  He did require oxygen with ambulation on discharge.  Today he feels improved.  Mostly better.  Discussed findings above.  Discussed concern for possible ILD given groundglass opacities as well as CTA PE protocol obscure finer evaluation.  Recommend additional CT imaging in the future once recovered.  PMH: HLD Surgical History: hernia repair,  Family History: Mother with depression, dementia, Parkinson's Social history: Former smoker, quit around year 2000, [place]  [place] / Pulmonary Flowsheets:   ACT:      No data to display            MMRC:     No data to display           Epworth:      No data to display           Tests:   FENO:  No results found for: "NITRICOXIDE"  PFT:     No data to display           WALK:      No data to display           Imaging: Personally reviewed CT Angio Chest PE W and/or Wo Contrast  Result Date: 07/03/2022 CLINICAL DATA:  Pulmonary embolism (PE) suspected, high prob. Fever, fatigue EXAM: CT ANGIOGRAPHY CHEST WITH CONTRAST TECHNIQUE: Multidetector CT imaging of the chest was performed using the standard protocol during bolus administration of intravenous contrast. Multiplanar CT image reconstructions and MIPs were obtained to evaluate the vascular anatomy. RADIATION DOSE REDUCTION: This exam was performed according to the departmental dose-optimization program which includes automated exposure control, adjustment of the mA and/or kV according to patient size and/or use of iterative reconstruction technique. CONTRAST:  80mL OMNIPAQUE IOHEXOL 350 MG/ML SOLN COMPARISON:  None Available. FINDINGS: Cardiovascular: No filling defects in the pulmonary arteries to suggest pulmonary emboli. Cardiomegaly. Coronary artery and aortic calcifications. Mediastinum/Nodes: No mediastinal, hilar, or axillary adenopathy. Trachea and esophagus are unremarkable. Thyroid unremarkable. Lungs/Pleura: Bilateral mid and lower lung ground-glass airspace opacities. No effusions. Upper Abdomen: Gallstones within the gallbladder.   Musculoskeletal: Chest wall soft tissues are unremarkable. No acute bony abnormality. Review of the MIP images confirms the above findings. IMPRESSION: No evidence of pulmonary embolus. Cardiomegaly. Bilateral mid and lower lung ground-glass airspace opacities could reflect edema. A chronic lung disease also possible. Coronary artery disease. Cholelithiasis. Aortic Atherosclerosis (ICD10-I70.0). Electronically Signed   By: Rolm Baptise M.D.   On: 07/03/2022 17:13   DG  Chest 2 View  Result Date: 07/03/2022 CLINICAL DATA:  82 year old male with history of fever and shortness of breath for the past 3 days. EXAM: CHEST - 2 VIEW COMPARISON:  Chest x-ray 06/19/2021. FINDINGS: Lung volumes are low. Widespread interstitial prominence increased compared to the prior study, most evident in the mid to lower lungs. No acute consolidative airspace disease. No pleural effusions. No pneumothorax. No evidence of pulmonary edema. Moderate-sized hiatal hernia. Heart size is normal. Upper mediastinal contours are within normal limits. IMPRESSION: 1. 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 Further evaluation with high-resolution chest CT is recommended at this time to better characterize these findings. 2. Aortic atherosclerosis. Electronically Signed   By: Vinnie Langton M.D.   On: 07/03/2022 11:26    Lab Results: Personally reviewed CBC    Component Value Date/Time   WBC 7.6 07/12/2022 1017   RBC 4.56 07/12/2022 1017   HGB 13.8 07/12/2022 1017   HGB 14.8 06/18/2021 1638   HCT 41.5 07/12/2022 1017   HCT 45.1 06/18/2021 1638   PLT 319.0 07/12/2022 1017   PLT 273 06/18/2021 1638   MCV 91.0 07/12/2022 1017   MCV 90 06/18/2021 1638   MCH 30.6 07/04/2022 0516   MCHC 33.1 07/12/2022 1017   RDW 12.9 07/12/2022 1017   RDW 12.2 06/18/2021 1638   LYMPHSABS 0.9 07/12/2022 1017   LYMPHSABS 1.6 06/18/2021 1638   MONOABS 0.6 07/12/2022 1017   EOSABS 1.0 (H) 07/12/2022 1017   EOSABS 0.4 06/18/2021 1638   BASOSABS 0.1 07/12/2022 1017   BASOSABS 0.1 06/18/2021 1638    BMET    Component Value Date/Time   NA 138 07/12/2022 1017   NA 139 06/18/2021 1638   K 4.0 07/12/2022 1017   CL 100 07/12/2022 1017   CO2 27 07/12/2022 1017   GLUCOSE 138 (H) 07/12/2022 1017   BUN 12 07/12/2022 1017   BUN 15 06/18/2021 1638   CREATININE 1.03 07/12/2022 1017   CALCIUM 9.1 07/12/2022 1017   GFRNONAA >60 07/03/2022 1045    BNP     Component Value Date/Time   BNP 29.4 07/03/2022 1830    ProBNP No results found for: "PROBNP"  Specialty Problems       Pulmonary Problems   Sinus pressure   Seasonal allergic rhinitis due to pollen   Acute respiratory failure with hypoxia (HCC)   CAP (community acquired pneumonia)   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[place])   Hypoxia    Allergies  Allergen Reactions   Augmentin [Amoxicillin-Pot Clavulanate] Other (See Comments)    GI Upset   Zocor [Simvastatin] Other (See Comments)    GI Upset    Immunization History  Administered Date(s) Administered   Influenza, High Dose Seasonal PF 08/24/2018, 08/04/2019, 07/18/2022   Influenza-Unspecified 08/06/2021   Moderna SARS-COV2 Booster Vaccination 02/09/2021   Moderna Sars-Covid-2 Vaccination 10/29/2019, 11/27/2019   Pneumococcal Conjugate-13 11/10/2014   Pneumococcal Polysaccharide-23 10/17/2018   Tdap 01/31/2014    Past Medical History:  Diagnosis Date   A-fib ([place]) 10/25/2016   Anxiety attack    Atrial premature complexes  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, ON FACE   Chest pain 10/25/2016   ED (erectile dysfunction)    GERD (gastroesophageal reflux disease)    Hay fever    Hyperlipidemia    Irregular heartbeat    PUD (peptic ulcer disease)    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   fluticasone (FLONASE) 50 MCG/ACT nasal spray Place 1 spray into both nostrils daily.    Multiple Vitamins-Minerals (ONE-A-DAY 50 PLUS PO) Take 1 tablet by mouth daily.    No facility-administered encounter medications on file as of 07/21/2022.     Review of Systems  Review of Systems  N/a Physical Exam  BP 120/62 (BP Location: Left Arm, Patient Position: Sitting, Cuff Size: Normal)   Pulse (!) 49   Ht _0  (1.778 m)   Wt 200 lb 6.4 oz (90.9 kg)   SpO2 93% Comment: on 2L  BMI 28.75 kg/m   Wt Readings from Last 5 Encounters:  07/21/22 200 lb 6.4 oz (90.9 kg)  07/18/22 203 lb (92.1 kg)  07/11/22 202 lb 9.6 oz (91.9 kg)  07/07/22 204 lb 9.6 oz (92.8 kg)  07/05/22 202 lb 6.4 oz (91.8 kg)    BMI Readings from Last 5 Encounters:  07/21/22 28.75 kg/m  07/18/22 29.13 kg/m  07/11/22 29.07 kg/m  07/07/22 29.36 kg/m  07/05/22 29.04 kg/m     Physical Exam General: Sitting in chair, no acute distress Eyes: EOMI, icterus Neck: Supple, no JVP Pulmonary: Bibasilar crackles on inspiration, normal work of breathing on oxygen Cardiovascular: Warm, no edema Abdomen: Nondistended, bowel sounds present MSK: No synovitis, no joint effusion Neuro: Normal gait, no weakness Psych: Normal mood, full affect  Assessment & Plan:   Pneumonia: Presented with fever and bilateral infiltrates.  Predominantly peripheral and dependent.  Begs question for pulmonary edema but he improved with antibiotics and no diuresis.  Initially was feeling back to baseline at time of first visit with me.  Now more congested, developing dyspnea shortness of breath.  Notably eosinophils mildly elevated main so.  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  Given lack of improvement in symptoms and worsening, recommend pursuing bronchoscopy with BAL to evaluate for eosinophilic pneumonia.  He agrees.  New orders today.  Acute hypoxemic respiratory failure: In the setting of presumed pneumonia.  With persistent need for oxygen.  Possible underlying ILD at baseline although not diagnosed prior to  this.  No preceding chest imaging.  Working up pneumonia as above.  Will need repeat cross-sectional imaging in the future.   Return in about 2 weeks (around 08/04/2022).   Lanier Clam, MD 07/21/2022   I spent 41 minutes in care of patient including review of records, face to face care, coordination of care

## 2022-07-21 NOTE — Addendum Note (Signed)
Addended byLarey Days on: 07/21/2022 11:25 AM   Modules accepted: Orders

## 2022-07-25 ENCOUNTER — Other Ambulatory Visit: Payer: Medicare HMO

## 2022-07-25 DIAGNOSIS — Z01818 Encounter for other preprocedural examination: Secondary | ICD-10-CM

## 2022-07-27 ENCOUNTER — Other Ambulatory Visit: Payer: Self-pay

## 2022-07-27 LAB — SPECIMEN STATUS REPORT

## 2022-07-27 LAB — NOVEL CORONAVIRUS, NAA: SARS-CoV-2, NAA: NOT DETECTED

## 2022-07-28 ENCOUNTER — Other Ambulatory Visit: Payer: Self-pay

## 2022-07-28 ENCOUNTER — Ambulatory Visit (HOSPITAL_COMMUNITY)
Admission: RE | Admit: 2022-07-28 | Discharge: 2022-07-28 | Disposition: A | Discharge status: Home / Self Care | Payer: Medicare HMO | Attending: Pulmonary Disease | Admitting: Pulmonary Disease

## 2022-07-28 ENCOUNTER — Encounter (HOSPITAL_COMMUNITY): Payer: Self-pay | Admitting: Pulmonary Disease

## 2022-07-28 ENCOUNTER — Encounter (HOSPITAL_COMMUNITY)
Admission: RE | Disposition: A | Discharge status: Home / Self Care | Payer: Self-pay | Source: Home / Self Care | Attending: Pulmonary Disease

## 2022-07-28 ENCOUNTER — Ambulatory Visit (HOSPITAL_BASED_OUTPATIENT_CLINIC_OR_DEPARTMENT_OTHER): Payer: Medicare HMO | Admitting: Certified Registered Nurse Anesthetist

## 2022-07-28 ENCOUNTER — Telehealth: Payer: Self-pay | Admitting: Pulmonary Disease

## 2022-07-28 ENCOUNTER — Ambulatory Visit (HOSPITAL_COMMUNITY): Payer: Medicare HMO | Admitting: Certified Registered Nurse Anesthetist

## 2022-07-28 DIAGNOSIS — R0902 Hypoxemia: Secondary | ICD-10-CM

## 2022-07-28 DIAGNOSIS — J9601 Acute respiratory failure with hypoxia: Secondary | ICD-10-CM

## 2022-07-28 DIAGNOSIS — F419 Anxiety disorder, unspecified: Secondary | ICD-10-CM

## 2022-07-28 DIAGNOSIS — Z87891 Personal history of nicotine dependence: Secondary | ICD-10-CM | POA: Insufficient documentation

## 2022-07-28 DIAGNOSIS — R053 Chronic cough: Secondary | ICD-10-CM | POA: Insufficient documentation

## 2022-07-28 DIAGNOSIS — D721 Eosinophilia, unspecified: Secondary | ICD-10-CM | POA: Diagnosis not present

## 2022-07-28 DIAGNOSIS — R918 Other nonspecific abnormal finding of lung field: Secondary | ICD-10-CM | POA: Diagnosis not present

## 2022-07-28 DIAGNOSIS — R059 Cough, unspecified: Secondary | ICD-10-CM | POA: Diagnosis not present

## 2022-07-28 DIAGNOSIS — K219 Gastro-esophageal reflux disease without esophagitis: Secondary | ICD-10-CM | POA: Diagnosis not present

## 2022-07-28 HISTORY — PX: BRONCHIAL WASHINGS: SHX5105

## 2022-07-28 HISTORY — PX: VIDEO BRONCHOSCOPY: SHX5072

## 2022-07-28 LAB — BODY FLUID CELL COUNT WITH DIFFERENTIAL
Eos, Fluid: 44 %
Lymphs, Fluid: 28 %
Monocyte-Macrophage-Serous Fluid: 26 % — ABNORMAL LOW (ref 50–90)
Neutrophil Count, Fluid: 2 % (ref 0–25)
Total Nucleated Cell Count, Fluid: 175 cu mm (ref 0–1000)

## 2022-07-28 SURGERY — VIDEO BRONCHOSCOPY WITHOUT FLUORO
Anesthesia: General

## 2022-07-28 MED ORDER — ONDANSETRON HCL 4 MG/2ML IJ SOLN
INTRAMUSCULAR | Status: DC | PRN
  Administered 2022-07-28: 4 mg via INTRAVENOUS

## 2022-07-28 MED ORDER — PROPOFOL 10 MG/ML IV BOLUS
INTRAVENOUS | Status: DC | PRN
  Administered 2022-07-28: 160 mg via INTRAVENOUS

## 2022-07-28 MED ORDER — LIDOCAINE HCL (CARDIAC) PF 100 MG/5ML IV SOSY
PREFILLED_SYRINGE | INTRAVENOUS | Status: DC | PRN
  Administered 2022-07-28: 100 mg via INTRAVENOUS

## 2022-07-28 MED ORDER — PREDNISONE 20 MG PO TABS: ORAL_TABLET | ORAL | 0 refills | Status: AC

## 2022-07-28 MED ORDER — SUGAMMADEX SODIUM 200 MG/2ML IV SOLN
INTRAVENOUS | Status: DC | PRN
  Administered 2022-07-28: 200 mg via INTRAVENOUS

## 2022-07-28 MED ORDER — DEXAMETHASONE SODIUM PHOSPHATE 10 MG/ML IJ SOLN
INTRAMUSCULAR | Status: DC | PRN
  Administered 2022-07-28: 10 mg via INTRAVENOUS

## 2022-07-28 MED ORDER — ROCURONIUM BROMIDE 10 MG/ML (PF) SYRINGE
PREFILLED_SYRINGE | INTRAVENOUS | Status: DC | PRN
  Administered 2022-07-28: 40 mg via INTRAVENOUS

## 2022-07-28 MED ORDER — LACTATED RINGERS IV SOLN: INTRAVENOUS | Status: DC

## 2022-07-28 NOTE — Discharge Instructions (Signed)
Flexible Bronchoscopy A person with an outline of the lungs and a close-up showing a bronchoscope.   Flexible bronchoscopy is a procedure used to examine the passageways in the lungs. During the procedure, a thin, flexible tool with a camera (bronchoscope) is passed into the mouth or nose, down through the windpipe (trachea), and into the air tubes in the lungs (bronchi). This tool allows the health care provider to look inside the lungs and to take samples for testing, if needed. Tell a health care provider about: Any allergies you have. All medicines you are taking, including vitamins, herbs, eye drops, creams, and over-the-counter medicines. Any problems you or family members have had with anesthetic medicines. Any bleeding problems you have. Any surgeries you have had. Any medical conditions you have. Whether you are pregnant or may be pregnant. What are the risks? Your healthcare provider will talk with you about risks. These may include: Infection. Bleeding. Damage to other structures or organs. Allergic reactions to medicines. Collapsed lung (pneumothorax). Increased need for oxygen or difficulty breathing after the procedure. What happens before the procedure? When to stop eating and drinking Clear liquid drinks, including water, tea, coffee, and juice.   Follow instructions from your health care provider about what you may eat and drink. These may include: 8 hours before your procedure Stop eating most foods. Do not eat meat, fried foods, or fatty foods. Eat only light foods, such as toast or crackers. All liquids are okay except energy drinks and alcohol. 6 hours before your procedure Stop eating. Drink only clear liquids, such as water, clear fruit juice, black coffee, plain tea, and sports drinks. Do not drink energy drinks or alcohol. 2 hours before your procedure Stop drinking all liquids. You may be allowed to take medicines with small sips of water. If you do not  follow your health care provider's instructions, your procedure may be delayed or canceled. Medicines Ask your health care provider about: Changing or stopping your regular medicines. These include any diabetes medicines or blood thinners you take. Taking medicines such as aspirin and ibuprofen. These medicines can thin your blood. Do not take them unless your health care provider tells you to. Taking over-the-counter medicines, vitamins, herbs, and supplements. General instructions You may be given antibiotic medicine to help lower the risk of infection. If you will be going home right after the procedure, plan to have a responsible adult: Take you home from the hospital or clinic. You will not be allowed to drive. Care for you for the time you are told. What happens during the procedure? An IV will be inserted into one of your veins. You will be given a medicine (local anesthetic) to numb your mouth, nose, throat, and voice box (larynx). You may also be given one or more of the following: A medicine to help you relax (sedative). A medicine to control coughing. A medicine to dry up any fluids or secretions in your lungs. A bronchoscope will be passed into your nose or mouth, and into your lungs. Your health care provider will examine your lungs. Samples of airway secretions may be collected for testing. If abnormal areas are seen in your airways, samples of tissue may be removed and checked under a microscope (biopsy). If tissue samples are needed from the outer parts of the lung, a type of X-ray (fluoroscopy) may be used to guide the bronchoscope to these areas. If bleeding occurs, you may be given medicine to stop or decrease the bleeding. The procedure may  vary among health care providers and hospitals. What happens after the procedure? Your blood pressure, heart rate, breathing rate, and blood oxygen level will be monitored until you leave the hospital or clinic. You may have a chest  X-ray to check for signs of pneumothorax. You willnot be allowed to eat or drink anything for 2 hours after your procedure. If a biopsy was taken, it is up to you to get the results of the test. Ask your health care provider, or the department that is doing the procedure, when your results will be ready. Contact a health care provider if: You have a fever. Get help right away if: You have shortness of breath that gets worse. You get light-headed or feel like you might faint. You have chest pain. You cough up more than a small amount of blood. These symptoms may be an emergency. Get help right away. Call 911. Do not wait to see if the symptoms will go away. Do not drive yourself to the hospital. Summary Flexible bronchoscopy is a procedure that allows your health care provider to look closely inside your lungs and to take testing samples if needed. Risks of flexible bronchoscopy include bleeding, infection, and collapsed lung (pneumothorax). Before the procedure, you will be given a medicine to numb your mouth, nose, throat, and voice box. Then, a bronchoscope will be passed into your nose or mouth, and into your lungs. After the procedure, your blood pressure, heart rate, breathing rate, and blood oxygen level will be monitored until you leave the hospital or clinic. You may have a chest X-ray to check for signs of pneumothorax. This information is not intended to replace advice given to you by your health care provider. Make sure you discuss any questions you have with your health care provider. Document Revised: 01/04/2022 Document Reviewed: 01/04/2022 Elsevier Patient Education  Gerald Shelton.

## 2022-07-28 NOTE — Op Note (Signed)
Bronchoscopy Procedure Note  Ryon Layton  [id number]  May 20, 1941  Date:07/28/22  Time:12:05 PM   Provider Performing:Shantavia Jha R Ayanah Snader   Procedure(s):  Flexible bronchoscopy with bronchial alveolar lavage (50037)  Indication(s) Cough, hypoxemia, infiltrates on CT  Consent Risks of the procedure as well as the alternatives and risks of each were explained to the patient and/or caregiver.  Consent for the procedure was obtained and is signed in the bedside chart  Anesthesia general   Time Out Verified patient identification, verified procedure, site/side was marked, verified correct patient position, special equipment/implants available, medications/allergies/relevant history reviewed, required imaging and test results available.   Sterile Technique Usual hand hygiene, masks, gowns, and gloves were used   Procedure Description Bronchoscope advanced through endotracheal tube and into airway.  Airways were examined down to subsegmental level with findings noted below.   Following diagnostic evaluation, BAL(s) performed in RML lateral segment with normal saline and return of 25 cc cloudy, pink tinged fluid  Findings: BAL RML, scattered inflammation, normal anatomy   Complications/Tolerance None; patient tolerated the procedure well. Chest X-ray is not needed post procedure.   EBL none   Specimen(s) Micro, cell count, cytology

## 2022-07-28 NOTE — Transfer of Care (Signed)
Immediate Anesthesia Transfer of Care Note  Patient: Gerald Shelton  Procedure(s) Performed: VIDEO BRONCHOSCOPY WITHOUT FLUORO BRONCHIAL WASHINGS  Patient Location: Endoscopy Unit  Anesthesia Type:General  Level of Consciousness: awake, alert , oriented and patient cooperative  Airway & Oxygen Therapy: Patient Spontanous Breathing and Patient connected to face mask oxygen  Post-op Assessment: Report given to RN and Post -op Vital signs reviewed and stable  Post vital signs: Reviewed and stable  Last Vitals:  Vitals Value Taken Time  BP 164/63 07/28/22 1210  Temp 36.3 C 07/28/22 1209  Pulse 77 07/28/22 1212  Resp 18 07/28/22 1212  SpO2 94 % 07/28/22 1212  Vitals shown include unvalidated device data.  Last Pain:  Vitals:   07/28/22 1209  TempSrc: Tympanic  PainSc: Asleep         Complications: No notable events documented.

## 2022-07-28 NOTE — Telephone Encounter (Signed)
Eosinophil percent 44% on BAL cell count.  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  Called and discussed results with patient.  Prednisone 40 mg daily (approximate 0.5 mg/kg) for 2 weeks followed by 20 mg daily for 2 weeks with subsequent plan slower taper thereafter to treat this was sent to his preferred pharmacy.  He expressed understanding.  Discussed it is possible his oxygen need will decrease or even resolve with steroid therapy.  Encouraged him to continue to check his oxygen saturation at home with goal O2 saturation 88% or higher.  Can decrease as able to maintain 88%.  Can we place a new order to [employer] (DME/oxygen company) for POC 4 L, O2 saturation 85% with exertion, 93% on 4 L pulsed.

## 2022-07-28 NOTE — Interval H&P Note (Signed)
    Gerald Shelton has been scheduled for Procedure(s): VIDEO BRONCHOSCOPY WITHOUT FLUORO (N/A) today. The various methods of treatment have been discussed with the patient. After consideration of the risks, benefits and treatment options the patient has consented to the planned procedure.   The patient has been seen and labs reviewed. There are no changes in the patient's condition to prevent proceeding with the planned procedure today.  Recent labs:  Lab Results  Component Value Date   WBC 7.6 07/12/2022   HGB 13.8 07/12/2022   HCT 41.5 07/12/2022   PLT 319.0 07/12/2022   GLUCOSE 138 (H) 07/12/2022   CHOL 138 03/31/2022   TRIG 269.0 (H) 03/31/2022   HDL 44.20 03/31/2022   LDLDIRECT 67.0 07/05/2022   LDLCALC 151 (H) 09/16/2021   ALT 23 07/12/2022   AST 22 07/12/2022   NA 138 07/12/2022   K 4.0 07/12/2022   CL 100 07/12/2022   CREATININE 1.03 07/12/2022   BUN 12 07/12/2022   CO2 27 07/12/2022   TSH 1.59 03/23/2020   PSA 0.69 11/13/2019    Lanier Clam, MD 07/28/2022 9:58 AM

## 2022-07-28 NOTE — Anesthesia Procedure Notes (Addendum)
Procedure Name: Intubation Date/Time: 07/28/2022 11:50 AM  Performed by: Janeece Riggers, MDPre-anesthesia Checklist: Patient identified, Emergency Drugs available, Suction available and Patient being monitored Patient Re-evaluated:Patient Re-evaluated prior to induction Oxygen Delivery Method: Circle system utilized Preoxygenation: Pre-oxygenation with 100% oxygen Induction Type: IV induction Ventilation: Mask ventilation without difficulty Laryngoscope Size: Glidescope and 4 Grade View: Grade I Tube type: Oral Tube size: 8.0 mm Number of attempts: 3 Airway Equipment and Method: Stylet and Video-laryngoscopy Placement Confirmation: ETT inserted through vocal cords under direct vision, positive ETCO2 and breath sounds checked- equal and bilateral Secured at: 24 cm Tube secured with: Tape Dental Injury: Teeth and Oropharynx as per pre-operative assessment  Comments: Anterior airway. DL x2 with MAC 4 by CRNA; Grade 2 view with no success. Easy mask ventilation. Attempt with Glidescope 4 my MDA with Grade 1 view; success. No issues noted.

## 2022-07-28 NOTE — Telephone Encounter (Signed)
Pt has 02 tank. Dr Ethelene Hal sent order for POC but the DME is saying the pt will need a post dosage and liter flow as well, and PCP does not know that. Can MH put in the order instead? DME is [employer]. States if it is easier, we can Epic chat her.

## 2022-07-28 NOTE — Brief Op Note (Signed)
Procedure: Flexible bronchoscopy with bronchoalveolar lavage  Findings: Scattered inflammation. BAL RML.  Specimens: BAL for micro, cell count, cytology  EBL: none

## 2022-07-28 NOTE — Op Note (Signed)
[facility] Cardiopulmonary Patient Name: Gerald Shelton Procedure Date: 07/28/2022 MRN: [id number] Attending MD: Lanier Clam MD,  Date of Birth: [birth year] CSN: [id number] Age: 81 Admit Type: Inpatient Ethnicity: Not Hispanic or Latino Procedure:             Bronchoscopy Indications:           Diffuse infiltrate, Chronic cough with abnormal CT Providers:             Bonna Gains. Ustin Cruickshank MD, Benay Pillow, RN, Benetta Spar, Technician Referring MD:           Medicines:             General Anesthesia, See the Anesthesia note for                         documentation of the administered medications Complications:         No immediate complications Estimated Blood Loss:  Estimated blood loss: none. Procedure:      Pre-Anesthesia Assessment:      - Universal Protocol:      - Pre-procedure Verification: Prior to the procedure, the patient's       identity was verified by full name, date of birth and medical record       number. The patient's identity was verified on all pertinent medical       records, including History and Physical. Also prior to the procedure, a       History and Physical was performed, and patient medications, allergies       and sensitivities were reviewed. The patient's tolerance of previous       anesthesia was reviewed. The risks and benefits of the procedure and the       sedation options and risks were discussed with the patient. All       questions were answered and informed consent was obtained.      - Time-Out: Prior to the start of the procedure, the patient's       identification, proposed procedure, accurate signed consent, correctly       labeled images and records, and need for prophylactic antibiotics were       verified by the physician, the nurse and the technician in the endoscopy       suite.      - ASA Grade Assessment: III - A patient with severe systemic disease.      After obtaining informed  consent, the bronchoscope was passed under       direct vision. Throughout the procedure, the patient's blood pressure,       pulse, and oxygen saturations were monitored continuously. the BF-[id number]       ([id number]) Olympus bronchoscope was introduced through the nose, via the       endotracheal tube (the patient was intubated for the procedure) and       advanced to the tracheobronchial tree. The procedure was accomplished       without difficulty. The patient tolerated the procedure well. The total       duration of the procedure was 5 minutes. Findings:      The endotracheal tube is in good position. The visualized portion of the       trachea is of normal caliber. The carina is sharp. The tracheobronchial  tree was examined to at least the first subsegmental level. Bronchial       mucosa and anatomy are normal; there are no endobronchial lesions, and       faint thick white secretions at carina and bronchus intermedius. Mild       scattered inflammatory changes noted.      Bronchoalveolar lavage was performed in the RML lateral segment (B4) of       the lung. 100 mL of fluid were instilled. 25 mL were returned. The       return was cloudy. Mucous plugs were present in the return fluid. Impression:      - The airway examination was normal.      - Bronchoalveolar lavage was performed. Moderate Sedation:      N/A: per anesthesia care Recommendation:      - Discharge patient to home (ambulatory).      - Await test results. Procedure Code(s):      --- Professional ---      [phone], Bronchoscopy, rigid or flexible, including fluoroscopic guidance,       when performed; with bronchial alveolar lavage Diagnosis Code(s):      --- Professional ---      R91.8, Other nonspecific abnormal finding of lung field      R05, Cough CPT copyright 2019 American Medical Association. All rights reserved. The codes documented in this report are preliminary and upon coder review may  be revised to  meet current compliance requirements. [facility] Lanier Clam MD,  07/28/2022 12:20:48 PM Number of Addenda: 0 Scope In: Scope Out:

## 2022-07-28 NOTE — Anesthesia Preprocedure Evaluation (Signed)
Anesthesia Evaluation  Patient identified by MRN, date of birth, ID band Patient awake    Reviewed: Allergy & Precautions, H&P , NPO status , Patient's Chart, lab work & pertinent test results  Airway Mallampati: I  TM Distance: >3 FB Neck ROM: Full    Dental no notable dental hx. (+) Dental Advisory Given, Edentulous Upper, Edentulous Lower, Upper Dentures, Lower Dentures   Pulmonary neg pulmonary ROS, former smoker,  Interstitial lung disease   Pulmonary exam normal breath sounds clear to auscultation       Cardiovascular Exercise Tolerance: Good negative cardio ROS Normal cardiovascular exam Rhythm:Regular Rate:Normal     Neuro/Psych  Headaches, PSYCHIATRIC DISORDERS Anxiety    GI/Hepatic Neg liver ROS, GERD  Medicated and Controlled,  Endo/Other  negative endocrine ROS  Renal/GU negative Renal ROS  negative genitourinary   Musculoskeletal negative musculoskeletal ROS (+)   Abdominal   Peds negative pediatric ROS (+)  Hematology negative hematology ROS (+)   Anesthesia Other Findings   Reproductive/Obstetrics negative OB ROS                             Anesthesia Physical Anesthesia Plan  ASA: 3  Anesthesia Plan: General   Post-op Pain Management: Minimal or no pain anticipated   Induction: Intravenous  PONV Risk Score and Plan: 2 and Treatment may vary due to age or medical condition, Ondansetron and Dexamethasone  Airway Management Planned: Oral ETT  Additional Equipment: None  Intra-op Plan:   Post-operative Plan: Extubation in OR  Informed Consent: I have reviewed the patients History and Physical, chart, labs and discussed the procedure including the risks, benefits and alternatives for the proposed anesthesia with the patient or authorized representative who has indicated his/her understanding and acceptance.       Plan Discussed with: Anesthesiologist and  CRNA  Anesthesia Plan Comments:         Anesthesia Quick Evaluation

## 2022-07-28 NOTE — Telephone Encounter (Signed)
Since PCP placed order for POC I will have to walk patient in office on office visit with [place] on 08/08/2022 to determine if he does qualify for POC. And if so I ca adjust the order setting with the correct dosage and liter flow. Pt has office visit on 10/30 with J[place] will walk then in office on POC to see if he does qualify. Nothing further needed

## 2022-07-29 LAB — PNEUMOCYSTIS JIROVECI SMEAR BY DFA: Pneumocystis jiroveci Ag: NEGATIVE

## 2022-07-29 LAB — CYTOLOGY - NON PAP

## 2022-07-29 LAB — ACID FAST SMEAR (AFB, MYCOBACTERIA): Acid Fast Smear: NEGATIVE

## 2022-07-29 NOTE — Anesthesia Postprocedure Evaluation (Signed)
Anesthesia Post Note  Patient: Jeralene Peters  Procedure(s) Performed: [place] WITHOUT FLUORO BRONCHIAL WASHINGS     Patient location during evaluation: Endoscopy Anesthesia Type: General Level of consciousness: awake and alert Pain management: pain level controlled Vital Signs Assessment: post-procedure vital signs reviewed and stable Respiratory status: spontaneous breathing, nonlabored ventilation and respiratory function stable Cardiovascular status: blood pressure returned to baseline and stable Postop Assessment: no apparent nausea or vomiting Anesthetic complications: no   No notable events documented.  Last Vitals:  Vitals:   07/28/22 1229 07/28/22 1239  BP: 127/64 100/83  Pulse: 67 66  Resp: 16 13  Temp:    SpO2: 98% 96%    Last Pain:  Vitals:   07/28/22 1239  TempSrc:   PainSc: 0-No pain                 Lenee Franze

## 2022-07-31 LAB — CULTURE, RESPIRATORY W GRAM STAIN: Culture: NO GROWTH

## 2022-08-01 ENCOUNTER — Telehealth: Payer: Self-pay | Admitting: Pulmonary Disease

## 2022-08-01 ENCOUNTER — Encounter (HOSPITAL_COMMUNITY): Payer: Self-pay | Admitting: Pulmonary Disease

## 2022-08-01 NOTE — Telephone Encounter (Signed)
Late entry returned patients call went over pulmonology appointment and informed status of portal oxygen tank.

## 2022-08-01 NOTE — Telephone Encounter (Signed)
Called and notified patient that we are just waiting on Dr Silas Flood to sign the order so I can get it sent off to [employer]. He verbalized understanding. Nothing further needed

## 2022-08-03 ENCOUNTER — Encounter: Payer: Self-pay | Admitting: Family Medicine

## 2022-08-03 ENCOUNTER — Ambulatory Visit (INDEPENDENT_AMBULATORY_CARE_PROVIDER_SITE_OTHER): Payer: Medicare HMO | Admitting: Family Medicine

## 2022-08-03 VITALS — BP 122/78 | HR 69 | Temp 97.1°F | Ht 70.0 in | Wt 202.2 lb

## 2022-08-03 DIAGNOSIS — J984 Other disorders of lung: Secondary | ICD-10-CM | POA: Diagnosis not present

## 2022-08-03 NOTE — Progress Notes (Signed)
Established Patient Office Visit  Subjective   Patient ID: Gerald Shelton, male    DOB: January 17, 1941  Age: 81 y.o. MRN: [id number]  Chief Complaint  Patient presents with   Follow-up    4 week follow up, discuss recommended vaccines which should patient have.     HPI status post bronchoscopy and work-up for eosinophilic pneumonitis.  All cultures have been negative.  Started on a prolonged taper of prednisone.  Doing relatively well with it.  Increased energy with it.  He does have all allergies.  We discussed that this could be the source of his elevated eosinophils.  Seems to be doing a little better.    Review of Systems  Constitutional: Negative.   HENT: Negative.    Eyes:  Negative for blurred vision, discharge and redness.  Respiratory:  Positive for shortness of breath. Negative for hemoptysis, sputum production and wheezing.   Cardiovascular: Negative.   Gastrointestinal:  Negative for abdominal pain.  Genitourinary: Negative.   Musculoskeletal: Negative.  Negative for myalgias.  Skin:  Negative for rash.  Neurological:  Negative for tingling, loss of consciousness and weakness.  Endo/Heme/Allergies:  Negative for polydipsia.      Objective:     BP 122/78 (BP Location: Left Arm, Patient Position: Sitting, Cuff Size: Normal)   Pulse 69   Temp (!) 97.1 F (36.2 C) (Temporal)   Ht '5\' 10"'$  (1.778 m)   Wt 202 lb 3.2 oz (91.7 kg)   SpO2 95% Comment: on 2 liters of oxygen  BMI 29.01 kg/m    Physical Exam Constitutional:      General: He is not in acute distress.    Appearance: Normal appearance. He is not ill-appearing, toxic-appearing or diaphoretic.  HENT:     Head: Normocephalic and atraumatic.     Right Ear: External ear normal.     Left Ear: External ear normal.     Mouth/Throat:     Mouth: Mucous membranes are moist.     Pharynx: Oropharynx is clear. No oropharyngeal exudate or posterior oropharyngeal erythema.  Eyes:     General: No scleral icterus.        Right eye: No discharge.        Left eye: No discharge.     Extraocular Movements: Extraocular movements intact.     Conjunctiva/sclera: Conjunctivae normal.     Pupils: Pupils are equal, round, and reactive to light.  Cardiovascular:     Rate and Rhythm: Normal rate and regular rhythm.  Pulmonary:     Effort: Pulmonary effort is normal. No respiratory distress.     Breath sounds: Normal breath sounds.     Comments: Crackles at bases. Abdominal:     General: Bowel sounds are normal.     Tenderness: There is no abdominal tenderness. There is no guarding.  Musculoskeletal:     Cervical back: No rigidity or tenderness.  Skin:    General: Skin is warm and dry.  Neurological:     Mental Status: He is alert and oriented to person, place, and time.  Psychiatric:        Mood and Affect: Mood normal.        Behavior: Behavior normal.      No results found for any visits on 08/03/22.    The 10-year ASCVD risk score (Arnett DK, et al., 2019) is: 27.8%    Assessment & Plan:   Problem List Items Addressed This Visit   None Visit Diagnoses     Pneumonitis    -  Primary       Return in about 3 months (around 11/03/2022).   Advised patient to have the fall COVID and RSV vaccines.  Continue follow-up with pulmonology. Libby Maw, MD

## 2022-08-04 ENCOUNTER — Telehealth: Payer: Self-pay | Admitting: Pulmonary Disease

## 2022-08-04 DIAGNOSIS — J9611 Chronic respiratory failure with hypoxia: Secondary | ICD-10-CM

## 2022-08-04 NOTE — Telephone Encounter (Signed)
yes

## 2022-08-04 NOTE — Telephone Encounter (Signed)
Called and spoke with patient. He was calling to check on the status of the POC order. I advised him that the order was placed on 10/19 but was never sent due to the type of encounter it was in. I advised him I would place the order and try to get it signed today by another provider. He verbalized understanding.   TP, are you ok with signing his oxygen order on behalf of Dr. Silas Flood? Thanks!

## 2022-08-04 NOTE — Telephone Encounter (Signed)
Order has been placed for TP to sign.   Nothing further needed at time of call.

## 2022-08-08 ENCOUNTER — Telehealth: Payer: Self-pay | Admitting: Pulmonary Disease

## 2022-08-08 ENCOUNTER — Ambulatory Visit (INDEPENDENT_AMBULATORY_CARE_PROVIDER_SITE_OTHER): Payer: Medicare HMO | Admitting: Pulmonary Disease

## 2022-08-08 ENCOUNTER — Encounter: Payer: Self-pay | Admitting: Pulmonary Disease

## 2022-08-08 VITALS — BP 110/56 | HR 80 | Temp 97.9°F | Ht 70.0 in | Wt 203.8 lb

## 2022-08-08 DIAGNOSIS — J8281 Chronic eosinophilic pneumonia: Secondary | ICD-10-CM | POA: Diagnosis not present

## 2022-08-08 DIAGNOSIS — R0902 Hypoxemia: Secondary | ICD-10-CM

## 2022-08-08 MED ORDER — PREDNISONE 10 MG PO TABS: ORAL_TABLET | ORAL | 0 refills | Status: AC

## 2022-08-08 NOTE — Progress Notes (Signed)
_0  ID: Gerald Shelton, male    DOB: 1940/11/13, 81 y.o.   MRN: [id number]  Chief Complaint  Patient presents with   Follow-up    PT Follow-up, breathing is ok per PT    Referring provider: Libby Maw  HPI:   81 y.o. man whom are seen in follow-up for evaluation of pneumonia, abnormal CT.    At last visit, decided to proceed with bronchoscopy given ongoing symptoms especially in setting of peripheral mild eosinophilia and eosinophilic pneumonia.  This yielded a result of 44% eosinophils on BAL fluid.  He was started on prednisone 40 mg daily with plan to decrease to 20 mg daily after 2 weeks.  He will go down to 20 mg daily on Friday.  Some mild improvement in his symptoms.  Oxygen numbers that he reports seems slightly better overall.  Still requiring supplemental oxygen.  In the process of obtaining a POC.  HPI at initial visit: Patient was in usual health.  Developed malaise, shortness of breath.  Reports fever of 101 at home.  None documented in the hospital.  Was admitted after family hypoxemic.  CT scan looking for PE with contrast demonstrated no PE but scattered groundglass infiltrates with predilection for dependent areas in the periphery of my read and review interpretation suspicious for pulmonary edema or atypical infection.  Antibiotics.  He symptomatically improved.  He did require oxygen with ambulation on discharge.  Today he feels improved.  Mostly better.  Discussed findings above.  Discussed concern for possible ILD given groundglass opacities as well as CTA PE protocol obscure finer evaluation.  Recommend additional CT imaging in the future once recovered.  PMH: HLD Surgical History: hernia repair,  Family History: Mother with depression, dementia, Parkinson's Social history: Former smoker, quit around year 2000, lives in [place] / Pulmonary Flowsheets:   ACT:      No data to display           MMRC:     No data to  display           Epworth:      No data to display           Tests:   FENO:  No results found for: "NITRICOXIDE"  PFT:     No data to display           WALK:      No data to display           Imaging: Personally reviewed No results found.  Lab Results: Personally reviewed CBC    Component Value Date/Time   WBC 7.6 07/12/2022 1017   RBC 4.56 07/12/2022 1017   HGB 13.8 07/12/2022 1017   HGB 14.8 06/18/2021 1638   HCT 41.5 07/12/2022 1017   HCT 45.1 06/18/2021 1638   PLT 319.0 07/12/2022 1017   PLT 273 06/18/2021 1638   MCV 91.0 07/12/2022 1017   MCV 90 06/18/2021 1638   MCH 30.6 07/04/2022 0516   MCHC 33.1 07/12/2022 1017   RDW 12.9 07/12/2022 1017   RDW 12.2 06/18/2021 1638   LYMPHSABS 0.9 07/12/2022 1017   LYMPHSABS 1.6 06/18/2021 1638   MONOABS 0.6 07/12/2022 1017   EOSABS 1.0 (H) 07/12/2022 1017   EOSABS 0.4 06/18/2021 1638   BASOSABS 0.1 07/12/2022 1017   BASOSABS 0.1 06/18/2021 1638    BMET    Component Value Date/Time   NA 138 07/12/2022 1017   NA 139 06/18/2021 1638   K 4.0  07/12/2022 1017   CL 100 07/12/2022 1017   CO2 27 07/12/2022 1017   GLUCOSE 138 (H) 07/12/2022 1017   BUN 12 07/12/2022 1017   BUN 15 06/18/2021 1638   CREATININE 1.03 07/12/2022 1017   CALCIUM 9.1 07/12/2022 1017   GFRNONAA >60 07/03/2022 1045    BNP    Component Value Date/Time   BNP 29.4 07/03/2022 1830    ProBNP No results found for: "PROBNP"  Specialty Problems       Pulmonary Problems   Sinus pressure   Seasonal allergic rhinitis due to pollen   Acute respiratory failure with hypoxia (HCC)   CAP (community acquired pneumonia)   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[place])   Hypoxia    Allergies  Allergen Reactions   Augmentin [Amoxicillin-Pot Clavulanate] Other (See Comments)    GI Upset   Zocor [Simvastatin] Other (See Comments)    GI Upset    Immunization History  Administered Date(s) Administered   Influenza, High Dose Seasonal  PF 08/24/2018, 08/04/2019, 07/18/2022   Influenza-Unspecified 08/06/2021   Moderna SARS-COV2 Booster Vaccination 02/09/2021   Moderna Sars-Covid-2 Vaccination 10/29/2019, 11/27/2019   Pneumococcal Conjugate-13 11/10/2014   Pneumococcal Polysaccharide-23 10/17/2018   Tdap 01/31/2014    Past Medical History:  Diagnosis Date   A-fib ([place]) 10/25/2016   per pt not afib PAC's   Anxiety attack    Atrial premature complexes    Cancer ([place])    SKIN, ON FACE   Chest pain 10/25/2016   ED (erectile dysfunction)    GERD (gastroesophageal reflux disease)    Hay fever    Hyperlipidemia    Irregular heartbeat    PUD (peptic ulcer disease)    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   fluticasone (FLONASE) 50 MCG/ACT nasal spray Place 1 spray into both nostrils daily as needed for allergies.   Multiple Vitamin (MULTIVITAMIN WITH MINERALS) TABS tablet Take 1 tablet by mouth in the morning. One A Day   predniSONE (DELTASONE) 10 MG tablet Take 1.5 tablets (15 mg total) by mouth daily with breakfast for 28 days, THEN 1 tablet (10 mg total) daily with breakfast for 28 days, THEN 0.5 tablets (5 mg total) daily with breakfast for 28 days.   predniSONE (DELTASONE) 20 MG tablet Take 2 tablets (40 mg total) by mouth daily with breakfast for 14 days, THEN 1 tablet (20 mg total) daily with breakfast for 14 days.   No facility-administered encounter medications on file as of 08/08/2022.     Review  of Systems  Review of Systems  N/a Physical Exam  BP (!) 110/56 (BP Location: Right Arm, Patient Position: Sitting, Cuff Size: Normal)   Pulse 80   Temp 97.9 F (36.6 C) (Oral)   Ht _0  (1.778 m)   Wt 203 lb 12.8 oz (92.4 kg)   SpO2 93% Comment: 2L [place]  BMI 29.24 kg/m   Wt Readings from Last 5 Encounters:  08/08/22 203 lb 12.8 oz (92.4 kg)  08/03/22 202 lb  3.2 oz (91.7 kg)  07/28/22 194 lb (88 kg)  07/21/22 200 lb 6.4 oz (90.9 kg)  07/18/22 203 lb (92.1 kg)    BMI Readings from Last 5 Encounters:  08/08/22 29.24 kg/m  08/03/22 29.01 kg/m  07/28/22 27.84 kg/m  07/21/22 28.75 kg/m  07/18/22 29.13 kg/m     Physical Exam General: Sitting in chair, no acute distress Eyes: EOMI, icterus Neck: Supple, no JVP Pulmonary: Bibasilar crackles on inspiration, normal work of breathing on oxygen Cardiovascular: Warm, no edema Abdomen: Nondistended, bowel sounds present MSK: No synovitis, no joint effusion Neuro: Normal gait, no weakness Psych: Normal mood, full affect  Assessment & Plan:   Chronic Eosinophilic Pneumonia: Presented with fever and bilateral infiltrates.  Predominantly peripheral and dependent.  Begs question for pulmonary edema but he improved with antibiotics and no diuresis.  Initially was feeling back to baseline at time of first visit with me.  Now more congested, developing dyspnea shortness of breath.  Notably eosinophils mildly elevated on blood counts.  Bronchoscopy performed with 44% eosinophils confirming chronic eosinophilic pneumonia.  Plan 40 mg for 2 weeks, then 20 mg for 2 weeks, then 50 mg for 4 weeks, 10 mg/week, 5 mg for 4 weeks then stop.  Acute hypoxemic respiratory failure: In the setting of presumed pneumonia.  With persistent need for oxygen.  High suspicion for underlying lung disease, emphysema versus ILD given appearance of CT scan at hospitalization.  This likely puts him at risk for chronic hypoxemic respiratory failure.  High-res CT  scan of the chest scheduled for December 2023 for evaluation.    Return in about 6 weeks (around 09/19/2022).   Lanier Clam, MD 08/08/2022  I spent 43 minutes in care of patient reviewing review of records, coordination of care, face-to-face visit.

## 2022-08-08 NOTE — Patient Instructions (Addendum)
Nice to see you again  Continue the prednisone as planned.  Complete 2 weeks of 20 mg once you decrease later this week.  Then, you will take 15 mg for 4 weeks, 10 mg for 4 weeks, 5 mg for 4 weeks then stop.  We will see what the CT scan shows in December.  Return to clinic in 6 weeks or sooner as needed with Dr. Silas Flood

## 2022-08-10 DIAGNOSIS — I10 Essential (primary) hypertension: Secondary | ICD-10-CM | POA: Diagnosis not present

## 2022-08-10 DIAGNOSIS — R0902 Hypoxemia: Secondary | ICD-10-CM | POA: Diagnosis not present

## 2022-08-10 DIAGNOSIS — R5383 Other fatigue: Secondary | ICD-10-CM | POA: Diagnosis not present

## 2022-08-10 DIAGNOSIS — J449 Chronic obstructive pulmonary disease, unspecified: Secondary | ICD-10-CM | POA: Diagnosis not present

## 2022-08-10 DIAGNOSIS — Z9181 History of falling: Secondary | ICD-10-CM

## 2022-08-12 NOTE — Telephone Encounter (Signed)
I am showing that pt's insurance has authorized this.

## 2022-08-18 ENCOUNTER — Ambulatory Visit: Payer: Medicare HMO

## 2022-08-18 VITALS — Ht 70.0 in | Wt 194.5 lb

## 2022-08-18 NOTE — Progress Notes (Signed)
Annual Wellness Visit     Patient: Gerald Shelton, Male    DOB: 15-Feb-1941, 81 y.o.   MRN: [id number]  Subjective  Chief Complaint  Patient presents with   medicare annual    AWV    Gerald Shelton is a 81 y.o. male who presents today for his Annual Wellness Visit. He reports consuming a general diet. The patient does not participate in regular exercise at present. He generally feels fairly well. He reports sleeping well. He does not have additional problems to discuss today.   HPI Na        Medications: Outpatient Medications Prior to Visit  Medication Sig   atorvastatin (LIPITOR) 20 MG tablet TAKE 1 TABLET BY MOUTH EVERY DAY   augmented betamethasone dipropionate (DIPROLENE-AF) 0.05 % cream Apply 1 Application topically daily as needed (itching/rash.).   Multiple Vitamin (MULTIVITAMIN WITH MINERALS) TABS tablet Take 1 tablet by mouth in the morning. One A Day   predniSONE (DELTASONE) 10 MG tablet Take 1.5 tablets (15 mg total) by mouth daily with breakfast for 28 days, THEN 1 tablet (10 mg total) daily with breakfast for 28 days, THEN 0.5 tablets (5 mg total) daily with breakfast for 28 days.   predniSONE (DELTASONE) 20 MG tablet Take 2 tablets (40 mg total) by mouth daily with breakfast for 14 days, THEN 1 tablet (20 mg total) daily with breakfast for 14 days.   fluticasone (FLONASE) 50 MCG/ACT nasal spray Place 1 spray into both nostrils daily as needed for allergies. (Patient not taking: Reported on 08/18/2022)   No facility-administered medications prior to visit.    Allergies  Allergen Reactions   Augmentin [Amoxicillin-Pot Clavulanate] Other (See Comments)    GI Upset   Zocor [Simvastatin] Other (See Comments)    GI Upset    Patient Care Team: Libby Maw, MD as PCP - General (Family Medicine) Constance Haw, MD as PCP - Electrophysiology (Cardiology) Mauri Pole, MD as Consulting Physician (Gastroenterology) Rosemary Holms, DPM as  Consulting Physician (Podiatry) Kathrene Bongo, DMD (Dentistry)       Subjective:   Gerald Shelton is a 81 y.o. male who presents for Medicare Annual/Subsequent preventive examination. I connected with  Gerald Shelton on 08/18/22 by a video enabled telemedicine application and verified that I am speaking with the correct person using two identifiers.   I discussed the limitations of evaluation and management by telemedicine. The patient expressed understanding and agreed to proceed.  Location of Patient: home Location of Provider:  office Persons included in visit: Gerald Shelton, and Gerald Shelton, CMA  Review of Systems    na       Objective:    Today's Vitals   08/18/22 1427 08/18/22 1525  Weight: 194 lb 8 oz (88.2 kg)   Height: '5\' 10"'$  (1.778 m)   PainSc: 2  2    Body mass index is 27.91 kg/m.     07/28/2022   10:00 AM 07/03/2022   10:40 AM 05/06/2020    2:19 PM 10/17/2018   10:18 AM  Advanced Directives  Does Patient Have a Medical Advance Directive? Yes No Yes Yes  Type of Paramedic of [place];Living will  [place];Living will [place];Living will  Does patient want to make changes to medical advance directive?    No - Patient declined  Copy of [place] in Chart? No - copy requested  No - copy requested No - copy requested  Would patient like information on creating a medical advance directive?  No - Patient declined      Current Medications (verified) Outpatient Encounter Medications as of 08/18/2022  Medication Sig   atorvastatin (LIPITOR) 20 MG tablet TAKE 1 TABLET BY MOUTH EVERY DAY   augmented betamethasone dipropionate (DIPROLENE-AF) 0.05 % cream Apply 1 Application topically daily as needed (itching/rash.).   Multiple Vitamin (MULTIVITAMIN WITH MINERALS) TABS tablet Take 1 tablet by mouth in the morning. One A Day   predniSONE (DELTASONE) 10 MG tablet Take 1.5 tablets (15 mg  total) by mouth daily with breakfast for 28 days, THEN 1 tablet (10 mg total) daily with breakfast for 28 days, THEN 0.5 tablets (5 mg total) daily with breakfast for 28 days.   predniSONE (DELTASONE) 20 MG tablet Take 2 tablets (40 mg total) by mouth daily with breakfast for 14 days, THEN 1 tablet (20 mg total) daily with breakfast for 14 days.   fluticasone (FLONASE) 50 MCG/ACT nasal spray Place 1 spray into both nostrils daily as needed for allergies. (Patient not taking: Reported on 08/18/2022)   No facility-administered encounter medications on file as of 08/18/2022.    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   Past Surgical History:  Procedure Laterality Date   BRONCHIAL WASHINGS  07/28/2022   Procedure: BRONCHIAL WASHINGS;  Surgeon: Lanier Clam, MD;  Location: [facility];  Service: Endoscopy;;   EYE SURGERY     cataract removed form right eye   HERNIA REPAIR     x2   NASAL SEPTUM SURGERY     VIDEO BRONCHOSCOPY N/A 07/28/2022   Procedure: VIDEO BRONCHOSCOPY WITHOUT FLUORO;  Surgeon: Lanier Clam, MD;  Location: [facility];  Service: Endoscopy;  Laterality: N/A;   Family History  Problem Relation Age of Onset   Diabetes Son    Parkinson's disease Mother    Depression Mother    Dementia Mother    Social History   Socioeconomic History   Marital status: Married    Spouse name: Not on file   Number of children: 6   Years of education: Not on file   Highest education level: Not on file   Occupational History   Occupation: Retired  Tobacco Use   Smoking status: Former    Types: Cigarettes    Quit date: 10/25/1998    Years since quitting: 23.8   Smokeless tobacco: Never  Vaping Use   Vaping Use: Never used  Substance and Sexual Activity   Alcohol use: Yes    Comment: RARE   Drug use: No   Sexual activity: Not Currently  Other Topics Concern   Not on file  Social History Narrative   Not on file   Social Determinants of Health   Financial Resource Strain: Low Risk  (05/06/2020)   Overall Financial Resource Strain (CARDIA)    Difficulty of Paying Living Expenses: Not hard at all  Food Insecurity: No Food Insecurity (05/06/2020)   Hunger  Vital Sign    Worried About Charity fundraiser in the Last Year: Never true    Ran Out of Food in the Last Year: Never true  Transportation Needs: No Transportation Needs (05/06/2020)   PRAPARE - Hydrologist (Medical): No    Lack of Transportation (Non-Medical): No  Physical Activity: Insufficiently Active (05/06/2020)   Exercise Vital Sign    Days of Exercise per Week: 3 days    Minutes of Exercise per Session: 30 min  Stress: No Stress Concern Present (05/06/2020)   [place]    Feeling of Stress : Not at all  Social Connections: Moderately Integrated (05/06/2020)   Social Connection and Isolation Panel [NHANES]    Frequency of Communication with Friends and Family: More than three times a week    Frequency of Social Gatherings with Friends and Family: More than three times a week    Attends Religious Services: Never    Marine scientist or Organizations: Yes    Attends Music therapist: More than 4 times per year    Marital Status: Married    Tobacco Counseling Counseling given: Not Answered   Clinical Intake:  Pre-visit preparation completed: Yes  Pain : 0-10 Pain Score: 2  Pain Type: Chronic  pain Pain Location: Other (Comment) (feet) Pain Orientation: Left, Right Pain Descriptors / Indicators: Discomfort, Dull (when walking) Pain Onset: More than a month ago Pain Frequency: Occasional     BMI - recorded: 29.01 Nutritional Status: BMI 25 -29 Overweight Diabetes: No  How often do you need to have someone help you when you read instructions, pamphlets, or other written materials from your doctor or pharmacy?: 1 - Never What is the last grade level you completed in school?: 2 years of college  Diabetic?no  Interpreter Needed?: No  Information entered by :: Gerald Shelton, [place]   Activities of Daily Living    08/17/2022    5:25 PM  In your present state of health, do you have any difficulty performing the following activities:  Hearing? 1  Vision? 0  Difficulty concentrating or making decisions? 0  Walking or climbing stairs? 0  Dressing or bathing? 0  Doing errands, shopping? 0  Preparing Food and eating ? N  Using the Toilet? N  In the past six months, have you accidently leaked urine? N  Do you have problems with loss of bowel control? N  Managing your Medications? N  Managing your Finances? N  Housekeeping or managing your Housekeeping? N    Patient Care Team: Libby Maw, MD as PCP - General (Family Medicine) Constance Haw, MD as PCP - Electrophysiology (Cardiology) Mauri Pole, MD as Consulting Physician (Gastroenterology) Rosemary Holms, DPM as Consulting Physician (Podiatry) Kathrene Bongo, DMD (Dentistry)  Indicate any recent Medical Services you may have received from other than [facility] providers in the past year (date may be approximate).     Assessment:   This is a routine wellness examination for Gerald Shelton.  Hearing/Vision screen No results found.  Dietary issues and exercise activities discussed:     Goals Addressed   None    Depression Screen    08/03/2022    9:57 AM 07/18/2022    2:20 PM 07/11/2022    3:59  PM 07/05/2022   10:46 AM 03/31/2022   10:04 AM 09/14/2021    1:28 PM 09/14/2021    1:06 PM  PHQ 2/9  Scores  PHQ - 2 Score 0 0 0 0 0 0 0  PHQ- 9 Score      0     Fall Risk    08/17/2022    5:25 PM 08/03/2022    9:57 AM 07/18/2022    2:20 PM 07/11/2022    3:59 PM 07/05/2022   10:46 AM  Fall Risk   Falls in the past year? 0 0 0 0 0  Number falls in past yr:  0 0 0 0  Injury with Fall?    0     FALL RISK PREVENTION PERTAINING TO THE HOME:  Any stairs in or around the home? Yes  If so, are there any without handrails? No  Home free of loose throw rugs in walkways, pet beds, electrical cords, etc? No  Adequate lighting in your home to reduce risk of falls? Yes   ASSISTIVE DEVICES UTILIZED TO PREVENT FALLS:  Life alert? No  Use of a cane, walker or w/c? No  Grab bars in the bathroom? No  Shower chair or bench in shower? Yes  Elevated toilet seat or a handicapped toilet? No   TIMED UP AND GO:  Was the test performed? No .  Length of time to ambulate 10 feet: na sec.     Cognitive Function:        05/06/2020    2:30 PM  6CIT Screen  What Year? 0 points  What month? 0 points  What time? 0 points  Count back from 20 0 points  Months in reverse 0 points  Repeat phrase 0 points  Total Score 0 points    Immunizations Immunization History  Administered Date(s) Administered   Influenza, High Dose Seasonal PF 08/24/2018, 08/04/2019, 07/18/2022   Influenza-Unspecified 08/06/2021   Moderna SARS-COV2 Booster Vaccination 02/09/2021   Moderna Sars-Covid-2 Vaccination 10/29/2019, 11/27/2019   Pneumococcal Conjugate-13 11/10/2014   Pneumococcal Polysaccharide-23 10/17/2018   Tdap 01/31/2014    TDAP status: Up to date  Flu Vaccine status: Up to date  Pneumococcal vaccine status: Up to date  Covid-19 vaccine status: Completed vaccines  Qualifies for Shingles Vaccine? Yes   Zostavax completed Yes   Shingrix Completed?: Yes  Screening Tests Health Maintenance  Topic  Date Due   Hepatitis C Screening  Never done   Medicare Annual Wellness (AWV)  05/06/2021   TETANUS/TDAP  02/01/2024   Pneumonia Vaccine 55+ Years old  Completed   INFLUENZA VACCINE  Completed   HPV VACCINES  Aged Out   COVID-19 Vaccine  Discontinued   Zoster Vaccines- Shingrix  Discontinued    Health Maintenance  Health Maintenance Due  Topic Date Due   Hepatitis C Screening  Never done   Medicare Annual Wellness (AWV)  05/06/2021    Colorectal cancer screening: Type of screening: Colonoscopy. Completed 08/11/2018. Repeat every 10 years  Lung Cancer Screening: (Low Dose CT Chest recommended if Age 39-80 years, 30 pack-year currently smoking OR have quit w/in 15years.) does not qualify.   Lung Cancer Screening Referral: na  Additional Screening:  Hepatitis C Screening: does not qualify  Vision Screening: Recommended annual ophthalmology exams for early detection of glaucoma and other disorders of the eye. Is the patient up to date with their annual eye exam?  Yes  Who is the provider or what is the name of the office in which the patient attends annual eye exams? [facility] [place] [facility] If pt is not established with a provider, would they like  to be referred to a provider to establish care? No .   Dental Screening: Recommended annual dental exams for proper oral hygiene  Community Resource Referral / Chronic Care Management: CRR required this visit?  No   CCM required this visit?  No      Plan:     I have personally reviewed and noted the following in the patient's chart:   Medical and social history Use of alcohol, tobacco or illicit drugs  Current medications and supplements including opioid prescriptions. Patient is not currently taking opioid prescriptions. Functional ability and status Nutritional status Physical activity Advanced directives List of other physicians Hospitalizations, surgeries, and ER visits in previous 12  months Vitals Screenings to include cognitive, depression, and falls Referrals and appointments  In addition, I have reviewed and discussed with patient certain preventive protocols, quality metrics, and best practice recommendations. A written personalized care plan for preventive services as well as general preventive health recommendations were provided to patient.     Konrad Saha, [place]   08/18/2022   Nurse Notes: Non--Face to Face  40 minute visit encounter   Gerald Shelton , Thank you for taking time to come for your Medicare Wellness Visit. I appreciate your ongoing commitment to your health goals. Please review the following plan we discussed and let me know if I can assist you in the future.   These are the goals we discussed:  Goals      DIET - EAT MORE FRUITS AND VEGETABLES     Increase physical activity     Continue going to the gym 3x per week        This is a list of the screening recommended for you and due dates:  Health Maintenance  Topic Date Due   Hepatitis C Screening: USPSTF Recommendation to screen - Ages 75-79 yo.  Never done   Medicare Annual Wellness Visit  05/06/2021   Tetanus Vaccine  02/01/2024   Pneumonia Vaccine  Completed   Flu Shot  Completed   HPV Vaccine  Aged Out   COVID-19 Vaccine  Discontinued   Zoster (Shingles) Vaccine  Discontinued           Objective  Ht '5\' 10"'$  (1.778 m)   Wt 194 lb 8 oz (88.2 kg)   BMI 27.91 kg/m    Physical Exam NA   Most recent functional status assessment:    08/17/2022    5:25 PM  In your present state of health, do you have any difficulty performing the following activities:  Hearing? 1  Vision? 0  Difficulty concentrating or making decisions? 0  Walking or climbing stairs? 0  Dressing or bathing? 0  Doing errands, shopping? 0  Preparing Food and eating ? N  Using the Toilet? N  In the past six months, have you accidently leaked urine? N  Do you have problems with loss of bowel control?  N  Managing your Medications? N  Managing your Finances? N  Housekeeping or managing your Housekeeping? N   Most recent fall risk assessment:    08/17/2022    5:25 PM  Fall Risk   Falls in the past year? 0    Most recent depression screenings:    08/03/2022    9:57 AM 07/18/2022    2:20 PM  PHQ 2/9 Scores  PHQ - 2 Score 0 0   Most recent cognitive screening:    05/06/2020    2:30 PM  6CIT Screen  What Year? 0 points  What month? 0 points  What time? 0 points  Count back from 20 0 points  Months in reverse 0 points  Repeat phrase 0 points  Total Score 0 points   Most recent Audit-C alcohol use screening    08/17/2022    5:25 PM  Alcohol Use Disorder Test (AUDIT)  1. How often do you have a drink containing alcohol? 0   A score of 3 or more in women, and 4 or more in men indicates increased risk for alcohol abuse, EXCEPT if all of the points are from question 1   Vision/Hearing Screen: No results found.    No results found for any visits on 08/18/22.    Assessment & Plan   Annual wellness visit done today including the all of the following: Reviewed patient's Family Medical History Reviewed and updated list of patient's medical providers Assessment of cognitive impairment was done Assessed patient's functional ability Established a written schedule for health screening [place] Completed and Reviewed  Exercise Activities and Dietary recommendations  Goals      DIET - EAT MORE FRUITS AND VEGETABLES     Increase physical activity     Continue going to the gym 3x per week        Immunization History  Administered Date(s) Administered   Influenza, High Dose Seasonal PF 08/24/2018, 08/04/2019, 07/18/2022   Influenza-Unspecified 08/06/2021   Moderna SARS-COV2 Booster Vaccination 02/09/2021   Moderna Sars-Covid-2 Vaccination 10/29/2019, 11/27/2019   Pneumococcal Conjugate-13 11/10/2014   Pneumococcal Polysaccharide-23 10/17/2018    Tdap 01/31/2014    Health Maintenance  Topic Date Due   Hepatitis C Screening  Never done   Medicare Annual Wellness (AWV)  05/06/2021   TETANUS/TDAP  02/01/2024   Pneumonia Vaccine 72+ Years old  Completed   INFLUENZA VACCINE  Completed   HPV VACCINES  Aged Out   COVID-19 Vaccine  Discontinued   Zoster Vaccines- Shingrix  Discontinued     Discussed health benefits of physical activity, and encouraged him to engage in regular exercise appropriate for his age and condition.    Problem List Items Addressed This Visit       Other   Medicare annual wellness visit, subsequent - Primary    No follow-ups on file.     Konrad Saha, CMA

## 2022-08-19 ENCOUNTER — Encounter: Payer: Self-pay | Admitting: Family Medicine

## 2022-08-23 ENCOUNTER — Other Ambulatory Visit: Payer: Self-pay | Admitting: Pulmonary Disease

## 2022-08-26 ENCOUNTER — Other Ambulatory Visit: Payer: Self-pay | Admitting: Family Medicine

## 2022-08-26 DIAGNOSIS — E782 Mixed hyperlipidemia: Secondary | ICD-10-CM

## 2022-09-14 ENCOUNTER — Ambulatory Visit (HOSPITAL_COMMUNITY)
Admission: RE | Admit: 2022-09-14 | Discharge: 2022-09-14 | Disposition: A | Discharge status: Home / Self Care | Payer: Medicare HMO | Source: Ambulatory Visit | Attending: Pulmonary Disease | Admitting: Pulmonary Disease

## 2022-09-14 ENCOUNTER — Encounter: Payer: Self-pay | Admitting: Pulmonary Disease

## 2022-09-14 ENCOUNTER — Ambulatory Visit (INDEPENDENT_AMBULATORY_CARE_PROVIDER_SITE_OTHER): Payer: Medicare HMO | Admitting: Pulmonary Disease

## 2022-09-14 VITALS — BP 120/70 | HR 71 | Temp 98.5°F | Wt 198.0 lb

## 2022-09-14 DIAGNOSIS — J849 Interstitial pulmonary disease, unspecified: Secondary | ICD-10-CM | POA: Diagnosis not present

## 2022-09-14 DIAGNOSIS — J9611 Chronic respiratory failure with hypoxia: Secondary | ICD-10-CM | POA: Insufficient documentation

## 2022-09-14 NOTE — Progress Notes (Addendum)
_0  ID: Gerald Shelton, male    DOB: August 22, 1941, 81 y.o.   MRN: [id number]  Chief Complaint  Patient presents with   Follow-up    Pt is here for follow up for hypoxia. Pt had his CT scan done earlier today. He is on POC    Referring provider: Libby Maw  HPI:   81 y.o. 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    Returns for scheduled follow-up.  Continues on prednisone taper for eosinophilic pneumonia.  Symptoms seem to be improving at last visit, really improved for a week or 2.  Now plateaued.  He does not really feel short of breath but he remains hypoxemic.  Notes his oxygen will drop to the mid 80s with exertion.  Again, curiously, he denies any shortness of breath or dyspnea on exertion.  He has CT high-resolution obtained this morning.  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  Prior infiltrates have essentially resolved.  Long discussion today regarding role of antifibrotic's and potential immunosuppressants depending etiology of ILD.  Discussed moving forward with antifibrotic's at this time which she is amenable to.  He would like to seek a second opinion and a referral was made to academic center.  HPI at initial visit: Patient was in usual health.  Developed malaise, shortness of breath.  Reports fever of 101 at home.  None documented in the hospital.  Was admitted after family hypoxemic.  CT scan looking for PE with contrast demonstrated no PE but scattered groundglass infiltrates with predilection for dependent areas in the periphery of my read and review interpretation suspicious for pulmonary edema or  atypical infection.  Antibiotics.  He symptomatically improved.  He did require oxygen with ambulation on discharge.  Today he feels improved.  Mostly better.  Discussed findings above.  Discussed concern for possible ILD given groundglass opacities as well as CTA PE protocol obscure finer evaluation.  Recommend additional CT imaging in the future once recovered.  PMH: HLD Surgical History: hernia repair,  Family History: Mother with depression, dementia, Parkinson's Social history: Former smoker, quit around year 2000, lives in [place] / Pulmonary Flowsheets:   ACT:      No data to display           MMRC:     No data to display           Epworth:      No data to display           Tests:   FENO:  No results found for: "NITRICOXIDE"  PFT:     No data to display           WALK:      No data to display           Imaging: Personally reviewed No results found.  Lab Results: Personally reviewed CBC    Component Value Date/Time   WBC 7.6 07/12/2022 1017   RBC 4.56 07/12/2022 1017   HGB 13.8 07/12/2022 1017   HGB 14.8 06/18/2021 1638   HCT 41.5 07/12/2022 1017   HCT 45.1 06/18/2021 1638   PLT 319.0 07/12/2022 1017   PLT 273 06/18/2021 1638  MCV 91.0 07/12/2022 1017   MCV 90 06/18/2021 1638   MCH 30.6 07/04/2022 0516   MCHC 33.1 07/12/2022 1017   RDW 12.9 07/12/2022 1017   RDW 12.2 06/18/2021 1638   LYMPHSABS 0.9 07/12/2022 1017   LYMPHSABS 1.6 06/18/2021 1638   MONOABS 0.6 07/12/2022 1017   EOSABS 1.0 (H) 07/12/2022 1017   EOSABS 0.4 06/18/2021 1638   BASOSABS 0.1 07/12/2022 1017   BASOSABS 0.1 06/18/2021 1638    BMET    Component Value Date/Time   NA 138 07/12/2022 1017   NA 139 06/18/2021 1638   K 4.0 07/12/2022 1017   CL 100 07/12/2022 1017   CO2 27 07/12/2022 1017   GLUCOSE 138 (H) 07/12/2022 1017   BUN 12 07/12/2022 1017   BUN 15 06/18/2021 1638   CREATININE 1.03 07/12/2022 1017   CALCIUM 9.1  07/12/2022 1017   GFRNONAA >60 07/03/2022 1045    BNP    Component Value Date/Time   BNP 29.4 07/03/2022 1830    ProBNP No results found for: "PROBNP"  Specialty Problems       Pulmonary Problems   Sinus pressure   Seasonal allergic rhinitis due to pollen   Acute respiratory failure with hypoxia ([place])   CAP (community acquired pneumonia)   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[place])   Hypoxia    Allergies  Allergen Reactions   Augmentin [Amoxicillin-Pot Clavulanate] Other (See Comments)    GI Upset   Zocor [Simvastatin] Other (See Comments)    GI Upset    Immunization History  Administered Date(s) Administered   Influenza, High Dose Seasonal PF 08/24/2018, 08/04/2019, 07/18/2022   Influenza-Unspecified 08/06/2021   Moderna SARS-COV2 Booster Vaccination 02/09/2021, 07/21/2021   Moderna Sars-Covid-2 Vaccination 10/29/2019, 11/27/2019, 08/09/2020, 02/09/2021   Pneumococcal Conjugate-13 11/10/2014   Pneumococcal Polysaccharide-23 10/17/2018   Tdap 01/31/2014   Zoster Recombinat (Shingrix) 07/15/2019, 10/10/2019    Past Medical History:  Diagnosis Date   A-fib ([place]) 10/25/2016   per pt not afib PAC's   Anxiety attack    Atrial premature complexes    Cancer ([place])    SKIN, ON FACE   Chest pain 10/25/2016   ED (erectile dysfunction)    GERD (gastroesophageal reflux disease)    Hay fever    Hyperlipidemia    Irregular heartbeat    PUD (peptic ulcer disease)    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    Tobacco History: Social History   Tobacco Use  Smoking Status Former   Types: Cigarettes   Quit date: 10/25/1998   Years since quitting: 23.9  Smokeless Tobacco Never   Counseling given: Not Answered   Continue to not smoke  Outpatient Encounter Medications as of 09/14/2022  Medication Sig   atorvastatin (LIPITOR) 20 MG tablet TAKE 1 TABLET BY MOUTH EVERY DAY   augmented betamethasone dipropionate (DIPROLENE-AF) 0.05 % cream Apply 1 Application  topically daily as needed (itching/rash.).   fluticasone (FLONASE) 50 MCG/ACT nasal spray Place 1 spray into both nostrils daily as needed for allergies.   Multiple Vitamin (MULTIVITAMIN WITH MINERALS) TABS tablet Take 1 tablet by mouth in the morning. One A Day   predniSONE (DELTASONE) 10 MG tablet Take 1.5 tablets (15 mg total) by mouth daily with breakfast for 28 days, THEN 1 tablet (10 mg total) daily with breakfast for 28 days, THEN 0.5 tablets (5 mg total) daily with breakfast for 28 days.   No facility-administered encounter medications on file as of 09/14/2022.     Review of Systems  Review of Systems  N/a Physical Exam  BP 120/70 (BP Location: Left Arm, Patient Position: Sitting, Cuff Size: Normal)   Pulse 71   Temp 98.5 F (36.9 C) (Oral)   Wt 198 lb (89.8 kg)   SpO2 92%   BMI 28.41 kg/m   Wt Readings from Last 5 Encounters:  09/14/22 198 lb (89.8 kg)  08/18/22 194 lb 8 oz (88.2 kg)  08/08/22 203 lb 12.8 oz (92.4 kg)  08/03/22 202 lb 3.2 oz (91.7 kg)  07/28/22 194 lb (88 kg)    BMI Readings from Last 5 Encounters:  09/14/22 28.41 kg/m  08/18/22 27.91 kg/m  08/08/22 29.24 kg/m  08/03/22 29.01 kg/m  07/28/22 27.84 kg/m     Physical Exam General: Sitting in chair, no acute distress Eyes: EOMI, icterus Neck: Supple, no JVP Pulmonary: Bibasilar crackles on inspiration, normal work of breathing on oxygen Cardiovascular: Warm, no edema Abdomen: Nondistended, bowel sounds present MSK: No synovitis, no joint effusion Neuro: Normal gait, no weakness Psych: Normal mood, full affect  Assessment & Plan:   Chronic Eosinophilic Pneumonia: Presented with fever and bilateral infiltrates.  Predominantly peripheral and dependent.  Begs question for pulmonary edema but he improved with antibiotics and no diuresis.  Initially was feeling back to baseline at time of first visit with me.  Now more congested, developing dyspnea shortness of breath.  Notably eosinophils  mildly elevated on blood counts.  Bronchoscopy performed 07/2022 with 44% eosinophils confirming chronic eosinophilic pneumonia.  He continues to taper his prednisone currently on 50 mg, decreasing by 5 mg every 4 weeks at this time.  Acute hypoxemic respiratory failure: In the setting of presumed pneumonia.  With persistent need for oxygen despite radiographic improvement.  High suspicion for underlying lung disease, emphysema versus ILD given appearance of CT scan at hospitalization.  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  Suspect ILD as primary driver.  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  Discussed role of antifibrotic.  Discussed filling out paperwork to start process today but he left the exam room prior to paperwork being completed.  We will mail paperwork to start Ofev.  We will hold on further work-up and etiology for now as he wishes a second opinion, referral to [facility] today.  Return in about 3 months (around 12/14/2022).   Lanier Clam, MD 09/14/2022  I spent 4 minutes in care of patient reviewing review of records, coordination of care, face-to-face visit.

## 2022-09-14 NOTE — Patient Instructions (Addendum)
Nice to see you again  The CT scan today looks like.  Pneumonia from eosinophils is clearing up nicely.  There was hence of this in the hospital but the CT scan today does confirm at least to my eye signs of scarring or fibrosis in the lung.  We would use medicines to help slow this.  I will plan to start a medicine called Ofev.  The goal of this medicine is to slow down any worsening scarring.  I do not expect to make you necessarily feel better, but help it not get worse or slow the rate of which will get worse.  The scarring or fibrosis is likely why you are still requiring the oxygen despite the pneumonia getting better  I will send a referral for second opinion at [facility].  Return to clinic in 3 months or sooner as needed with Dr. Silas Flood

## 2022-09-18 LAB — ACID FAST CULTURE WITH REFLEXED SENSITIVITIES (MYCOBACTERIA): Acid Fast Culture: NEGATIVE

## 2022-09-26 ENCOUNTER — Encounter: Payer: Self-pay | Admitting: Pulmonary Disease

## 2022-09-27 ENCOUNTER — Telehealth: Payer: Self-pay | Admitting: Pharmacist

## 2022-09-27 NOTE — Telephone Encounter (Signed)
Received notification tha tpatient is a new start to [place]. Submitted a Prior Authorization request to [employer] for OFEV via CoverMyMeds. Will update once we receive a response.  Key: [id number]  Knox Saliva, PharmD, MPH, BCPS, CPP Clinical Pharmacist (Rheumatology and Pulmonology)

## 2022-10-05 ENCOUNTER — Other Ambulatory Visit (HOSPITAL_COMMUNITY): Payer: Self-pay

## 2022-10-05 NOTE — Telephone Encounter (Signed)
Called [employer] to determine status of Ofev PA. Per automated response, PA is in review and should have turnaronud within 72 hours. Since it has been more than 72 hours, I spoke with rep. Completed authorization over the phone. Rep was able to get approved whle on phone. Approval dates: 09/05/2022 through 10/05/2023. We will receive fax of approval. Unable to run test claim as patient is locked into [place]. Based on income documents that he dropped off, he will also not qualify for paitent asistance either. We will submit neverthreless  Case # [id number]  Per automated response on CMM: The request has encountered an error which cannot be resolved. Please contact the plan directly, or contact CoverMyMeds support via chat or by calling [phone].  Phone: [phone]  BI Cares application placed in PAP pending info folder in pharmacy office. Provider portion placed in Dr. Kavin Leech folder for signature.  Patient states he is changing to [place] next year as of 10/10/2022. We will re-run benefits after the new year. Patient and wife are aware. ID: [id number]  BIN: [id number] PCN: IS Group: [id number] RxID: [id number]  Knox Saliva, PharmD, MPH, BCPS, CPP Clinical Pharmacist (Rheumatology and Pulmonology)

## 2022-10-11 NOTE — Telephone Encounter (Signed)
Submitted a Prior Authorization request to  [employer] for OFEV via CoverMyMeds. Will update once we receive a response.  Key: [id number]  Knox Saliva, PharmD, MPH, BCPS, CPP Clinical Pharmacist (Rheumatology and Pulmonology)

## 2022-10-14 ENCOUNTER — Encounter: Payer: Self-pay | Admitting: Pulmonary Disease

## 2022-10-14 NOTE — Telephone Encounter (Signed)
Mychart message sent by pt: Gerald Shelton [facility] (supporting Lanier Clam, MD)29 minutes ago (3:51 PM)    I had previously asked for dr. Silas Flood to prescribe the olev. After some research and advice I think it better that I wait for a followup appt this spring before starting that. Seems it's important to get a little more history on this "little life surprise" to determine if this the type of lung condition is deteriorating or stable. As the insurance has approved the olev it could be started anytime within this year. Thanks Doc!   Routing to Dr. Silas Flood as an Juluis Rainier.

## 2022-10-17 ENCOUNTER — Other Ambulatory Visit (HOSPITAL_COMMUNITY): Payer: Self-pay

## 2022-10-17 NOTE — Telephone Encounter (Signed)
Received notification from [facility] regarding a prior authorization for [place]. Authorization has been APPROVED from 10/10/2022 to 10/11/2023. Approval letter sent to scan center.  Per test claim, copay for 30 days supply is $30. This fill seems to have pushed pt into the donut hole, therefore I am unable to guarantee whether or not the price will change during subsequent fills.  Patient can fill through Kevin (pulmonary fibrosis team). [phone]  Authorization # [id number]  Patient would like to wait on starting Ofev. He is seeing Dr. Silas Flood in the fridge. BI Cares application scanned into patient's chart for retention  Knox Saliva, PharmD, MPH, BCPS, CPP Clinical Pharmacist (Rheumatology and Pulmonology)

## 2022-11-02 ENCOUNTER — Encounter: Payer: Self-pay | Admitting: Family Medicine

## 2022-11-02 ENCOUNTER — Ambulatory Visit (INDEPENDENT_AMBULATORY_CARE_PROVIDER_SITE_OTHER): Payer: Medicare Other | Admitting: Family Medicine

## 2022-11-02 VITALS — BP 118/66 | HR 94 | Temp 97.5°F | Ht 70.0 in | Wt 205.3 lb

## 2022-11-02 DIAGNOSIS — E782 Mixed hyperlipidemia: Secondary | ICD-10-CM | POA: Diagnosis not present

## 2022-11-02 DIAGNOSIS — E538 Deficiency of other specified B group vitamins: Secondary | ICD-10-CM | POA: Diagnosis not present

## 2022-11-02 DIAGNOSIS — R7309 Other abnormal glucose: Secondary | ICD-10-CM | POA: Diagnosis not present

## 2022-11-02 LAB — BASIC METABOLIC PANEL
BUN: 18 mg/dL (ref 6–23)
CO2: 31 mEq/L (ref 19–32)
Calcium: 9 mg/dL (ref 8.4–10.5)
Chloride: 104 mEq/L (ref 96–112)
Creatinine, Ser: 0.9 mg/dL (ref 0.40–1.50)
GFR: 81.1 mL/min (ref >60.00)
Glucose, Bld: 86 mg/dL (ref 70–99)
Potassium: 4.2 mEq/L (ref 3.5–5.1)
Sodium: 143 mEq/L (ref 135–145)

## 2022-11-02 LAB — HEMOGLOBIN A1C: Hgb A1c MFr Bld: 6.3 % (ref 4.6–6.5)

## 2022-11-02 LAB — VITAMIN B12: Vitamin B-12: 359 pg/mL (ref 211–911)

## 2022-11-02 NOTE — Progress Notes (Signed)
Established Patient Office Visit   Subjective:  Patient ID: Gerald Shelton, male    DOB: 11-09-1940  Age: 82 y.o. MRN: [id number]  Chief Complaint  Patient presents with   Follow-up    Follow up no concerns. Patient not fasting.     HPI Encounter Diagnoses  Name Primary?   B12 deficiency Yes   Elevated glucose    For follow-up of B12 deficiency, elevated glucose and hyperlipidemia.  Continues multivitamin and atorvastatin without issue.  No history of diabetes.  ILD is stable with predictable O2 sats at rest with and without oxygen with activity.  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  He is rarely experiencing dyspnea at this point.  Continues follow-up with pulmonology locally and a consultant pulmonologist in [place].   Review of Systems  Constitutional: Negative.   HENT: Negative.    Eyes:  Negative for blurred vision, discharge and redness.  Respiratory: Negative.  Negative for shortness of breath.   Cardiovascular: Negative.   Gastrointestinal:  Negative for abdominal pain.  Genitourinary: Negative.   Musculoskeletal: Negative.  Negative for myalgias.  Skin:  Negative for rash.  Neurological:  Negative for tingling, loss of consciousness and weakness.  Endo/Heme/Allergies:  Negative for polydipsia.     Current Outpatient Medications:    atorvastatin (LIPITOR) 20 MG tablet, TAKE 1 TABLET BY MOUTH EVERY DAY, Disp: 90 tablet, Rfl: 1   augmented betamethasone dipropionate (DIPROLENE-AF) 0.05 % cream, Apply 1 Application topically daily as needed (itching/rash.)., Disp: , Rfl:    Multiple Vitamin (MULTIVITAMIN WITH MINERALS) TABS tablet, Take 1 tablet by mouth in the morning. One A Day, Disp: , Rfl:    Objective:     BP 118/66 (BP Location: Right Arm, Patient Position: Sitting, Cuff Size: Normal)   Pulse 94   Temp (!) 97.5 F (36.4 C) (Temporal)   Ht '5\' 10"'$  (1.778 m)   Wt 205 lb 4.8 oz (93.1 kg)   SpO2 95%    BMI 29.46 kg/m    Physical Exam Constitutional:      General: He is not in acute distress.    Appearance: Normal appearance. He is not ill-appearing, toxic-appearing or diaphoretic.  HENT:     Head: Normocephalic and atraumatic.     Right Ear: External ear normal.     Left Ear: External ear normal.  Eyes:     General: No scleral icterus.       Right eye: No discharge.        Left eye: No discharge.     Extraocular Movements: Extraocular movements intact.     Conjunctiva/sclera: Conjunctivae normal.  Cardiovascular:     Rate and Rhythm: Normal rate and regular rhythm.  Pulmonary:     Effort: Pulmonary effort is normal. No accessory muscle usage or respiratory distress.     Breath sounds: No decreased air movement.     Comments: Faint crackles noted at bases. Skin:    General: Skin is warm and dry.  Neurological:     Mental Status: He is alert and oriented to person, place, and time.  Psychiatric:        Mood and Affect: Mood normal.        Behavior: Behavior normal.      No results found for any visits on 11/02/22.    The 10-year ASCVD risk score (Arnett DK, et al., 2019) is: 26.5%    Assessment & Plan:   B12  deficiency -     Vitamin B12  Elevated glucose -     Basic metabolic panel -     Hemoglobin A1c    Return in about 3 months (around 02/01/2023).    Libby Maw, MD

## 2022-11-09 ENCOUNTER — Telehealth: Payer: Self-pay

## 2022-11-09 DIAGNOSIS — E538 Deficiency of other specified B group vitamins: Secondary | ICD-10-CM

## 2022-11-09 NOTE — Telephone Encounter (Signed)
Spoke with patient went over lab results and recommendations. Per patient he would like Rx for B12 sent to pharmacy. No questions at this time.

## 2022-11-10 MED ORDER — VITAMIN B-12 1000 MCG PO TABS: 1000.0000 ug | ORAL_TABLET | Freq: Every day | ORAL | 1 refills | Status: DC

## 2023-01-31 ENCOUNTER — Ambulatory Visit (INDEPENDENT_AMBULATORY_CARE_PROVIDER_SITE_OTHER): Payer: Medicare Other | Admitting: Family Medicine

## 2023-01-31 ENCOUNTER — Encounter: Payer: Self-pay | Admitting: Family Medicine

## 2023-01-31 VITALS — BP 124/66 | HR 82 | Temp 98.1°F | Ht 70.0 in | Wt 204.3 lb

## 2023-01-31 DIAGNOSIS — R252 Cramp and spasm: Secondary | ICD-10-CM

## 2023-01-31 DIAGNOSIS — E538 Deficiency of other specified B group vitamins: Secondary | ICD-10-CM

## 2023-01-31 DIAGNOSIS — N401 Enlarged prostate with lower urinary tract symptoms: Secondary | ICD-10-CM | POA: Diagnosis not present

## 2023-01-31 DIAGNOSIS — R351 Nocturia: Secondary | ICD-10-CM

## 2023-01-31 DIAGNOSIS — J301 Allergic rhinitis due to pollen: Secondary | ICD-10-CM | POA: Diagnosis not present

## 2023-01-31 LAB — BASIC METABOLIC PANEL
BUN: 17 mg/dL (ref 6–23)
CO2: 31 mEq/L (ref 19–32)
Calcium: 9.3 mg/dL (ref 8.4–10.5)
Chloride: 105 mEq/L (ref 96–112)
Creatinine, Ser: 0.88 mg/dL (ref 0.40–1.50)
GFR: 81.51 mL/min (ref >60.00)
Glucose, Bld: 121 mg/dL — ABNORMAL HIGH (ref 70–99)
Potassium: 4.5 mEq/L (ref 3.5–5.1)
Sodium: 143 mEq/L (ref 135–145)

## 2023-01-31 LAB — URINALYSIS, ROUTINE W REFLEX MICROSCOPIC
Bilirubin Urine: NEGATIVE
Hgb urine dipstick: NEGATIVE
Ketones, ur: NEGATIVE
Leukocytes,Ua: NEGATIVE
Nitrite: NEGATIVE
Specific Gravity, Urine: 1.025 (ref 1.000–1.030)
Total Protein, Urine: NEGATIVE
Urine Glucose: NEGATIVE
Urobilinogen, UA: 0.2 (ref 0.0–1.0)
pH: 6 (ref 5.0–8.0)

## 2023-01-31 LAB — MAGNESIUM: Magnesium: 2 mg/dL (ref 1.5–2.5)

## 2023-01-31 LAB — VITAMIN B12: Vitamin B-12: 897 pg/mL (ref 211–911)

## 2023-01-31 LAB — PSA: PSA: 0.76 ng/mL (ref 0.10–4.00)

## 2023-01-31 MED ORDER — TAMSULOSIN HCL 0.4 MG PO CAPS: 0.4000 mg | ORAL_CAPSULE | Freq: Every day | ORAL | 1 refills | Status: DC

## 2023-01-31 MED ORDER — VITAMIN B-12 1000 MCG PO TABS: 1000.0000 ug | ORAL_TABLET | Freq: Every day | ORAL | 1 refills | Status: DC

## 2023-01-31 MED ORDER — FINASTERIDE 5 MG PO TABS: 5.0000 mg | ORAL_TABLET | Freq: Every day | ORAL | 5 refills | Status: DC

## 2023-01-31 NOTE — Progress Notes (Signed)
Established Patient Office Visit   Subjective:  Patient ID: Gerald Shelton, male    DOB: [birth year]  Age: 82 y.o. MRN: [id number]  Chief Complaint  Patient presents with   Medical Management of Chronic Issues    3 month follow up, discuss seasonal allergies some sinus headaches that come and go. Patient not fasting.    HPI Encounter Diagnoses  Name Primary?   Seasonal allergic rhinitis due to pollen Yes   B12 deficiency    Benign prostatic hyperplasia with nocturia    Nocturnal foot cramps    For follow-up of above.  Ongoing issues with springtime seasonal allergies.  Had discontinued Flonase because he thought it might have been associated with his interstitial lung disease.  He is currently taking the high-dose 1000 mcg Cyano cobalamin tablet.  He has taken B12 injections in the past.  He would prefer to take the tablet if possible ongoing issues with nocturnal foot cramps.  He stretches to help.  Ongoing issues with nocturia x 2-3.  Difficult to hold hydration prior to bedtime because it seems to help his cramps.  Decreased force of stream as well.  Lung function has improved and he is breathing easier.  Continue supplemental oxygen.  O2 sats run around 95.   Review of Systems  Constitutional: Negative.   HENT:  Positive for congestion.   Eyes:  Negative for blurred vision, discharge and redness.  Respiratory: Negative.    Cardiovascular: Negative.   Gastrointestinal:  Negative for abdominal pain.  Genitourinary: Negative.   Musculoskeletal: Negative.  Negative for myalgias.  Skin:  Negative for rash.  Neurological:  Negative for tingling, loss of consciousness and weakness.  Endo/Heme/Allergies:  Negative for polydipsia.     Current Outpatient Medications:    atorvastatin (LIPITOR) 20 MG tablet, TAKE 1 TABLET BY MOUTH EVERY DAY, Disp: 90 tablet, Rfl: 1   augmented betamethasone dipropionate (DIPROLENE-AF) 0.05 % cream, Apply 1 Application topically daily as needed  (itching/rash.)., Disp: , Rfl:    finasteride (PROSCAR) 5 MG tablet, Take 1 tablet (5 mg total) by mouth daily., Disp: 30 tablet, Rfl: 5   Multiple Vitamin (MULTIVITAMIN WITH MINERALS) TABS tablet, Take 1 tablet by mouth in the morning. One A Day, Disp: , Rfl:    tamsulosin (FLOMAX) 0.4 MG CAPS capsule, Take 1 capsule (0.4 mg total) by mouth daily., Disp: 90 capsule, Rfl: 1   cyanocobalamin (VITAMIN B12) 1000 MCG tablet, Take 1 tablet (1,000 mcg total) by mouth daily., Disp: 90 tablet, Rfl: 1   Objective:     BP 124/66 (BP Location: Right Arm, Patient Position: Sitting, Cuff Size: Normal)   Pulse 82   Temp 98.1 F (36.7 C) (Temporal)   Ht  (1.778 m)   Wt 204 lb 4.8 oz (92.7 kg)   SpO2 94%   BMI 29.31 kg/m  Wt Readings from Last 3 Encounters:  01/31/23 204 lb 4.8 oz (92.7 kg)  11/02/22 205 lb 4.8 oz (93.1 kg)  09/14/22 198 lb (89.8 kg)      Physical Exam Constitutional:      General: He is not in acute distress.    Appearance: Normal appearance. He is not ill-appearing, toxic-appearing or diaphoretic.  HENT:     Head: Normocephalic and atraumatic.     Right Ear: External ear normal.     Left Ear: External ear normal.  Eyes:     General: No scleral icterus.       Right eye: No discharge.  Left eye: No discharge.     Extraocular Movements: Extraocular movements intact.     Conjunctiva/sclera: Conjunctivae normal.  Pulmonary:     Effort: Pulmonary effort is normal. No respiratory distress.  Skin:    General: Skin is warm and dry.  Neurological:     Mental Status: He is alert and oriented to person, place, and time.  Psychiatric:        Mood and Affect: Mood normal.        Behavior: Behavior normal.      No results found for any visits on 01/31/23.    The 10-year ASCVD risk score (Arnett DK, et al., 2019) is: 28.5%    Assessment & Plan:   Seasonal allergic rhinitis due to pollen -     Vitamin B12  B12 deficiency -     Vitamin B-12; Take 1 tablet  (1,000 mcg total) by mouth daily.  Dispense: 90 tablet; Refill: 1  Benign prostatic hyperplasia with nocturia -     PSA -     Urinalysis, Routine w reflex microscopic -     Tamsulosin HCl; Take 1 capsule (0.4 mg total) by mouth daily.  Dispense: 90 capsule; Refill: 1 -     Finasteride; Take 1 tablet (5 mg total) by mouth daily.  Dispense: 30 tablet; Refill: 5  Nocturnal foot cramps -     Basic metabolic panel -     Magnesium    Return in about 3 months (around 05/02/2023).  Information was given on BPH.  Information was also given on Proscar and Flomax.  Advised that Proscar will take time to work.  Will use Flomax in the meantime.  Discussed possible side effects.  Discussed monitoring his PSA.  PSA measured 7 years ago was less than 1.  Would prefer to take the B12 1000 mcg daily in lieu monthly injection.  Mliss Sax, MD

## 2023-02-19 ENCOUNTER — Other Ambulatory Visit: Payer: Self-pay | Admitting: Family Medicine

## 2023-02-19 DIAGNOSIS — E782 Mixed hyperlipidemia: Secondary | ICD-10-CM

## 2023-02-20 ENCOUNTER — Telehealth: Payer: Self-pay | Admitting: Family Medicine

## 2023-02-20 NOTE — Telephone Encounter (Signed)
Contacted [name] to schedule their annual wellness visit. Appointment made for 02/24/23.  Rudell Cobb AWV direct phone # [phone]

## 2023-02-24 ENCOUNTER — Ambulatory Visit (INDEPENDENT_AMBULATORY_CARE_PROVIDER_SITE_OTHER): Payer: Medicare Other

## 2023-02-24 VITALS — Ht 70.0 in | Wt 201.0 lb

## 2023-02-24 DIAGNOSIS — Z0000 Encounter for general adult medical examination without abnormal findings: Secondary | ICD-10-CM

## 2023-02-24 NOTE — Progress Notes (Signed)
I connected with  Gerald Shelton on 02/24/23 by a audio enabled telemedicine application and verified that I am speaking with the correct person using two identifiers.  Patient Location: Home  Provider Location: Office/Clinic  I discussed the limitations of evaluation and management by telemedicine. The patient expressed understanding and agreed to proceed.  Subjective:   Gerald Shelton is a 82 y.o. male who presents for Medicare Annual/Subsequent preventive examination.  Patient Medicare AWV questionnaire was completed by the patient on 02/20/2023; I have confirmed that all information answered by patient is correct and no changes since this date.     Review of Systems     Cardiac Risk Factors include: advanced age (>106men, >46 women);dyslipidemia;male gender     Objective:    Today's Vitals   02/24/23 1458  Weight: 201 lb (91.2 kg)  Height: 5\' 10"  (1.778 m)   Body mass index is 28.84 kg/m.     02/24/2023    3:03 PM 07/28/2022   10:00 AM 07/03/2022   10:40 AM 05/06/2020    2:19 PM 10/17/2018   10:18 AM  Advanced Directives  Does Patient Have a Medical Advance Directive? Yes Yes No Yes Yes  Type of Estate agent of [place];Living will Healthcare Power of [place];Living will  Healthcare Power of [place];Living will Healthcare Power of [place];Living will  Does patient want to make changes to medical advance directive?     No - Patient declined  Copy of Healthcare Power of Attorney in Chart? No - copy requested No - copy requested  No - copy requested No - copy requested  Would patient like information on creating a medical advance directive?   No - Patient declined      Current Medications (verified) Outpatient Encounter Medications as of 02/24/2023  Medication Sig   atorvastatin (LIPITOR) 20 MG tablet TAKE 1 TABLET BY MOUTH EVERY DAY   augmented betamethasone dipropionate (DIPROLENE-AF) 0.05 % cream Apply 1 Application topically daily as needed  (itching/rash.).   finasteride (PROSCAR) 5 MG tablet Take 1 tablet (5 mg total) by mouth daily.   Multiple Vitamin (MULTIVITAMIN WITH MINERALS) TABS tablet Take 1 tablet by mouth in the morning. One A Day   tamsulosin (FLOMAX) 0.4 MG CAPS capsule Take 1 capsule (0.4 mg total) by mouth daily.   cyanocobalamin (VITAMIN B12) 1000 MCG tablet Take 1 tablet (1,000 mcg total) by mouth daily.   No facility-administered encounter medications on file as of 02/24/2023.    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   Past Surgical History:  Procedure Laterality Date   BRONCHIAL WASHINGS  07/28/2022   Procedure: BRONCHIAL WASHINGS;  Surgeon: Karren Burly, MD;  Location: [facility];  Service: Endoscopy;;   EYE SURGERY     cataract removed form right eye   HERNIA REPAIR     x2   NASAL SEPTUM SURGERY     VIDEO BRONCHOSCOPY N/A 07/28/2022   Procedure: VIDEO BRONCHOSCOPY WITHOUT FLUORO;  Surgeon: Hunsucker,  Lesia Sago, MD;  Location: [facility];  Service: Endoscopy;  Laterality: N/A;   Family History  Problem Relation Age of Onset   Diabetes Son    Parkinson's disease Mother    Depression Mother    Dementia Mother    Social History   Socioeconomic History   Marital status: Married    Spouse name: Not on file   Number of children: 6   Years of education: Not on file   Highest education level: Some college, no degree  Occupational History   Occupation: Retired  Tobacco Use   Smoking status: Former    Types: Cigarettes     Quit date: 10/25/1998    Years since quitting: 24.3   Smokeless tobacco: Never  Vaping Use   Vaping Use: Never used  Substance and Sexual Activity   Alcohol use: Yes    Comment: RARE   Drug use: No   Sexual activity: Not Currently  Other Topics Concern   Not on file  Social History Narrative   Not on file   Social Determinants of Health   Financial Resource Strain: Low Risk  (02/20/2023)   Overall Financial Resource Strain (CARDIA)    Difficulty of Paying Living Expenses: Not hard at all  Food Insecurity: No Food Insecurity (01/27/2023)   Hunger Vital Sign    Worried About Running Out of Food in the Last Year: Never true    Ran Out of Food in the Last Year: Never true  Transportation Needs: No Transportation Needs (02/20/2023)   PRAPARE - Administrator, Civil Service (Medical): No    Lack of Transportation (Non-Medical): No  Physical Activity: Insufficiently Active (02/20/2023)   Exercise Vital Sign    Days of Exercise per Week: 1 day    Minutes of Exercise per Session: 20 min  Stress: No Stress Concern Present (02/20/2023)   [employer] of Occupational Health - Occupational Stress Questionnaire    Feeling of Stress : Not at all  Social Connections: Moderately Isolated (02/20/2023)   Social Connection and Isolation Panel [NHANES]    Frequency of Communication with Friends and Family: Never    Frequency of Social Gatherings with Friends and Family: Once a week    Attends Religious Services: Never    Database administrator or Organizations: Yes    Attends Engineer, structural: More than 4 times per year    Marital Status: Married    Tobacco Counseling Counseling given: Not Answered   Clinical Intake:  Pre-visit preparation completed: Yes  Pain : No/denies pain     Nutritional Status: BMI 25 -29 Overweight Nutritional Risks: None Diabetes: No  How often do you need to have someone help you when you read instructions, pamphlets, or other  written materials from your doctor or pharmacy?: 1 - Never  Diabetic? no  Interpreter Needed?: No  Information entered by :: NAllen LPN   Activities of Daily Living    02/20/2023    4:29 PM 08/17/2022    5:25 PM  In your present state of health, do you have any difficulty performing the following activities:  Hearing? 1 1  Comment has hearing aids   Vision? 0 0  Difficulty concentrating or making decisions? 0 0  Walking or climbing stairs? 0 0  Dressing or bathing? 0 0  Doing errands, shopping? 0 0  Preparing Food and eating ? N N  Using the Toilet? N N  In the past six months, have you accidently leaked  urine? N N  Do you have problems with loss of bowel control? N N  Managing your Medications? N N  Managing your Finances? N N  Housekeeping or managing your Housekeeping? N N    Patient Care Team: Mliss Sax, MD as PCP - General (Family Medicine) Regan Lemming, MD as PCP - Electrophysiology (Cardiology) Napoleon Form, MD as Consulting Physician (Gastroenterology) Larey Dresser, DPM as Consulting Physician (Podiatry) Irving Burton, DMD (Dentistry)  Indicate any recent Medical Services you may have received from other than [facility] providers in the past year (date may be approximate).     Assessment:   This is a routine wellness examination for [place].  Hearing/Vision screen Vision Screening - Comments:: Regular eye exams, [facility]  Dietary issues and exercise activities discussed: Current Exercise Habits: Home exercise routine, Type of exercise: walking, Time (Minutes): 20, Frequency (Times/Week): 1, Weekly Exercise (Minutes/Week): 20   Goals Addressed             This Visit's Progress    Patient Stated       02/24/2023, wants to increase exercise to twice weekly       Depression Screen    02/24/2023    3:03 PM 01/31/2023   10:06 AM 11/02/2022   10:16 AM 08/03/2022    9:57 AM 07/18/2022    2:20 PM 07/11/2022    3:59 PM  07/05/2022   10:46 AM  PHQ 2/9 Scores  PHQ - 2 Score 0 0 0 0 0 0 0    Fall Risk    02/20/2023    4:29 PM 01/31/2023   10:06 AM 11/02/2022   10:16 AM 08/17/2022    5:25 PM 08/03/2022    9:57 AM  Fall Risk   Falls in the past year? 0 0 0 0 0  Number falls in past yr: 0 0 0  0  Injury with Fall? 0 0 0    Risk for fall due to : Medication side effect No Fall Risks No Fall Risks    Follow up Falls prevention discussed;Education provided;Falls evaluation completed Falls evaluation completed Falls evaluation completed      FALL RISK PREVENTION PERTAINING TO THE HOME:  Any stairs in or around the home? Yes  If so, are there any without handrails? No  Home free of loose throw rugs in walkways, pet beds, electrical cords, etc? Yes  Adequate lighting in your home to reduce risk of falls? Yes   ASSISTIVE DEVICES UTILIZED TO PREVENT FALLS:  Life alert? No  Use of a cane, walker or w/c? No  Grab bars in the bathroom? No  Shower chair or bench in shower? Yes  Elevated toilet seat or a handicapped toilet? No   TIMED UP AND GO:  Was the test performed? No .      Cognitive Function:        02/24/2023    3:04 PM 08/18/2022    3:36 PM 05/06/2020    2:30 PM  6CIT Screen  What Year? 0 points 0 points 0 points  What month? 0 points 0 points 0 points  What time? 0 points 0 points 0 points  Count back from 20 0 points 0 points 0 points  Months in reverse 0 points 0 points 0 points  Repeat phrase 0 points 0 points 0 points  Total Score 0 points 0 points 0 points    Immunizations Immunization History  Administered Date(s) Administered   Influenza, High Dose Seasonal PF  08/24/2018, 08/04/2019, 07/18/2022   Influenza-Unspecified 08/06/2021   Moderna SARS-COV2 Booster Vaccination 02/09/2021, 07/21/2021   Moderna Sars-Covid-2 Vaccination 10/29/2019, 11/27/2019, 08/09/2020, 02/09/2021   Pneumococcal Conjugate-13 11/10/2014   Pneumococcal Polysaccharide-23 10/17/2018   Respiratory  Syncytial Virus Vaccine,Recomb Aduvanted(Arexvy) 09/21/2022   Tdap 01/31/2014   Zoster Recombinat (Shingrix) 07/15/2019, 10/10/2019    TDAP status: Up to date  Flu Vaccine status: Up to date  Pneumococcal vaccine status: Up to date  Covid-19 vaccine status: Completed vaccines  Qualifies for Shingles Vaccine? Yes   Zostavax completed Yes   Shingrix Completed?: Yes  Screening Tests Health Maintenance  Topic Date Due   Medicare Annual Wellness (AWV)  05/06/2021   INFLUENZA VACCINE  05/11/2023   DTaP/Tdap/Td (2 - Td or Tdap) 02/01/2024   Pneumonia Vaccine 37+ Years old  Completed   HPV VACCINES  Aged Out   COVID-19 Vaccine  Discontinued   Zoster Vaccines- Shingrix  Discontinued    Health Maintenance  Health Maintenance Due  Topic Date Due   Medicare Annual Wellness (AWV)  05/06/2021    Colorectal cancer screening: No longer required.   Lung Cancer Screening: (Low Dose CT Chest recommended if Age 20-80 years, 30 pack-year currently smoking OR have quit w/in 15years.) does not qualify.   Lung Cancer Screening Referral: no  Additional Screening:  Hepatitis C Screening: does not qualify;   Vision Screening: Recommended annual ophthalmology exams for early detection of glaucoma and other disorders of the eye. Is the patient up to date with their annual eye exam?  Yes  Who is the provider or what is the name of the office in which the patient attends annual eye exams? [facility] If pt is not established with a provider, would they like to be referred to a provider to establish care? No .   Dental Screening: Recommended annual dental exams for proper oral hygiene  Community Resource Referral / Chronic Care Management: CRR required this visit?  No   CCM required this visit?  No      Plan:     I have personally reviewed and noted the following in the patient's chart:   Medical and social history Use of alcohol, tobacco or illicit drugs  Current medications and  supplements including opioid prescriptions. Patient is not currently taking opioid prescriptions. Functional ability and status Nutritional status Physical activity Advanced directives List of other physicians Hospitalizations, surgeries, and ER visits in previous 12 months Vitals Screenings to include cognitive, depression, and falls Referrals and appointments  In addition, I have reviewed and discussed with patient certain preventive protocols, quality metrics, and best practice recommendations. A written personalized care plan for preventive services as well as general preventive health recommendations were provided to patient.     Barb Merino, LPN   1/61/0960   Nurse Notes: none  Due to this being a virtual visit, the after visit summary with patients personalized plan was offered to patient via mail or my-chart. Patient would like to access on my-chart

## 2023-02-24 NOTE — Patient Instructions (Signed)
Gerald Shelton , Thank you for taking time to come for your Medicare Wellness Visit. I appreciate your ongoing commitment to your health goals. Please review the following plan we discussed and let me know if I can assist you in the future.   These are the goals we discussed:  Goals      DIET - EAT MORE FRUITS AND VEGETABLES     Increase physical activity     Continue going to the gym 3x per week     Patient Stated     02/24/2023, wants to increase exercise to twice weekly        This is a list of the screening recommended for you and due dates:  Health Maintenance  Topic Date Due   Flu Shot  05/11/2023   DTaP/Tdap/Td vaccine (2 - Td or Tdap) 02/01/2024   Medicare Annual Wellness Visit  02/24/2024   Pneumonia Vaccine  Completed   HPV Vaccine  Aged Out   COVID-19 Vaccine  Discontinued   Zoster (Shingles) Vaccine  Discontinued    Advanced directives: Please bring a copy of your POA (Power of [place]) and/or Living Will to your next appointment.   Conditions/risks identified: none  Next appointment: Follow up in one year for your annual wellness visit.   Preventive Care 14 Years and Older, Male  Preventive care refers to lifestyle choices and visits with your health care provider that can promote health and wellness. What does preventive care include? A yearly physical exam. This is also called an annual well check. Dental exams once or twice a year. Routine eye exams. Ask your health care provider how often you should have your eyes checked. Personal lifestyle choices, including: Daily care of your teeth and gums. Regular physical activity. Eating a healthy diet. Avoiding tobacco and drug use. Limiting alcohol use. Practicing safe sex. Taking low doses of aspirin every day. Taking vitamin and mineral supplements as recommended by your health care provider. What happens during an annual well check? The services and screenings done by your health care provider during your  annual well check will depend on your age, overall health, lifestyle risk factors, and family history of disease. Counseling  Your health care provider may ask you questions about your: Alcohol use. Tobacco use. Drug use. Emotional well-being. Home and relationship well-being. Sexual activity. Eating habits. History of falls. Memory and ability to understand (cognition). Work and work Astronomer. Screening  You may have the following tests or measurements: Height, weight, and BMI. Blood pressure. Lipid and cholesterol levels. These may be checked every 5 years, or more frequently if you are over 59 years old. Skin check. Lung cancer screening. You may have this screening every year starting at age 42 if you have a 30-pack-year history of smoking and currently smoke or have quit within the past 15 years. Fecal occult blood test (FOBT) of the stool. You may have this test every year starting at age 36. Flexible sigmoidoscopy or colonoscopy. You may have a sigmoidoscopy every 5 years or a colonoscopy every 10 years starting at age 61. Prostate cancer screening. Recommendations will vary depending on your family history and other risks. Hepatitis C blood test. Hepatitis B blood test. Sexually transmitted disease (STD) testing. Diabetes screening. This is done by checking your blood sugar (glucose) after you have not eaten for a while (fasting). You may have this done every 1-3 years. Abdominal aortic aneurysm (AAA) screening. You may need this if you are a current or former smoker.  Osteoporosis. You may be screened starting at age 79 if you are at high risk. Talk with your health care provider about your test results, treatment options, and if necessary, the need for more tests. Vaccines  Your health care provider may recommend certain vaccines, such as: Influenza vaccine. This is recommended every year. Tetanus, diphtheria, and acellular pertussis (Tdap, Td) vaccine. You may need a Td  booster every 10 years. Zoster vaccine. You may need this after age 23. Pneumococcal 13-valent conjugate (PCV13) vaccine. One dose is recommended after age 48. Pneumococcal polysaccharide (PPSV23) vaccine. One dose is recommended after age 43. Talk to your health care provider about which screenings and vaccines you need and how often you need them. This information is not intended to replace advice given to you by your health care provider. Make sure you discuss any questions you have with your health care provider. Document Released: 10/23/2015 Document Revised: 06/15/2016 Document Reviewed: 07/28/2015 Elsevier Interactive Patient Education  2017 [employer].  Fall Prevention in the Home Falls can cause injuries. They can happen to people of all ages. There are many things you can do to make your home safe and to help prevent falls. What can I do on the outside of my home? Regularly fix the edges of walkways and driveways and fix any cracks. Remove anything that might make you trip as you walk through a door, such as a raised step or threshold. Trim any bushes or trees on the path to your home. Use bright outdoor lighting. Clear any walking paths of anything that might make someone trip, such as rocks or tools. Regularly check to see if handrails are loose or broken. Make sure that both sides of any steps have handrails. Any raised decks and porches should have guardrails on the edges. Have any leaves, snow, or ice cleared regularly. Use sand or salt on walking paths during winter. Clean up any spills in your garage right away. This includes oil or grease spills. What can I do in the bathroom? Use night lights. Install grab bars by the toilet and in the tub and shower. Do not use towel bars as grab bars. Use non-skid mats or decals in the tub or shower. If you need to sit down in the shower, use a plastic, non-slip stool. Keep the floor dry. Clean up any water that spills on the floor  as soon as it happens. Remove soap buildup in the tub or shower regularly. Attach bath mats securely with double-sided non-slip rug tape. Do not have throw rugs and other things on the floor that can make you trip. What can I do in the bedroom? Use night lights. Make sure that you have a light by your bed that is easy to reach. Do not use any sheets or blankets that are too big for your bed. They should not hang down onto the floor. Have a firm chair that has side arms. You can use this for support while you get dressed. Do not have throw rugs and other things on the floor that can make you trip. What can I do in the kitchen? Clean up any spills right away. Avoid walking on wet floors. Keep items that you use a lot in easy-to-reach places. If you need to reach something above you, use a strong step stool that has a grab bar. Keep electrical cords out of the way. Do not use floor polish or wax that makes floors slippery. If you must use wax, use non-skid floor  wax. Do not have throw rugs and other things on the floor that can make you trip. What can I do with my stairs? Do not leave any items on the stairs. Make sure that there are handrails on both sides of the stairs and use them. Fix handrails that are broken or loose. Make sure that handrails are as long as the stairways. Check any carpeting to make sure that it is firmly attached to the stairs. Fix any carpet that is loose or worn. Avoid having throw rugs at the top or bottom of the stairs. If you do have throw rugs, attach them to the floor with carpet tape. Make sure that you have a light switch at the top of the stairs and the bottom of the stairs. If you do not have them, ask someone to add them for you. What else can I do to help prevent falls? Wear shoes that: Do not have high heels. Have rubber bottoms. Are comfortable and fit you well. Are closed at the toe. Do not wear sandals. If you use a stepladder: Make sure that it is  fully opened. Do not climb a closed stepladder. Make sure that both sides of the stepladder are locked into place. Ask someone to hold it for you, if possible. Clearly mark and make sure that you can see: Any grab bars or handrails. First and last steps. Where the edge of each step is. Use tools that help you move around (mobility aids) if they are needed. These include: Canes. Walkers. Scooters. Crutches. Turn on the lights when you go into a dark area. Replace any light bulbs as soon as they burn out. Set up your furniture so you have a clear path. Avoid moving your furniture around. If any of your floors are uneven, fix them. If there are any pets around you, be aware of where they are. Review your medicines with your doctor. Some medicines can make you feel dizzy. This can increase your chance of falling. Ask your doctor what other things that you can do to help prevent falls. This information is not intended to replace advice given to you by your health care provider. Make sure you discuss any questions you have with your health care provider. Document Released: 07/23/2009 Document Revised: 03/03/2016 Document Reviewed: 10/31/2014 Elsevier Interactive Patient Education  2017 [employer].

## 2023-03-14 IMAGING — CR DG CHEST 2V
2 series · 2 of 2 positions shown · non-contrast
Comparison: October 18, 2016.

CLINICAL DATA: Two weeks of cough with rales on examination in a
80-year-old male.

EXAM:
CHEST - 2 VIEW

[chest pa]
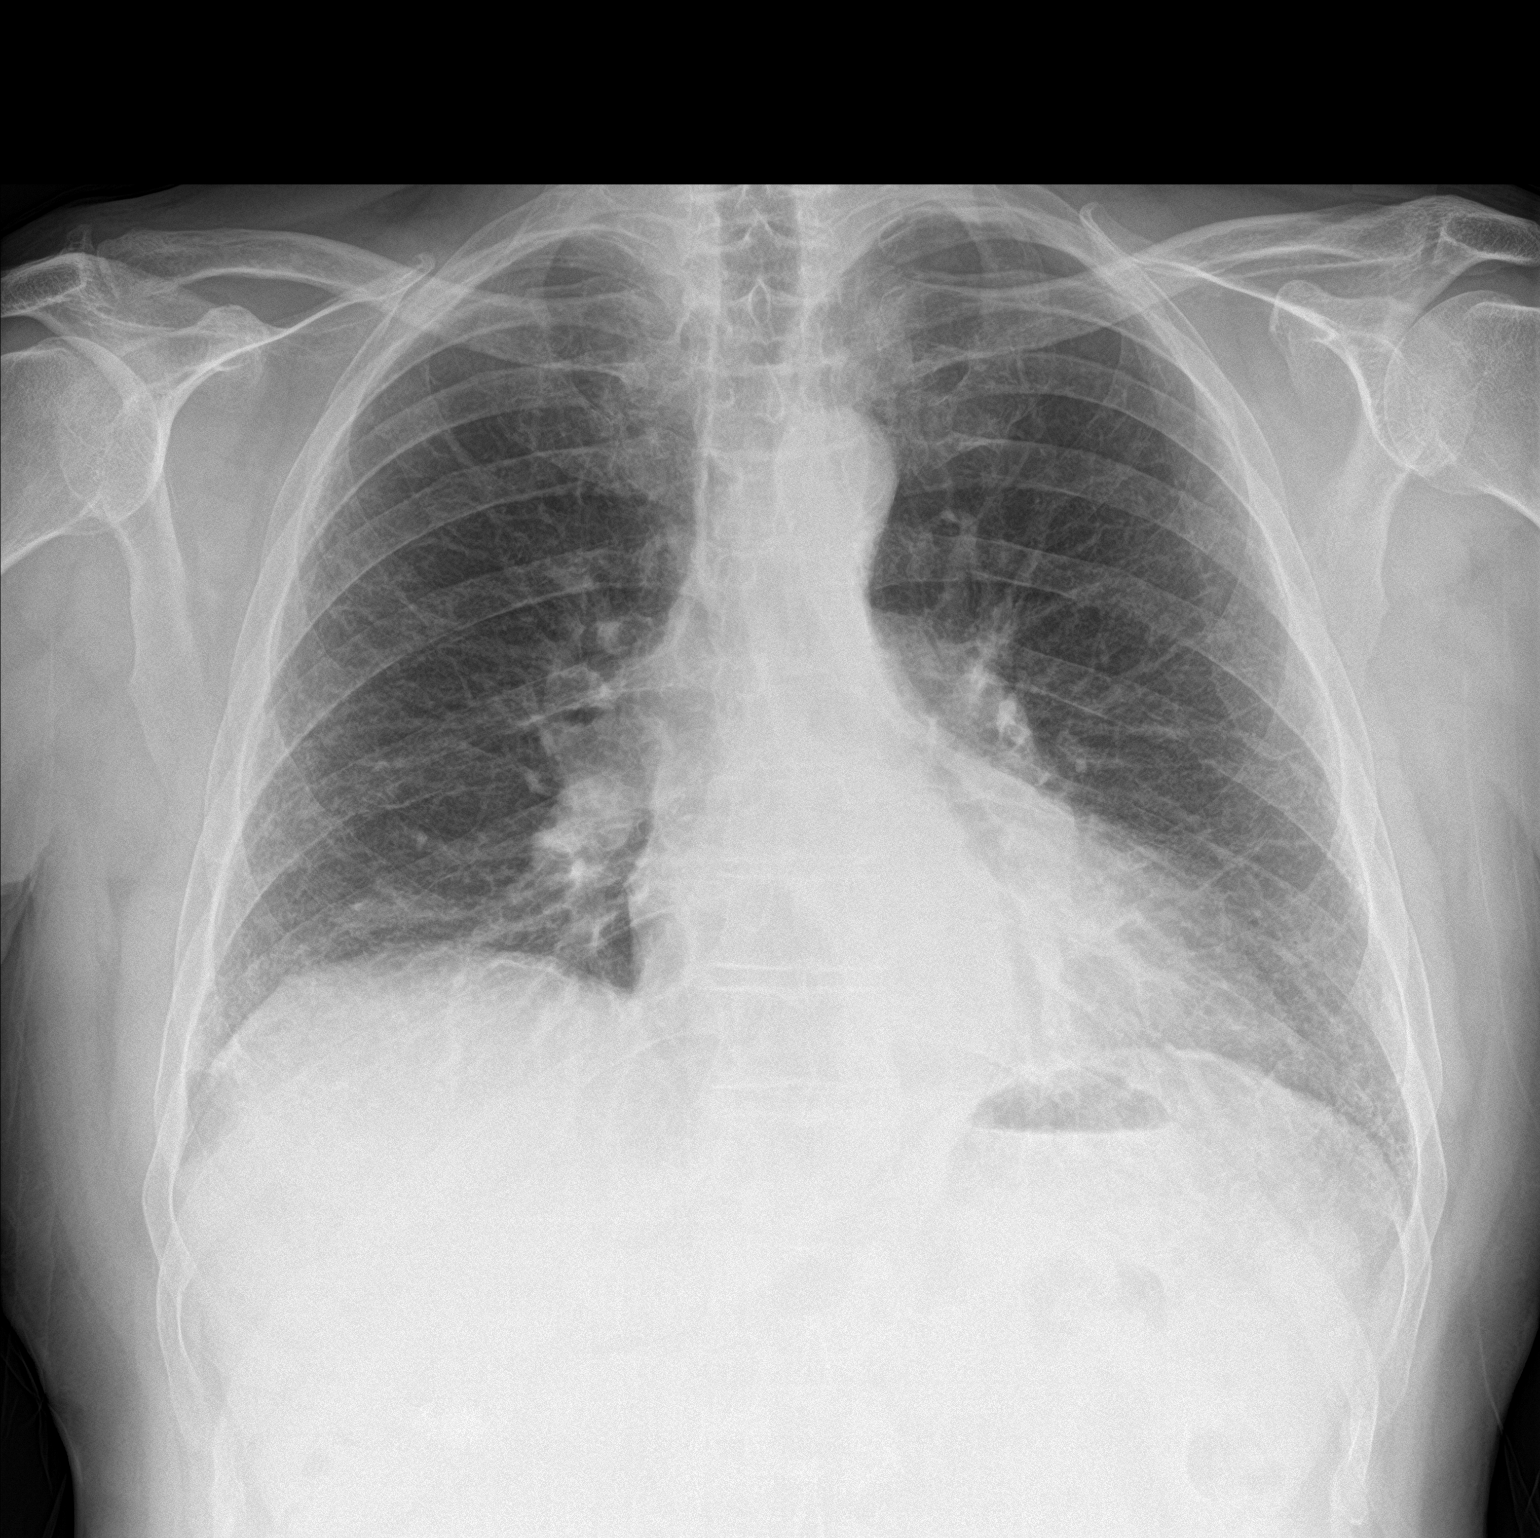

[chest lat]
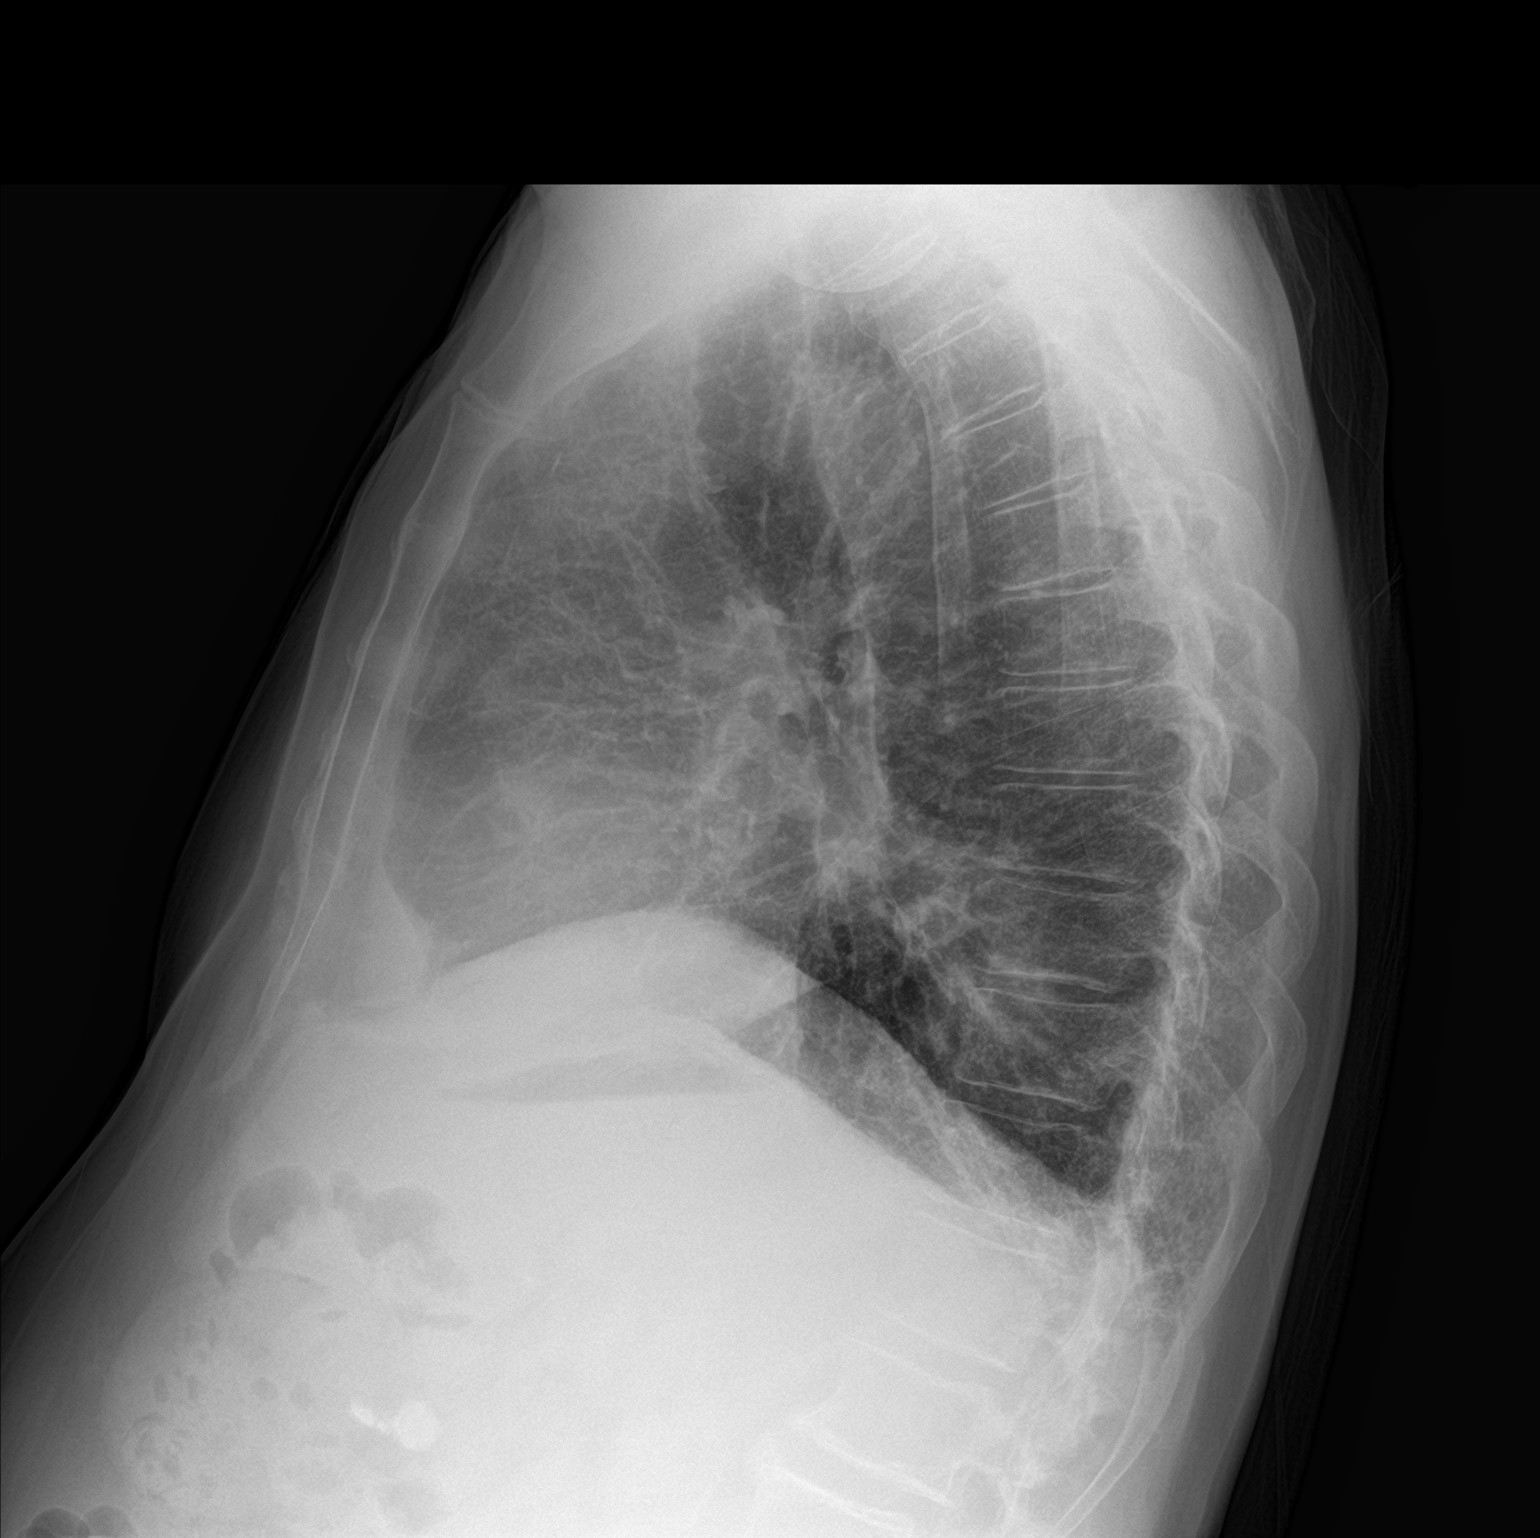

[2 of 2 positions shown; findings below may reference images not displayed]

FINDINGS: Trachea midline. Cardiomediastinal contours and hilar structures are
normal. No lobar consolidation.

Slight added density over the spine on the lateral view. May
correspond to the slight added density in the retrocardiac region on
the frontal view. No visible pneumothorax. No sign of pleural
effusion.

Osteopenia without acute skeletal process on limited assessment.
IMPRESSION: Question developing airspace disease in the LEFT lung base, findings
may represent early pneumonia.

## 2023-04-25 ENCOUNTER — Ambulatory Visit: Payer: Medicare Other | Admitting: Pulmonary Disease

## 2023-05-02 ENCOUNTER — Ambulatory Visit: Payer: Medicare Other | Admitting: Family Medicine

## 2023-05-02 ENCOUNTER — Telehealth: Payer: Self-pay | Admitting: Pulmonary Disease

## 2023-05-02 NOTE — Telephone Encounter (Signed)
Called Pt and pt stated he doesn't need to be seen

## 2023-05-04 ENCOUNTER — Encounter: Payer: Self-pay | Admitting: Family Medicine

## 2023-05-04 ENCOUNTER — Ambulatory Visit: Payer: Medicare Other | Admitting: Family Medicine

## 2023-05-04 VITALS — BP 134/70 | HR 72 | Temp 98.0°F | Ht 70.0 in | Wt 211.2 lb

## 2023-05-04 DIAGNOSIS — R519 Headache, unspecified: Secondary | ICD-10-CM | POA: Diagnosis not present

## 2023-05-04 DIAGNOSIS — N401 Enlarged prostate with lower urinary tract symptoms: Secondary | ICD-10-CM

## 2023-05-04 DIAGNOSIS — R7303 Prediabetes: Secondary | ICD-10-CM | POA: Diagnosis not present

## 2023-05-04 DIAGNOSIS — E782 Mixed hyperlipidemia: Secondary | ICD-10-CM

## 2023-05-04 DIAGNOSIS — J301 Allergic rhinitis due to pollen: Secondary | ICD-10-CM

## 2023-05-04 DIAGNOSIS — E538 Deficiency of other specified B group vitamins: Secondary | ICD-10-CM | POA: Diagnosis not present

## 2023-05-04 DIAGNOSIS — R351 Nocturia: Secondary | ICD-10-CM | POA: Diagnosis not present

## 2023-05-04 LAB — VITAMIN B12: Vitamin B-12: 498 pg/mL (ref 211–911)

## 2023-05-04 LAB — BASIC METABOLIC PANEL
BUN: 18 mg/dL (ref 6–23)
CO2: 31 mEq/L (ref 19–32)
Calcium: 9.1 mg/dL (ref 8.4–10.5)
Chloride: 104 mEq/L (ref 96–112)
Creatinine, Ser: 0.92 mg/dL (ref 0.40–1.50)
GFR: 78.71 mL/min (ref >60.00)
Glucose, Bld: 128 mg/dL — ABNORMAL HIGH (ref 70–99)
Potassium: 4.2 mEq/L (ref 3.5–5.1)
Sodium: 142 mEq/L (ref 135–145)

## 2023-05-04 LAB — LDL CHOLESTEROL, DIRECT: Direct LDL: 56 mg/dL

## 2023-05-04 LAB — HEMOGLOBIN A1C: Hgb A1c MFr Bld: 6.2 % (ref 4.6–6.5)

## 2023-05-04 LAB — PSA: PSA: 0.45 ng/mL (ref 0.10–4.00)

## 2023-05-04 MED ORDER — CETIRIZINE HCL 10 MG PO TABS: 10.0000 mg | ORAL_TABLET | Freq: Every day | ORAL | 11 refills | Status: DC

## 2023-05-04 NOTE — Progress Notes (Addendum)
Established Patient Office Visit   Subjective:  Patient ID: Gerald Shelton, male    DOB: March 22, 1941  Age: 82 y.o. MRN: [id number]  Chief Complaint  Patient presents with   Medical Management of Chronic Issues    3 month follow up. Pt states the sinus headache is inly on the left side and in constant.     HPI Encounter Diagnoses  Name Primary?   B12 deficiency Yes   Prediabetes    Mixed hyperlipidemia    Frontal headache    Seasonal allergic rhinitis due to pollen    Benign prostatic hyperplasia with nocturia    Allergy symptoms with watery eyes coughing, sneezing and postnasal drip persist.  Not sure the Flonase has helped much.  He has been dealing with a left frontal headache when he coughs or sneezes.  Continues a B12 supplement.  Finasteride has improved his urine flow.  Continues atorvastatin for elevated cholesterol.  No family history of diabetes   Review of Systems  Constitutional: Negative.   HENT: Negative.    Eyes:  Negative for blurred vision, discharge and redness.  Respiratory:  Positive for cough. Negative for sputum production, shortness of breath and wheezing.   Cardiovascular: Negative.   Gastrointestinal:  Negative for abdominal pain.  Genitourinary: Negative.   Musculoskeletal: Negative.  Negative for myalgias.  Skin:  Negative for rash.  Neurological:  Negative for tingling, loss of consciousness and weakness.  Endo/Heme/Allergies:  Negative for polydipsia.     Current Outpatient Medications:    atorvastatin (LIPITOR) 20 MG tablet, TAKE 1 TABLET BY MOUTH EVERY DAY, Disp: 90 tablet, Rfl: 1   augmented betamethasone dipropionate (DIPROLENE-AF) 0.05 % cream, Apply 1 Application topically daily as needed (itching/rash.)., Disp: , Rfl:    cetirizine (ZYRTEC) 10 MG tablet, Take 1 tablet (10 mg total) by mouth at bedtime., Disp: 30 tablet, Rfl: 11   cyanocobalamin (VITAMIN B12) 1000 MCG tablet, Take 1 tablet (1,000 mcg total) by mouth daily., Disp: 90 tablet,  Rfl: 2   finasteride (PROSCAR) 5 MG tablet, Take 1 tablet (5 mg total) by mouth daily., Disp: 30 tablet, Rfl: 5   Multiple Vitamin (MULTIVITAMIN WITH MINERALS) TABS tablet, Take 1 tablet by mouth in the morning. One A Day, Disp: , Rfl:    tamsulosin (FLOMAX) 0.4 MG CAPS capsule, Take 1 capsule (0.4 mg total) by mouth daily., Disp: 90 capsule, Rfl: 1   Objective:     BP 134/70 (BP Location: Right Arm, Patient Position: Sitting, Cuff Size: Large)   Pulse 72   Temp 98 F (36.7 C)   Ht 5\' 10"  (1.778 m)   Wt 211 lb 3.2 oz (95.8 kg)   SpO2 94%   BMI 30.30 kg/m    Physical Exam Constitutional:      General: He is not in acute distress.    Appearance: Normal appearance. He is not ill-appearing, toxic-appearing or diaphoretic.  HENT:     Head: Normocephalic and atraumatic.     Right Ear: Tympanic membrane, ear canal and external ear normal.     Left Ear: Tympanic membrane, ear canal and external ear normal.     Mouth/Throat:     Mouth: Mucous membranes are moist.     Pharynx: Oropharynx is clear. No oropharyngeal exudate or posterior oropharyngeal erythema.  Eyes:     General: No scleral icterus.       Right eye: No discharge.        Left eye: No discharge.     Extraocular  Movements: Extraocular movements intact.     Conjunctiva/sclera: Conjunctivae normal.     Pupils: Pupils are equal, round, and reactive to light.  Cardiovascular:     Rate and Rhythm: Normal rate and regular rhythm.  Pulmonary:     Effort: Pulmonary effort is normal. No respiratory distress.     Breath sounds: Normal breath sounds. No wheezing, rhonchi or rales.  Musculoskeletal:     Cervical back: No rigidity or tenderness.  Skin:    General: Skin is warm and dry.  Neurological:     Mental Status: He is alert and oriented to person, place, and time.     Cranial Nerves: No cranial nerve deficit, dysarthria or facial asymmetry.  Psychiatric:        Mood and Affect: Mood normal.        Behavior: Behavior  normal.      Results for orders placed or performed in visit on 05/04/23  Basic metabolic panel  Result Value Ref Range   Sodium 142 135 - 145 mEq/L   Potassium 4.2 3.5 - 5.1 mEq/L   Chloride 104 96 - 112 mEq/L   CO2 31 19 - 32 mEq/L   Glucose, Bld 128 (H) 70 - 99 mg/dL   BUN 18 6 - 23 mg/dL   Creatinine, Ser 1.61 0.40 - 1.50 mg/dL   GFR 09.60 >45.40 mL/min   Calcium 9.1 8.4 - 10.5 mg/dL  LDL cholesterol, direct  Result Value Ref Range   Direct LDL 56.0 mg/dL  Hemoglobin J8J  Result Value Ref Range   Hgb A1c MFr Bld 6.2 4.6 - 6.5 %  PSA  Result Value Ref Range   PSA 0.45 0.10 - 4.00 ng/mL  Vitamin B12  Result Value Ref Range   Vitamin B-12 498 211 - 911 pg/mL      The ASCVD Risk score (Arnett DK, et al., 2019) failed to calculate for the following reasons:   The 2019 ASCVD risk score is only valid for ages 32 to 32    Assessment & Plan:   B12 deficiency -     Vitamin B12 -     Vitamin B-12; Take 1 tablet (1,000 mcg total) by mouth daily.  Dispense: 90 tablet; Refill: 2  Prediabetes -     Basic metabolic panel -     Hemoglobin A1c  Mixed hyperlipidemia -     LDL cholesterol, direct  Frontal headache -     DG Sinuses Complete; Future  Seasonal allergic rhinitis due to pollen -     Cetirizine HCl; Take 1 tablet (10 mg total) by mouth at bedtime.  Dispense: 30 tablet; Refill: 11 -     Ambulatory referral to Allergy -     DG Sinuses Complete; Future  Benign prostatic hyperplasia with nocturia -     PSA    Return in about 3 months (around 08/04/2023).  Will stop Flonase and start Zyrtec at at bedtime.  Allergist referral.  Plain sinus films.  Discussed using metformin if A1c remains elevated.  Information was given on prediabetes  Mliss Sax, MD

## 2023-05-05 MED ORDER — METFORMIN HCL ER 500 MG PO TB24: 500.0000 mg | ORAL_TABLET | Freq: Every evening | ORAL | 1 refills | Status: DC

## 2023-05-05 MED ORDER — VITAMIN B-12 1000 MCG PO TABS
1000.0000 ug | ORAL_TABLET | Freq: Every day | ORAL | 2 refills | Status: AC
Start: 2023-05-05 — End: ?

## 2023-05-05 NOTE — Addendum Note (Signed)
Addended by: Andrez Grime on: 05/05/2023 12:55 PM   Modules accepted: Orders

## 2023-05-05 NOTE — Addendum Note (Signed)
Addended by: Andrez Grime on: 05/05/2023 12:56 PM   Modules accepted: Orders

## 2023-05-10 ENCOUNTER — Encounter: Payer: Self-pay | Admitting: Internal Medicine

## 2023-05-10 ENCOUNTER — Ambulatory Visit (INDEPENDENT_AMBULATORY_CARE_PROVIDER_SITE_OTHER): Payer: Medicare Other | Admitting: Internal Medicine

## 2023-05-10 VITALS — HR 77 | Temp 98.2°F | Resp 16 | Ht 69.5 in | Wt 209.1 lb

## 2023-05-10 DIAGNOSIS — J849 Interstitial pulmonary disease, unspecified: Secondary | ICD-10-CM

## 2023-05-10 DIAGNOSIS — J3089 Other allergic rhinitis: Secondary | ICD-10-CM | POA: Diagnosis not present

## 2023-05-10 DIAGNOSIS — Z87891 Personal history of nicotine dependence: Secondary | ICD-10-CM

## 2023-05-10 DIAGNOSIS — H1045 Other chronic allergic conjunctivitis: Secondary | ICD-10-CM

## 2023-05-10 MED ORDER — OLOPATADINE HCL 0.2 % OP SOLN: 1.0000 [drp] | OPHTHALMIC | 5 refills | Status: AC

## 2023-05-10 MED ORDER — AZELASTINE-FLUTICASONE 137-50 MCG/ACT NA SUSP: 1.0000 | Freq: Two times a day (BID) | NASAL | 5 refills | Status: DC

## 2023-05-10 NOTE — Progress Notes (Signed)
New Patient Note  RE: Gerald Shelton MRN: [id number] DOB: November 04, 1940 Date of Office Visit: 05/10/2023  Consult requested by: Mliss Sax,* Primary care provider: Mliss Sax, MD  Chief Complaint: Sinus Problem (Been having sinus headaches), Sinusitis, and Allergic Rhinitis  (Pt feels that he's allergic to something like that's causing him lungs issues.)  History of Present Illness: I had the pleasure of seeing Gerald Shelton for initial evaluation at the [facility] on 05/12/2023. He is a 82 y.o. male  with PMHx of ILD who is referred here by Mliss Sax, MD for the evaluation of dyspnea and rhinitis   History obtained from patient, chart review.  Chronic rhinitis: started as an infant, improved as a young adult, still has symptoms in  Fall every year  Symptoms include:  headache, rhinorrhea, post nasal drainage, watery eyes, and itchy eyes denies recurrent sinus and ear infections  Occurs seasonally-fall  Potential triggers: fall season  Treatments tried: flonase (no benefit), improvement after stopping flonase  Previous allergy testing:  As a teenager, cannot recall results  History of reflux/heartburn:  rare severe flares, has been on omeprazole, pepcid   History of chronic sinusitis or sinus surgery:  nasal surgery to fix previous trauma  Nonallergic triggers:  denies     He was diagnosed with interstitial lung disease after hospitalization on 10/23 for fever and hypoxemia.  Bronchoscopy showed BAL with 44% eosinophils.  He was treated with antibiotics.  He had persistent exercise hypoxemia and is on home oxygen 1 to 3 L.  He has received high-dose systemic steroids (off since 12/03/22) and has had some radiographic improvement.  He is followed by [facility] pulmonary had a second opinion at [facility] on 01/24/2023 for evaluation of antifibrotic lytics.  At that time due to improvement they decided to hold off on further treatment.  ILD Previous  Diagnostics:  The following were within normal limits: CCP, RF, myositis panel, CK, aldolase, IgG, CRP, ENA, sed rate   The following were abnormal: ANA 1:160, HP panel ( P chrosognum, T viride)   PFTS Date: FVC (% Pred) FEV1 (% Pred) ratio TLC DLCO low sat dist  01/24/23 3.06 (82) 2.52 (92) 82 16.32 (68)  09/27/22 2.85 (77) 2.31 (84) 81% 4.32 (62) 11.89 (50) 88% 2L 420 m   Chest HRCT 09/14/22:No pneumothorax. No pleural effusion. Mild paraseptal and centrilobular emphysema. No acute consolidative airspace disease, lung masses or significant pulmonary nodules. No significant lobular air trapping or evidence of tracheobronchomalacia on the expiration sequence. Moderate patchy  confluent subpleural reticulation and ground-glass opacity throughout both lungs with associated moderate traction bronchiectasis and  rchitectural distortion. There is a basilar predominance to these findings. No frank honeycombing. No appreciable interval change since recent 07/03/2022 chest CT angiogram study, noting limited comparison due to hypoventilatory change on the prior scan.   Pulmonary Notes: 01/24/23:" ILD (interstitial lung disease) (CMS-HCC) The etiology of the patient's Interstitial Lung Disease is unknown.  It seems he may have had a distinct acute process in October 2023, either an acute bacterial pneumonia superimposed on his chronic lung disease or eosinophilic pneumonia. His CT scan shows significant improvement in the prior groundglass opacities with residual subpleural reticular change and emphysema than has been stable since 09/2022. His symptoms and PFTs have also improved since his initial visit despite reduction in his home exposures to birds, etc.   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   -FVL, DLCO and 6 minute walk again in 4 months  - Continue to hold prednisone given improvement - Continue to hold off on antifibrotic's given lack of progressive fibrotic changes on imagin gor worsening PFTs. We discussed in some detail pirfenidone and nintedanib previously. -Advised either removal of bird exposure or use of N95 masks when patient is interacting with birds. Regardless he will be at some risk for inhalational exposure if there is domiciliary contamination with bird feather/droppings. -Make sure he remains up-to-date on vaccinations  Pulmonary emphysema (CMS-HCC) -Observed on chest CT scan -Could consider trial of inhaled bronchodilators at some point if he becomes more dyspneic but he did not have any obstruction on PFTs -Already quit smoking  Chronic respiratory failure with hypoxia (CMS-HCC) -Continue supplemental oxygen with exertion, 6 minute walk at follow up  -Patient has POC  PCP Proph: - not indicated  [facility] - Will defer to PCP"  Assessment and Plan: Gerald Shelton is a 82 y.o. male with: Other allergic rhinitis - Plan: Allergens w/Total IgE Area 2  Other chronic allergic conjunctivitis of both eyes  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HCC) - Plan: Spirometry with Graph  History of tobacco use   Plan: Patient Instructions  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 will get blood work instead   - Prevention:  - allergen avoidance when possible - consider allergy shots as long term control of your symptoms by teaching your immune system to be more tolerant of your allergy triggers  - Symptom control: - Start  Dymista   1-2 sprays in each nostril twice a day  - Sample of ryaltris given, this is equivalent to dymista which is covered by insurance  - Start Antihistamine: Zyrtec 10mg  daily  -Options include Zyrtec (Cetirizine) 10mg , Claritin  (Loratadine) 10mg , Allegra (Fexofenadine) 180mg , or Xyzal (Levocetirinze) 5mg  - Can be purchased over-the-counter if not covered by insurance.  Allergic Conjunctivitis:  - Start Allergy Eye drops-great options include Pataday (Olopatadine) or Zaditor (ketotifen) for eye symptoms daily as needed-both sold over the counter if not covered by insurance. and Rewetting Drops such as Systane,TheraTears, etc  -Avoid eye drops that say red eye relief as they may contain medications that dry out your eyes.   ILD  - Our allergy testing is not great for evaluating causes of interstitial lung disease -Continue to follow with pulmonary and take all medications as prescribed by them   GERD  - Start dietary and lifestyle modifications as below  - Start famotidine 20mg  twice daily (this will help GERD and possible allergies as well)   Follow up: we will call you with blood work, otherwise follow up in clinic in 6 months   Thank you so much for letting me partake in your care today.  Don't hesitate to reach out if you have any additional concerns!  Ferol Luz, MD  [facility] [place] [place]  Gastroesophageal Reflux Induced Respiratory Disease and Laryngopharyngeal Reflux (LPR): Gastroesophageal reflux disease (GERD) is a condition where the contents of the stomach reflux or back up into the esophagus or swallowing tube.  This can result in a variety of clinical symptoms including classic symptoms and atypical symptoms.  Classic symptoms of GERD include: heartburn, chest pain, acid taste in the mouth, and difficulty in  swallowing.  Atypical symptoms of GERD include laryngopharyngeal reflux (LPR) and asthma.  LPR occurs when stomach reflux comes all the way up to the throat.  Clinical symptoms include hoarseness, raspy voice, laryngitis, throat clearing, postnasal drip, mucus stuck in the throat, a sensation of a lump in the throat, sore throat, and cough.  Most patients with LPR do not  have classic symptoms of GERD.  Asthma can also be triggered by GERD.  The acid stomach fluid can stimulate nerve fibers in the esophagus which can cause an increase in bronchial muscle tone and narrowing of the airways.  Acid stomach contents may also reflux into the trachea and bronchi of the lungs where it can trigger an asthma attack.  Many people with GERD triggered asthma do not have classic symptoms of GERD.  Diagnosis of LPR and GERD induced asthma is frequently made from a typical history and response to medications.  It may take several months of medications to see a good response.  Occasionally, a 24-hour esophageal pH probe study must be performed.  Treatment of GERD/LPR includes:   Modification of diet and lifestyle Stop smoking Avoid overeating and lose weight Avoid acidic and fatty foods, chocolate, onions, garlic, peppermint Elevate the head of your bed 6 to 8 inches with blocks or wedge Medications Zantac, Pepcid, Axid, Tagamet Prilosec, Prevacid, Aciphex, Protonix, Nexium Surgery       Meds ordered this encounter  Medications   Azelastine-Fluticasone 137-50 MCG/ACT SUSP    Sig: Place 1 spray into the nose 2 (two) times daily.    Dispense:  23 g    Refill:  5   Olopatadine HCl (PATADAY) 0.2 % SOLN    Sig: Place 1 drop into both eyes 1 day or 1 dose.    Dispense:  2.5 mL    Refill:  5   Lab Orders         Allergens w/Total IgE Area 2      Other allergy screening: Asthma:  ILD as above  Rhino conjunctivitis: yes Food allergy: no Medication allergy:  intolerance of zocor and augmentin  Hymenoptera allergy: no Urticaria: no Eczema:no History of recurrent infections suggestive of immunodeficency: no  Diagnostics: Spirometry:  Tracings reviewed. His effort: Poor effort, data can not be interpreted. FVC: 2.69L FEV1: 2.12L, 75% predicted FEV1/FVC ratio: 79% Interpretation: Spirometry consistent with possible restrictive disease.  Please see scanned  spirometry results for details.  Skin Testing: None.    Past Medical History: Patient Active Problem List   Diagnosis Date Noted   Benign prostatic hyperplasia with nocturia 01/31/2023   Elevated glucose 11/02/2022   Malaise and fatigue 07/11/2022   Hypoxia 07/05/2022   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HCC) 07/04/2022   CAP (community acquired pneumonia) 07/04/2022   Acute respiratory failure with hypoxia (HCC) 07/03/2022   Memory difficulty 03/31/2022   Seasonal allergic rhinitis due to pollen 03/31/2022   Excessive cerumen in left ear canal 03/31/2022   Muscle cramps 09/14/2021   Sinus pressure 08/09/2021   Generalized headaches 08/09/2021   B12 deficiency 03/24/2020   Neuropathy 03/19/2020   Plantar fasciitis, bilateral 03/19/2020   Medicare annual wellness visit, subsequent 10/16/2018   APC (atrial premature contractions) 08/14/2018   Gastroesophageal reflux disease without esophagitis 08/14/2018   Mixed hyperlipidemia 08/14/2018   GAD (generalized anxiety disorder) 08/14/2018   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   Past Medical History:  Diagnosis Date   A-fib (HCC) 10/25/2016   per pt not afib PAC's   Anxiety  attack    Atrial premature complexes    Cancer (HCC)    SKIN, ON FACE   Chest pain 10/25/2016   ED (erectile dysfunction)    GERD (gastroesophageal reflux disease)    Hay fever    Hyperlipidemia    Irregular heartbeat    PUD (peptic ulcer disease)    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   Past Surgical History: Past Surgical History:  Procedure Laterality Date   BRONCHIAL WASHINGS  07/28/2022   Procedure: BRONCHIAL WASHINGS;  Surgeon: Karren Burly, MD;  Location: [facility];  Service: Endoscopy;;   EYE SURGERY     cataract removed form right eye   HERNIA REPAIR     x2   NASAL SEPTUM SURGERY     VIDEO BRONCHOSCOPY N/A 07/28/2022   Procedure: VIDEO BRONCHOSCOPY WITHOUT FLUORO;  Surgeon: Karren Burly, MD;   Location: [facility];  Service: Endoscopy;  Laterality: N/A;   Medication List:  Current Outpatient Medications  Medication Sig Dispense Refill   atorvastatin (LIPITOR) 20 MG tablet TAKE 1 TABLET BY MOUTH EVERY DAY 90 tablet 1   augmented betamethasone dipropionate (DIPROLENE-AF) 0.05 % cream Apply 1 Application topically daily as needed (itching/rash.).     Azelastine-Fluticasone 137-50 MCG/ACT SUSP Place 1 spray into the nose 2 (two) times daily. 23 g 5   cetirizine (ZYRTEC) 10 MG tablet Take 1 tablet (10 mg total) by mouth at bedtime. 30 tablet 11   finasteride (PROSCAR) 5 MG tablet Take 1 tablet (5 mg total) by mouth daily. 30 tablet 5   metFORMIN (GLUCOPHAGE-XR) 500 MG 24 hr tablet Take 1 tablet (500 mg total) by mouth at bedtime. 90 tablet 1   Multiple Vitamin (MULTIVITAMIN WITH MINERALS) TABS tablet Take 1 tablet by mouth in the morning. One A Day     Olopatadine HCl (PATADAY) 0.2 % SOLN Place 1 drop into both eyes 1 day or 1 dose. 2.5 mL 5   tamsulosin (FLOMAX) 0.4 MG CAPS capsule Take 1 capsule (0.4 mg total) by mouth daily. 90 capsule 1   cyanocobalamin (VITAMIN B12) 1000 MCG tablet Take 1 tablet (1,000 mcg total) by mouth daily. (Patient not taking: Reported on 05/10/2023) 90 tablet 2   No current facility-administered medications for this visit.   Allergies: Allergies  Allergen Reactions   Augmentin [Amoxicillin-Pot Clavulanate] Other (See Comments)    GI Upset   Zocor [Simvastatin] Other (See Comments)    GI Upset   Social History: Social History   Socioeconomic History   Marital status: Married    Spouse name: Not on file   Number of children: 6   Years of education: Not on file   Highest education level: Some college, no degree  Occupational History   Occupation: Retired  Tobacco Use   Smoking status: Former    Current packs/day: 0.00    Types: Cigarettes    Quit date: 10/25/1998    Years since quitting: 24.5    Passive exposure: Never   Smokeless tobacco:  Never  Vaping Use   Vaping status: Never Used  Substance and Sexual Activity   Alcohol use: Yes    Comment: RARE - glass of wine   Drug use: No   Sexual activity: Not Currently  Other Topics Concern   Not on file  Social History Narrative   Not on file   Social Determinants of Health   Financial Resource Strain: Low Risk  (02/20/2023)   Overall Financial Resource Strain (CARDIA)  Difficulty of Paying Living Expenses: Not hard at all  Food Insecurity: No Food Insecurity (01/27/2023)   Hunger Vital Sign    Worried About Running Out of Food in the Last Year: Never true    Ran Out of Food in the Last Year: Never true  Transportation Needs: No Transportation Needs (02/20/2023)   PRAPARE - Administrator, Civil Service (Medical): No    Lack of Transportation (Non-Medical): No  Physical Activity: Insufficiently Active (02/20/2023)   Exercise Vital Sign    Days of Exercise per Week: 1 day    Minutes of Exercise per Session: 20 min  Stress: No Stress Concern Present (02/20/2023)   [employer] of Occupational Health - Occupational Stress Questionnaire    Feeling of Stress : Not at all  Social Connections: Moderately Isolated (02/20/2023)   Social Connection and Isolation Panel [NHANES]    Frequency of Communication with Friends and Family: Never    Frequency of Social Gatherings with Friends and Family: Once a week    Attends Religious Services: Never    Database administrator or Organizations: Yes    Attends Engineer, structural: More than 4 times per year    Marital Status: Married   Lives in a single-family home that is 80 years old.  No roaches in the house and bed is 2 feet of floor.  No dust mite precautions.  Not exposed to fumes, chemicals or dust.  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  No HEPA filter in the home and [place] not near [place]. Smoking: 2 ppd x 40 years, quit 10/14/1998  Occupation: Retired, moved here  from [place] in 2016, worked as a Building control surveyor for 40 years.  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   Objective: Pulse 77   Temp 98.2 F (36.8 C) (Temporal)   Resp 16   Ht 5' 9.5" (1.765 m)   Wt 209 lb 1.6 oz (94.8 kg)   SpO2 94%   BMI 30.44 kg/m  Body mass index is 30.44 kg/m.  General Appearance:  Alert, cooperative, no distress, appears stated age  Head:  Normocephalic, without obvious abnormality, atraumatic  Eyes:  Conjunctiva clear, EOM's intact  Nose: Nares normal,  erythematous nasal mucosa, no visible anterior polyps, and septum midline  Throat: Lips, tongue normal; teeth and gums normal, + cobblestoning  Neck: Supple, symmetrical  Lungs:   Crackles in bilateral bases  , Respirations unlabored, no coughing  Heart:  regular rate and rhythm and no murmur, Appears well perfused  Extremities: No edema  Skin: Skin color, texture, turgor normal, no rashes or lesions on visualized portions of skin  Neurologic: No gross deficits   The plan was reviewed with the patient/family, and all questions/concerned were addressed.  It was my pleasure to see Gerald Shelton today and participate in his care. Please feel free to contact me with any questions or concerns.  Sincerely,  Ferol Luz, MD Allergy & Immunology  [facility] office: [phone] [facility] office: [phone]

## 2023-05-10 NOTE — Patient Instructions (Addendum)
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 will get blood work instead   - Prevention:  - allergen avoidance when possible - consider allergy shots as long term control of your symptoms by teaching your immune system to be more tolerant of your allergy triggers  - Symptom control: - Start  Dymista   1-2 sprays in each nostril twice a day  - Sample of ryaltris given, this is equivalent to dymista which is covered by insurance  - Start Antihistamine: Zyrtec 10mg  daily  -Options include Zyrtec (Cetirizine) 10mg , Claritin (Loratadine) 10mg , Allegra (Fexofenadine) 180mg , or Xyzal (Levocetirinze) 5mg  - Can be purchased over-the-counter if not covered by insurance.  Allergic Conjunctivitis:  - Start Allergy Eye drops-great options include Pataday (Olopatadine) or Zaditor (ketotifen) for eye symptoms daily as needed-both sold over the counter if not covered by insurance. and Rewetting Drops such as Systane,TheraTears, etc  -Avoid eye drops that say red eye relief as they may contain medications that dry out your eyes.   ILD  - Our allergy testing is not great for evaluating causes of interstitial lung disease -Continue to follow with pulmonary and take all medications as prescribed by them   GERD  - Start dietary and lifestyle modifications as below  - Start famotidine 20mg  twice daily (this will help GERD and possible allergies as well)   Follow up: we will call you with blood work, otherwise follow up in clinic in 6 months   Thank you so much for letting me partake in your care today.  Don't hesitate to reach out if you have any additional concerns!  Ferol Luz, MD  [facility] [place], [place]  Gastroesophageal Reflux Induced Respiratory Disease and Laryngopharyngeal Reflux (LPR): Gastroesophageal reflux disease (GERD) is a condition where the contents of the stomach reflux or back up into the esophagus or swallowing tube.   This can result in a variety of clinical symptoms including classic symptoms and atypical symptoms.  Classic symptoms of GERD include: heartburn, chest pain, acid taste in the mouth, and difficulty in swallowing.  Atypical symptoms of GERD include laryngopharyngeal reflux (LPR) and asthma.  LPR occurs when stomach reflux comes all the way up to the throat.  Clinical symptoms include hoarseness, raspy voice, laryngitis, throat clearing, postnasal drip, mucus stuck in the throat, a sensation of a lump in the throat, sore throat, and cough.  Most patients with LPR do not have classic symptoms of GERD.  Asthma can also be triggered by GERD.  The acid stomach fluid can stimulate nerve fibers in the esophagus which can cause an increase in bronchial muscle tone and narrowing of the airways.  Acid stomach contents may also reflux into the trachea and bronchi of the lungs where it can trigger an asthma attack.  Many people with GERD triggered asthma do not have classic symptoms of GERD.  Diagnosis of LPR and GERD induced asthma is frequently made from a typical history and response to medications.  It may take several months of medications to see a good response.  Occasionally, a 24-hour esophageal pH probe study must be performed.  Treatment of GERD/LPR includes:   Modification of diet and lifestyle Stop smoking Avoid overeating and lose weight Avoid acidic and fatty foods, chocolate, onions, garlic, peppermint Elevate the head of your bed 6 to 8 inches with blocks or wedge Medications Zantac, Pepcid, Axid, Tagamet Prilosec, Prevacid, Aciphex, Protonix, Nexium Surgery

## 2023-05-13 ENCOUNTER — Emergency Department (HOSPITAL_BASED_OUTPATIENT_CLINIC_OR_DEPARTMENT_OTHER)
Admission: EM | Admit: 2023-05-13 | Discharge: 2023-05-13 | Disposition: A | Discharge status: Home / Self Care | Payer: Medicare Other | Source: Home / Self Care | Attending: Emergency Medicine | Admitting: Emergency Medicine

## 2023-05-13 ENCOUNTER — Ambulatory Visit
Admission: EM | Admit: 2023-05-13 | Discharge: 2023-05-13 | Disposition: A | Discharge status: Home / Self Care | Payer: Medicare Other

## 2023-05-13 ENCOUNTER — Encounter (HOSPITAL_BASED_OUTPATIENT_CLINIC_OR_DEPARTMENT_OTHER): Payer: Self-pay

## 2023-05-13 ENCOUNTER — Other Ambulatory Visit: Payer: Self-pay

## 2023-05-13 ENCOUNTER — Emergency Department (HOSPITAL_BASED_OUTPATIENT_CLINIC_OR_DEPARTMENT_OTHER): Payer: Medicare Other

## 2023-05-13 DIAGNOSIS — S39012A Strain of muscle, fascia and tendon of lower back, initial encounter: Secondary | ICD-10-CM | POA: Diagnosis not present

## 2023-05-13 DIAGNOSIS — X58XXXA Exposure to other specified factors, initial encounter: Secondary | ICD-10-CM | POA: Insufficient documentation

## 2023-05-13 DIAGNOSIS — M25551 Pain in right hip: Secondary | ICD-10-CM

## 2023-05-13 MED ORDER — METHOCARBAMOL 500 MG PO TABS: 500.0000 mg | ORAL_TABLET | Freq: Two times a day (BID) | ORAL | 0 refills | Status: DC

## 2023-05-13 MED ORDER — LIDOCAINE 5 % EX PTCH: 1.0000 | MEDICATED_PATCH | CUTANEOUS | 0 refills | Status: DC

## 2023-05-13 NOTE — ED Notes (Signed)
 RN reviewed discharge instructions with pt. Pt verbalized understanding and had no further questions. VSS upon discharge.  

## 2023-05-13 NOTE — ED Provider Notes (Signed)
[place] EMERGENCY DEPARTMENT AT [facility] Provider Note   CSN: [id number] Arrival date & time: 05/13/23  1452     History  Chief Complaint  Patient presents with   Hip Pain    Gerald Shelton is a 82 y.o. male with past medical history significant for interstitial lung disease who presents with concern for nontraumatic right hip pain for around 1 week.  He denies any fever, chronic vertigo steroid use, IV drug use, previous history of cancer, denies any previous injury of the same hip.  Notably with some arthritis of back but is not sure where.  Patient does report that he is struggled with sciatica at some time in the past.  He endorses some improvement with wife's muscle relaxant although he is not sure which one he had been taking, and he has tried some aspirin at home without significant relief.  He reports pain is worse with getting up from bed, and walking, but improved with rest.  He denies any numbness, tingling, loss control of bladder, bowel.   Hip Pain       Home Medications Prior to Admission medications   Medication Sig Start Date End Date Taking? Authorizing Provider  atorvastatin (LIPITOR) 20 MG tablet TAKE 1 TABLET BY MOUTH EVERY DAY 02/20/23   Mliss Sax, MD  augmented betamethasone dipropionate (DIPROLENE-AF) 0.05 % cream Apply 1 Application topically daily as needed (itching/rash.). 03/15/22   [provider]  Azelastine-Fluticasone 137-50 MCG/ACT SUSP Place 1 spray into the nose 2 (two) times daily. 05/10/23   Ferol Luz, MD  cetirizine (ZYRTEC) 10 MG tablet Take 1 tablet (10 mg total) by mouth at bedtime. 05/04/23   Mliss Sax, MD  cyanocobalamin (VITAMIN B12) 1000 MCG tablet Take 1 tablet (1,000 mcg total) by mouth daily. Patient not taking: Reported on 05/10/2023 05/05/23   Mliss Sax, MD  finasteride (PROSCAR) 5 MG tablet Take 1 tablet (5 mg total) by mouth daily. 01/31/23   Mliss Sax, MD   metFORMIN (GLUCOPHAGE-XR) 500 MG 24 hr tablet Take 1 tablet (500 mg total) by mouth at bedtime. 05/05/23   Mliss Sax, MD  Multiple Vitamin (MULTIVITAMIN WITH MINERALS) TABS tablet Take 1 tablet by mouth in the morning. One A Day    [provider]  Olopatadine HCl (PATADAY) 0.2 % SOLN Place 1 drop into both eyes 1 day or 1 dose. 05/10/23   Ferol Luz, MD  tamsulosin (FLOMAX) 0.4 MG CAPS capsule Take 1 capsule (0.4 mg total) by mouth daily. 01/31/23   Mliss Sax, MD      Allergies    Augmentin [amoxicillin-pot clavulanate] and Zocor [simvastatin]    Review of Systems   Review of Systems  All other systems reviewed and are negative.   Physical Exam Updated Vital Signs BP (!) 148/91 (BP Location: Right Arm)   Pulse 83   Temp 98.3 F (36.8 C)   Resp 18   SpO2 94%  Physical Exam Vitals and nursing note reviewed.  Constitutional:      General: He is not in acute distress.    Appearance: Normal appearance.  HENT:     Head: Normocephalic and atraumatic.  Eyes:     General:        Right eye: No discharge.        Left eye: No discharge.  Cardiovascular:     Rate and Rhythm: Normal rate and regular rhythm.     Heart sounds: No murmur heard.  No friction rub. No gallop.  Pulmonary:     Effort: Pulmonary effort is normal.     Breath sounds: Normal breath sounds.  Abdominal:     General: Bowel sounds are normal.     Palpations: Abdomen is soft.  Musculoskeletal:     Comments: Intact strength 5/5 bilateral lower extremities, he has some worsening pain with sitting up, as well as flexion of the right hip.  He endorses the pain is most focal just superior to ASIS.  Skin:    General: Skin is warm and dry.     Capillary Refill: Capillary refill takes less than 2 seconds.  Neurological:     Mental Status: He is alert and oriented to person, place, and time.  Psychiatric:        Mood and Affect: Mood normal.        Behavior: Behavior normal.      ED Results / Procedures / Treatments   Labs (all labs ordered are listed, but only abnormal results are displayed) Labs Reviewed - No data to display  EKG None  Radiology DG Hip Unilat W or Wo Pelvis 2-3 Views Right  Result Date: 05/13/2023 CLINICAL DATA:  Nontraumatic right hip pain over the last week EXAM: DG HIP (WITH OR WITHOUT PELVIS) 2-3V RIGHT COMPARISON:  None Available. FINDINGS: No evidence of regional fracture. No joint space narrowing. No osteophyte formation. No sign of avascular necrosis. Surrounding soft tissues appear unremarkable. IMPRESSION: Negative. Electronically Signed   By: Paulina Fusi M.D.   On: 05/13/2023 15:46    Procedures Procedures    Medications Ordered in ED Medications - No data to display  ED Course/ Medical Decision Making/ A&P                                 Medical Decision Making Amount and/or Complexity of Data Reviewed Radiology: ordered.   Patient with low back pain, vs hip pain.  My emergent differential diagnosis includes slipped disc, compression fracture, spondylolisthesis, less clinical concern for epidural abscess or osteomyelitis based on patient history.  Overall with high clinical suspicion for no neurological deficits. Patient is ambulatory. No warning symptoms of back pain including: fecal incontinence, urinary retention or overflow incontinence, night sweats, waking from sleep with back pain, unexplained fevers or weight loss, h/o cancer, IVDU, recent trauma. No concern for cauda equina, epidural abscess, or other serious cause of back pain.  Sent from urgent care for radiographic imaging of the right hip, independently interpreted the radiographic images which shows no acute fracture, dislocation or other significant abnormality at this time, I agree with the Radiologist interpretation.  Overall suspicious for lumbar strain versus gluteus medius syndrome.  Encouraged conservative measures such as rest, ice/heat, Tylenol, and   prescription for Robaxin indicated with orthopedic follow-up if no improvement with conservative management.  Extensive return precautions given, patient discharged in stable condition at this time.  Final Clinical Impression(s) / ED Diagnoses Final diagnoses:  Pain of right hip  Strain of lumbar region, initial encounter    Rx / DC Orders ED Discharge Orders     None         [place] 05/13/23 1559    Benjiman Core, MD 05/13/23 [phone]

## 2023-05-13 NOTE — Discharge Instructions (Addendum)
Please use Tylenol for pain.  You may use 1000 mg of Tylenol every 6 hours.  Not to exceed 4 g of Tylenol within 24 hours.  You can use the muscle relaxant I am prescribing in addition to the above to help with any breakthrough pain.  You can take it up to twice daily.  It is safe to take at night, but I would be cautious taking it during the day as it can cause some drowsiness.  Make sure that you are feeling awake and alert before you get behind the wheel of a car or operate a motor vehicle.  It is not a narcotic pain medication so you are able to take it if it is not making you drowsy and still pilot a vehicle or machinery safely.

## 2023-05-13 NOTE — ED Notes (Signed)
Patient instructed to go to [facility] to have imaging performed due to issues with imaging at our facility

## 2023-05-13 NOTE — ED Triage Notes (Signed)
He c/o non-traumatic right hip pain x 1 week. He denies fever, nor any other sign of current illness. He denies any back pain; also, he denies any paresthesias of extremities, including right foot/toes.

## 2023-05-13 NOTE — ED Triage Notes (Signed)
"  My right hip is hurting, about a week ago started feeling funny, now getting worse". No injury. No redness/swelling known.

## 2023-05-13 NOTE — ED Provider Notes (Incomplete)
[facility]    CSN: [id number] Arrival date & time: 05/13/23  1214      History   Chief Complaint No chief complaint on file.   HPI Gerald Shelton is a 82 y.o. male.   HPI  Past Medical History:  Diagnosis Date   A-fib (HCC) 10/25/2016   per pt not afib PAC's   Anxiety attack    Atrial premature complexes    Cancer (HCC)    SKIN, ON FACE   Chest pain 10/25/2016   ED (erectile dysfunction)    GERD (gastroesophageal reflux disease)    Hay fever    Hyperlipidemia    Irregular heartbeat    PUD (peptic ulcer disease)    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    Patient Active Problem List   Diagnosis Date Noted   Benign prostatic hyperplasia with nocturia 01/31/2023   Elevated glucose 11/02/2022   Malaise and fatigue 07/11/2022   Hypoxia 07/05/2022   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HCC) 07/04/2022   CAP (community acquired pneumonia) 07/04/2022   Acute respiratory failure with hypoxia (HCC) 07/03/2022   Memory difficulty 03/31/2022   Seasonal allergic rhinitis due to pollen 03/31/2022   Excessive cerumen in left ear canal 03/31/2022   Muscle cramps 09/14/2021   Sinus pressure 08/09/2021   Generalized headaches 08/09/2021   B12 deficiency 03/24/2020   Neuropathy 03/19/2020   Plantar fasciitis, bilateral 03/19/2020   Medicare annual wellness visit, subsequent 10/16/2018   APC (atrial premature contractions) 08/14/2018   Gastroesophageal reflux disease without esophagitis 08/14/2018   Mixed hyperlipidemia 08/14/2018   GAD (generalized anxiety disorder) 08/14/2018   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    Past Surgical History:  Procedure Laterality Date   BRONCHIAL WASHINGS  07/28/2022   Procedure: BRONCHIAL WASHINGS;  Surgeon: Karren Burly, MD;  Location: [facility];  Service: Endoscopy;;   EYE SURGERY     cataract removed form right eye   HERNIA REPAIR     x2   NASAL SEPTUM SURGERY     VIDEO BRONCHOSCOPY N/A  07/28/2022   Procedure: VIDEO BRONCHOSCOPY WITHOUT FLUORO;  Surgeon: Karren Burly, MD;  Location: [facility];  Service: Endoscopy;  Laterality: N/A;       Home Medications    Prior to Admission medications   Medication Sig Start Date End Date Taking? Authorizing Provider  atorvastatin (LIPITOR) 20 MG tablet TAKE 1 TABLET BY MOUTH EVERY DAY 02/20/23   Mliss Sax, MD  augmented betamethasone dipropionate (DIPROLENE-AF) 0.05 % cream Apply 1 Application topically daily as needed (itching/rash.). 03/15/22   [provider]  Azelastine-Fluticasone 137-50 MCG/ACT SUSP Place 1 spray into the nose 2 (two) times daily. 05/10/23   Ferol Luz, MD  cetirizine (ZYRTEC) 10 MG tablet Take 1 tablet (10 mg total) by mouth at bedtime. 05/04/23   Mliss Sax, MD  cyanocobalamin (VITAMIN B12) 1000 MCG tablet Take 1 tablet (1,000 mcg total) by mouth daily. Patient not taking: Reported on 05/10/2023 05/05/23   Mliss Sax, MD  finasteride (PROSCAR) 5 MG tablet Take 1 tablet (5 mg total) by mouth daily. 01/31/23   Mliss Sax, MD  metFORMIN (GLUCOPHAGE-XR) 500 MG 24 hr tablet Take 1 tablet (500 mg total) by mouth at bedtime. 05/05/23   Mliss Sax, MD  Multiple Vitamin (MULTIVITAMIN WITH MINERALS) TABS tablet Take 1 tablet by mouth in the morning. One A Day    [provider]  Olopatadine HCl (PATADAY) 0.2 % SOLN Place  1 drop into both eyes 1 day or 1 dose. 05/10/23   Ferol Luz, MD  tamsulosin (FLOMAX) 0.4 MG CAPS capsule Take 1 capsule (0.4 mg total) by mouth daily. 01/31/23   Mliss Sax, MD    Family History Family History  Problem Relation Age of Onset   Diabetes Son    Parkinson's disease Mother    Depression Mother    Dementia Mother     Social History Social History   Tobacco Use   Smoking status: Former    Current packs/day: 0.00    Types: Cigarettes    Quit date: 10/25/1998    Years since  quitting: 24.5    Passive exposure: Never   Smokeless tobacco: Never  Vaping Use   Vaping status: Never Used  Substance Use Topics   Alcohol use: Yes    Comment: RARE - glass of wine   Drug use: No     Allergies   Augmentin [amoxicillin-pot clavulanate] and Zocor [simvastatin]   Review of Systems Review of Systems  Constitutional:  Negative for chills and fever.  Eyes:  Negative for discharge and redness.  Musculoskeletal:  Positive for arthralgias.  Neurological:  Negative for numbness.     Physical Exam Triage Vital Signs ED Triage Vitals  Encounter Vitals Group     BP      Systolic BP Percentile      Diastolic BP Percentile      Pulse      Resp      Temp      Temp src      SpO2      Weight      Height      Head Circumference      Peak Flow      Pain Score      Pain Loc      Pain Education      Exclude from Growth Chart    No data found.  Updated Vital Signs There were no vitals taken for this visit.    Physical Exam Vitals and nursing note reviewed.  Constitutional:      General: He is not in acute distress.    Appearance: Normal appearance. He is not ill-appearing.  HENT:     Head: Normocephalic and atraumatic.  Eyes:     Conjunctiva/sclera: Conjunctivae normal.  Cardiovascular:     Rate and Rhythm: Normal rate.  Pulmonary:     Effort: Pulmonary effort is normal.  Neurological:     Mental Status: He is alert.  Psychiatric:        Mood and Affect: Mood normal.        Behavior: Behavior normal.        Thought Content: Thought content normal.      UC Treatments / Results  Labs (all labs ordered are listed, but only abnormal results are displayed) Labs Reviewed - No data to display  EKG   Radiology No results found.  Procedures Procedures (including critical care time)  Medications Ordered in UC Medications - No data to display  Initial Impression / Assessment and Plan / UC Course  I have reviewed the triage vital signs and  the nursing notes.  Pertinent labs & imaging results that were available during my care of the patient were reviewed by me and considered in my medical decision making (see chart for details).     *** Final Clinical Impressions(s) / UC Diagnoses   Final diagnoses:  None   Discharge Instructions  None    ED Prescriptions   None    PDMP not reviewed this encounter.

## 2023-05-13 NOTE — Discharge Instructions (Signed)
  Please report to [facility] for xray  [address] [place], [place]

## 2023-05-14 ENCOUNTER — Encounter: Payer: Self-pay | Admitting: Physician Assistant

## 2023-05-14 ENCOUNTER — Telehealth: Payer: Self-pay | Admitting: Physician Assistant

## 2023-05-14 NOTE — Telephone Encounter (Signed)
Called to notify of xray results- patient had been seen in ED by provider last night.

## 2023-05-15 ENCOUNTER — Encounter: Payer: Self-pay | Admitting: Internal Medicine

## 2023-05-19 ENCOUNTER — Other Ambulatory Visit: Payer: Self-pay

## 2023-05-19 MED ORDER — RYALTRIS 665-25 MCG/ACT NA SUSP: 2.0000 | Freq: Two times a day (BID) | NASAL | 5 refills | Status: DC

## 2023-05-19 NOTE — Progress Notes (Signed)
Blood work was negative to all environmentals.  I do not think allergies are playing a part in his symptoms.  Can someone let patient know?

## 2023-06-05 ENCOUNTER — Telehealth: Payer: Self-pay | Admitting: Family Medicine

## 2023-06-05 NOTE — Telephone Encounter (Signed)
Pt is requesting that Dr Doreene Burke put in to stop and return his stationary oxygen tank. He feels his lungs are better and doesn't need it any longer.   Please call [phone]

## 2023-07-27 ENCOUNTER — Other Ambulatory Visit: Payer: Self-pay | Admitting: Family Medicine

## 2023-07-27 DIAGNOSIS — N401 Enlarged prostate with lower urinary tract symptoms: Secondary | ICD-10-CM

## 2023-08-08 ENCOUNTER — Ambulatory Visit: Payer: Medicare Other | Admitting: Family Medicine

## 2023-08-13 ENCOUNTER — Other Ambulatory Visit: Payer: Self-pay | Admitting: Family Medicine

## 2023-08-13 DIAGNOSIS — E782 Mixed hyperlipidemia: Secondary | ICD-10-CM

## 2023-08-15 ENCOUNTER — Encounter: Payer: Self-pay | Admitting: Family Medicine

## 2023-08-15 ENCOUNTER — Ambulatory Visit (INDEPENDENT_AMBULATORY_CARE_PROVIDER_SITE_OTHER): Payer: Medicare Other | Admitting: Family Medicine

## 2023-08-15 VITALS — BP 114/76 | HR 78 | Temp 97.5°F | Ht 70.0 in | Wt 211.2 lb

## 2023-08-15 DIAGNOSIS — N401 Enlarged prostate with lower urinary tract symptoms: Secondary | ICD-10-CM | POA: Diagnosis not present

## 2023-08-15 DIAGNOSIS — R7303 Prediabetes: Secondary | ICD-10-CM

## 2023-08-15 DIAGNOSIS — J301 Allergic rhinitis due to pollen: Secondary | ICD-10-CM | POA: Diagnosis not present

## 2023-08-15 DIAGNOSIS — E538 Deficiency of other specified B group vitamins: Secondary | ICD-10-CM | POA: Diagnosis not present

## 2023-08-15 DIAGNOSIS — R351 Nocturia: Secondary | ICD-10-CM

## 2023-08-15 NOTE — Progress Notes (Unsigned)
Established Patient Office Visit   Subjective:  Patient ID: Gerald Shelton, male    DOB: 09/01/1941  Age: 82 y.o. MRN: [id number]  Chief Complaint  Patient presents with   Medical Management of Chronic Issues    3 moonth follow up. Pt is not fasting. No concerns or questions.     HPI Encounter Diagnoses  Name Primary?   B12 deficiency Yes   Prediabetes    Benign prostatic hyperplasia with nocturia    Seasonal allergic rhinitis due to pollen    For follow-up of above.  Continues with the 1000 mcg of cyanocobalamin mean.  He discontinued the Glucophage about 6 weeks ago after having taken it for 6 weeks.  He was having difficulty distinguishing between what was gas and liquid stool.  Urine flow is better with the finasteride and tamsulosin.  Frontal headache resolved after he discontinued the Flonase.  Zyrtec seems to be helping a great deal. {History (Optional):23778}  Review of Systems  Constitutional: Negative.   HENT: Negative.    Eyes:  Negative for blurred vision, discharge and redness.  Respiratory: Negative.    Cardiovascular: Negative.   Gastrointestinal:  Negative for abdominal pain.  Genitourinary: Negative.   Musculoskeletal: Negative.  Negative for myalgias.  Skin:  Negative for rash.  Neurological:  Negative for tingling, loss of consciousness and weakness.  Endo/Heme/Allergies:  Negative for polydipsia.     Current Outpatient Medications:    atorvastatin (LIPITOR) 20 MG tablet, TAKE 1 TABLET BY MOUTH EVERY DAY, Disp: 90 tablet, Rfl: 1   augmented betamethasone dipropionate (DIPROLENE-AF) 0.05 % cream, Apply 1 Application topically daily as needed (itching/rash.)., Disp: , Rfl:    cetirizine (ZYRTEC) 10 MG tablet, Take 1 tablet (10 mg total) by mouth at bedtime., Disp: 30 tablet, Rfl: 11   cyanocobalamin (VITAMIN B12) 1000 MCG tablet, Take 1 tablet (1,000 mcg total) by mouth daily., Disp: 90 tablet, Rfl: 2   finasteride (PROSCAR) 5 MG tablet, Take 1 tablet (5 mg  total) by mouth daily., Disp: 30 tablet, Rfl: 5   Multiple Vitamin (MULTIVITAMIN WITH MINERALS) TABS tablet, Take 1 tablet by mouth in the morning. One A Day, Disp: , Rfl:    Olopatadine HCl (PATADAY) 0.2 % SOLN, Place 1 drop into both eyes 1 day or 1 dose., Disp: 2.5 mL, Rfl: 5   Olopatadine-Mometasone (RYALTRIS) 665-25 MCG/ACT SUSP, Place 2 sprays into both nostrils 2 (two) times daily., Disp: 29 g, Rfl: 5   tamsulosin (FLOMAX) 0.4 MG CAPS capsule, TAKE 1 CAPSULE BY MOUTH EVERY DAY, Disp: 90 capsule, Rfl: 1   Azelastine-Fluticasone 137-50 MCG/ACT SUSP, Place 1 spray into the nose 2 (two) times daily., Disp: 23 g, Rfl: 5   lidocaine (LIDODERM) 5 %, Place 1 patch onto the skin daily. Remove & Discard patch within 12 hours or as directed by MD, Disp: 30 patch, Rfl: 0   metFORMIN (GLUCOPHAGE-XR) 500 MG 24 hr tablet, Take 1 tablet (500 mg total) by mouth at bedtime., Disp: 90 tablet, Rfl: 1   methocarbamol (ROBAXIN) 500 MG tablet, Take 1 tablet (500 mg total) by mouth 2 (two) times daily., Disp: 20 tablet, Rfl: 0   Objective:     BP 114/76   Pulse 78   Temp (!) 97.5 F (36.4 C)   Ht 5\' 10"  (1.778 m)   Wt 211 lb 3.2 oz (95.8 kg)   SpO2 94%   BMI 30.30 kg/m  {Vitals History (Optional):23777}  Physical Exam Constitutional:      General:  He is not in acute distress.    Appearance: Normal appearance. He is not ill-appearing, toxic-appearing or diaphoretic.  HENT:     Head: Normocephalic and atraumatic.     Right Ear: External ear normal.     Left Ear: External ear normal.     Mouth/Throat:     Mouth: Mucous membranes are moist.     Pharynx: Oropharynx is clear. No oropharyngeal exudate or posterior oropharyngeal erythema.  Eyes:     General: No scleral icterus.       Right eye: No discharge.        Left eye: No discharge.     Extraocular Movements: Extraocular movements intact.     Conjunctiva/sclera: Conjunctivae normal.     Pupils: Pupils are equal, round, and reactive to light.   Cardiovascular:     Rate and Rhythm: Normal rate and regular rhythm.  Pulmonary:     Effort: Pulmonary effort is normal. No respiratory distress.     Breath sounds: Examination of the right-lower field reveals rhonchi. Examination of the left-lower field reveals rhonchi. Rhonchi present. No wheezing or rales.  Musculoskeletal:     Cervical back: No rigidity or tenderness.  Skin:    General: Skin is warm and dry.  Neurological:     Mental Status: He is alert and oriented to person, place, and time.  Psychiatric:        Mood and Affect: Mood normal.        Behavior: Behavior normal.      No results found for any visits on 08/15/23.  {Labs (Optional):23779}  The ASCVD Risk score (Arnett DK, et al., 2019) failed to calculate for the following reasons:   The 2019 ASCVD risk score is only valid for ages 53 to 73    Assessment & Plan:   B12 deficiency -     Vitamin B12  Prediabetes -     Hemoglobin A1c  Benign prostatic hyperplasia with nocturia  Seasonal allergic rhinitis due to pollen    Return in about 3 months (around 11/15/2023).  May try taking the metformin to every other day.  Continue high-dose B12.  Continue finasteride with tamsulosin.  Continue cetirizine.  Mliss Sax, MD

## 2023-08-16 LAB — VITAMIN B12: Vitamin B-12: 558 pg/mL (ref 211–911)

## 2023-08-16 LAB — HEMOGLOBIN A1C: Hgb A1c MFr Bld: 6.2 % (ref 4.6–6.5)

## 2023-08-17 ENCOUNTER — Encounter: Payer: Self-pay | Admitting: Family Medicine

## 2023-08-17 DIAGNOSIS — N401 Enlarged prostate with lower urinary tract symptoms: Secondary | ICD-10-CM

## 2023-08-17 MED ORDER — FINASTERIDE 5 MG PO TABS: 5.0000 mg | ORAL_TABLET | Freq: Every day | ORAL | 5 refills | Status: DC

## 2023-09-14 ENCOUNTER — Ambulatory Visit (INDEPENDENT_AMBULATORY_CARE_PROVIDER_SITE_OTHER): Payer: Medicare Other | Admitting: Family Medicine

## 2023-09-14 ENCOUNTER — Encounter: Payer: Self-pay | Admitting: Family Medicine

## 2023-09-14 VITALS — BP 126/72 | HR 70 | Temp 98.0°F | Ht 70.0 in | Wt 210.4 lb

## 2023-09-14 DIAGNOSIS — R7303 Prediabetes: Secondary | ICD-10-CM

## 2023-09-14 DIAGNOSIS — M545 Low back pain, unspecified: Secondary | ICD-10-CM | POA: Diagnosis not present

## 2023-09-14 MED ORDER — MELOXICAM 7.5 MG PO TABS: 7.5000 mg | ORAL_TABLET | Freq: Every day | ORAL | 0 refills | Status: DC

## 2023-09-14 MED ORDER — EMPAGLIFLOZIN 10 MG PO TABS: 10.0000 mg | ORAL_TABLET | Freq: Every day | ORAL | 2 refills | Status: DC

## 2023-09-14 NOTE — Progress Notes (Signed)
Established Patient Office Visit   Subjective:  Patient ID: Gerald Shelton, male    DOB: 12/11/1940  Age: 82 y.o. MRN: [id number]  Chief Complaint  Patient presents with   Hip Pain    Right hip pain x 10 days. Pt states pain came once before.     Hip Pain  Pertinent negatives include no tingling.   Encounter Diagnoses  Name Primary?   Acute right-sided low back pain without sciatica Yes   Prediabetes    10-day history of pain in his right hip area.  It is nonradiating.  There is no numbness or tingling in his lower extremities.  Denies new onset weakness in his lower extremities.  No changes in bowel or bladder function.  Longstanding history of intermittent lumbago.  Similar experience back in July that resolved.  X-rays of the right hip were normal.  No comment was made about the SI joints.   Review of Systems  Constitutional: Negative.   HENT: Negative.    Eyes:  Negative for blurred vision, discharge and redness.  Respiratory: Negative.    Cardiovascular: Negative.   Gastrointestinal:  Negative for abdominal pain.  Genitourinary: Negative.   Musculoskeletal:  Positive for back pain and joint pain. Negative for myalgias.  Skin:  Negative for rash.  Neurological:  Negative for tingling, loss of consciousness and weakness.  Endo/Heme/Allergies:  Negative for polydipsia.     Current Outpatient Medications:    atorvastatin (LIPITOR) 20 MG tablet, TAKE 1 TABLET BY MOUTH EVERY DAY, Disp: 90 tablet, Rfl: 1   augmented betamethasone dipropionate (DIPROLENE-AF) 0.05 % cream, Apply 1 Application topically daily as needed (itching/rash.)., Disp: , Rfl:    cyanocobalamin (VITAMIN B12) 1000 MCG tablet, Take 1 tablet (1,000 mcg total) by mouth daily., Disp: 90 tablet, Rfl: 2   empagliflozin (JARDIANCE) 10 MG TABS tablet, Take 1 tablet (10 mg total) by mouth daily before breakfast., Disp: 30 tablet, Rfl: 2   finasteride (PROSCAR) 5 MG tablet, Take 1 tablet (5 mg total) by mouth daily.,  Disp: 30 tablet, Rfl: 5   meloxicam (MOBIC) 7.5 MG tablet, Take 1 tablet (7.5 mg total) by mouth daily., Disp: 30 tablet, Rfl: 0   Multiple Vitamin (MULTIVITAMIN WITH MINERALS) TABS tablet, Take 1 tablet by mouth in the morning. One A Day, Disp: , Rfl:    Olopatadine HCl (PATADAY) 0.2 % SOLN, Place 1 drop into both eyes 1 day or 1 dose., Disp: 2.5 mL, Rfl: 5   cetirizine (ZYRTEC) 10 MG tablet, Take 1 tablet (10 mg total) by mouth at bedtime., Disp: 30 tablet, Rfl: 11   tamsulosin (FLOMAX) 0.4 MG CAPS capsule, TAKE 1 CAPSULE BY MOUTH EVERY DAY, Disp: 90 capsule, Rfl: 1   Objective:     BP 126/72   Pulse 70   Temp 98 F (36.7 C)   Ht 5\' 10"  (1.778 m)   Wt 210 lb 6.4 oz (95.4 kg)   SpO2 96%   BMI 30.19 kg/m    Physical Exam Constitutional:      General: He is not in acute distress.    Appearance: Normal appearance. He is not ill-appearing, toxic-appearing or diaphoretic.  HENT:     Head: Normocephalic and atraumatic.     Right Ear: External ear normal.     Left Ear: External ear normal.  Eyes:     General: No scleral icterus.       Right eye: No discharge.        Left eye: No discharge.  Extraocular Movements: Extraocular movements intact.     Conjunctiva/sclera: Conjunctivae normal.  Pulmonary:     Effort: Pulmonary effort is normal. No respiratory distress.  Musculoskeletal:     Lumbar back: Tenderness present. No bony tenderness. Decreased range of motion. Negative right straight leg raise test and negative left straight leg raise test.       Back:  Skin:    General: Skin is warm and dry.  Neurological:     Mental Status: He is alert and oriented to person, place, and time.     Motor: No weakness.     Deep Tendon Reflexes:     Reflex Scores:      Patellar reflexes are 1+ on the right side and 1+ on the left side.      Achilles reflexes are 1+ on the right side and 1+ on the left side. Psychiatric:        Mood and Affect: Mood normal.        Behavior: Behavior  normal.      No results found for any visits on 09/14/23.    The ASCVD Risk score (Arnett DK, et al., 2019) failed to calculate for the following reasons:   The 2019 ASCVD risk score is only valid for ages 32 to 9    Assessment & Plan:   Acute right-sided low back pain without sciatica -     Meloxicam; Take 1 tablet (7.5 mg total) by mouth daily.  Dispense: 30 tablet; Refill: 0 -     Ambulatory referral to Sports Medicine  Prediabetes -     Empagliflozin; Take 1 tablet (10 mg total) by mouth daily before breakfast.  Dispense: 30 tablet; Refill: 2    Return Should have follow-up scheduled for February..  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  Start meloxicam.  Referral to sports medicine.  Intolerant of metformin.  Start empagliflozin 10 mg daily.  Information about empagliflozin given.  Mliss Sax, MD

## 2023-09-21 ENCOUNTER — Ambulatory Visit: Payer: Medicare Other | Admitting: Family Medicine

## 2023-09-26 ENCOUNTER — Ambulatory Visit: Payer: Medicare Other | Admitting: Family Medicine

## 2023-09-26 VITALS — BP 132/78 | HR 63 | Ht 70.0 in | Wt 208.0 lb

## 2023-09-26 DIAGNOSIS — G8929 Other chronic pain: Secondary | ICD-10-CM

## 2023-09-26 DIAGNOSIS — M25551 Pain in right hip: Secondary | ICD-10-CM | POA: Diagnosis not present

## 2023-09-26 NOTE — Progress Notes (Signed)
   Rubin Payor, PhD, LAT, ATC acting as a scribe for Gerald Graham, MD.  Gerald Shelton is a 82 y.o. male who presents to [facility] at [facility] today for R hip pain ongoing since the end of November. This is the 2nd flare of R hip pain. Pt locates pain to the lateral aspect of his R hip. He notes something the pain will go into the anterior aspect and slightly into his buttock  Radiating pain: no Aggravates: trunk flexion, picking up an object from the floor Treatments tried: meloxicam  Dx imaging: 05/13/23 Bilat hip/pelvis  Pertinent review of systems: No fevers or chills  Relevant historical information: Interstitial lung disease   Exam:  BP 132/78   Pulse 63   Ht 5\' 10"  (1.778 m)   Wt 208 lb (94.3 kg)   SpO2 95%   BMI 29.84 kg/m  General: Well Developed, well nourished, and in no acute distress.   MSK: Hips bilaterally normal appearing Range of motion slightly limited flexion and internal rotation.  Some pain with right hip flexion and internal rotation. Mildly tender palpation right lateral hip.  Hip abduction and external rotation strength is mildly diminished.    Lab and Radiology Results  X-ray images right hip with AP bilateral pelvis obtained on May 13, 2023 personally and independently interpreted. Cam type FAI pattern is present bilaterally with no significant arthritis. Await formal radiology review    Assessment and Plan: 82 y.o. male with chronic right hip pain.  Pain has pain predominantly in the lateral posterior hip due to hip abductor tendinopathy and weakness.  Additionally has some anterior hip pain due to hip impingement based on appearance of x-ray.  Plan for physical therapy referral.  He lives near [place] so we will use [facility] in [place].  Recheck in 2 months.   Avoid high-dose oral NSAIDs. PDMP not reviewed this encounter. Orders Placed This Encounter  Procedures   Ambulatory referral to Physical Therapy     Referral Priority:   Routine    Referral Type:   Physical Medicine    Referral Reason:   Specialty Services Required    Requested Specialty:   Physical Therapy    Number of Visits Requested:   1   No orders of the defined types were placed in this encounter.    Discussed warning signs or symptoms. Please see discharge instructions. Patient expresses understanding.   The above documentation has been reviewed and is accurate and complete Gerald Shelton, M.D.

## 2023-09-26 NOTE — Patient Instructions (Addendum)
Thank you for coming in today.   I've referred you to Physical Therapy.  Let us know if you don't hear from them in one week.   Recheck in about 2 months.   Let me know if this is not working.

## 2023-10-11 ENCOUNTER — Encounter: Payer: Self-pay | Admitting: Family Medicine

## 2023-10-24 ENCOUNTER — Other Ambulatory Visit: Payer: Self-pay

## 2023-10-24 ENCOUNTER — Encounter: Payer: Self-pay | Admitting: Family Medicine

## 2023-10-28 ENCOUNTER — Other Ambulatory Visit: Payer: Self-pay | Admitting: Family Medicine

## 2023-10-28 DIAGNOSIS — R7303 Prediabetes: Secondary | ICD-10-CM

## 2023-11-21 ENCOUNTER — Encounter: Payer: Self-pay | Admitting: Family Medicine

## 2023-11-21 ENCOUNTER — Ambulatory Visit: Payer: Medicare Other | Admitting: Family Medicine

## 2023-11-21 VITALS — BP 124/76 | HR 74 | Temp 98.2°F | Ht 70.0 in | Wt 208.2 lb

## 2023-11-21 DIAGNOSIS — R351 Nocturia: Secondary | ICD-10-CM | POA: Diagnosis not present

## 2023-11-21 DIAGNOSIS — N401 Enlarged prostate with lower urinary tract symptoms: Secondary | ICD-10-CM | POA: Diagnosis not present

## 2023-11-21 DIAGNOSIS — R7303 Prediabetes: Secondary | ICD-10-CM

## 2023-11-21 DIAGNOSIS — E538 Deficiency of other specified B group vitamins: Secondary | ICD-10-CM

## 2023-11-21 LAB — HEMOGLOBIN A1C: Hgb A1c MFr Bld: 6.2 % (ref 4.6–6.5)

## 2023-11-21 LAB — URINALYSIS, ROUTINE W REFLEX MICROSCOPIC
Bilirubin Urine: NEGATIVE
Hgb urine dipstick: NEGATIVE
Ketones, ur: NEGATIVE
Leukocytes,Ua: NEGATIVE
Nitrite: NEGATIVE
RBC / HPF: NONE SEEN (ref 0–?)
Specific Gravity, Urine: 1.02 (ref 1.000–1.030)
Total Protein, Urine: NEGATIVE
Urine Glucose: >=1000 — AB
Urobilinogen, UA: 0.2 (ref 0.0–1.0)
WBC, UA: NONE SEEN (ref 0–?)
pH: 6 (ref 5.0–8.0)

## 2023-11-21 LAB — PSA: PSA: 0.38 ng/mL (ref 0.10–4.00)

## 2023-11-21 LAB — VITAMIN B12: Vitamin B-12: 635 pg/mL (ref 211–911)

## 2023-11-21 MED ORDER — EMPAGLIFLOZIN 10 MG PO TABS: 10.0000 mg | ORAL_TABLET | Freq: Every day | ORAL | 2 refills | Status: DC

## 2023-11-21 NOTE — Addendum Note (Signed)
Addended by: Andrez Grime on: 11/21/2023 11:46 AM   Modules accepted: Orders

## 2023-11-21 NOTE — Progress Notes (Signed)
Established Patient Office Visit   Subjective:  Patient ID: Gerald Shelton, male    DOB: [birth year]  Age: 84 y.o. MRN: [id number]  No chief complaint on file.   HPI Encounter Diagnoses  Name Primary?   B12 deficiency Yes   Prediabetes    Benign prostatic hyperplasia with nocturia    For follow-up of above.  Continues finasteride and Flomax without issue.  Force of urine stream has improved.  Continues at 1000 mcg of B12.  Continues with empagliflozin without issue.  There has been a mild increase in frequency of urination.  He is staying active around his [place].   Review of Systems  Constitutional: Negative.   HENT: Negative.    Eyes:  Negative for blurred vision, discharge and redness.  Respiratory: Negative.    Cardiovascular: Negative.   Gastrointestinal:  Negative for abdominal pain.  Genitourinary: Negative.   Musculoskeletal: Negative.  Negative for myalgias.  Skin:  Negative for rash.  Neurological:  Negative for tingling, loss of consciousness and weakness.  Endo/Heme/Allergies:  Negative for polydipsia.     Current Outpatient Medications:    atorvastatin (LIPITOR) 20 MG tablet, TAKE 1 TABLET BY MOUTH EVERY DAY, Disp: 90 tablet, Rfl: 1   augmented betamethasone dipropionate (DIPROLENE-AF) 0.05 % cream, Apply 1 Application topically daily as needed (itching/rash.)., Disp: , Rfl:    cyanocobalamin (VITAMIN B12) 1000 MCG tablet, Take 1 tablet (1,000 mcg total) by mouth daily., Disp: 90 tablet, Rfl: 2   empagliflozin (JARDIANCE) 10 MG TABS tablet, Take 1 tablet (10 mg total) by mouth daily before breakfast., Disp: 30 tablet, Rfl: 2   finasteride (PROSCAR) 5 MG tablet, Take 1 tablet (5 mg total) by mouth daily., Disp: 30 tablet, Rfl: 5   Multiple Vitamin (MULTIVITAMIN WITH MINERALS) TABS tablet, Take 1 tablet by mouth in the morning. One A Day, Disp: , Rfl:    Olopatadine HCl (PATADAY) 0.2 % SOLN, Place 1 drop into both eyes 1 day or 1 dose., Disp: 2.5 mL, Rfl: 5   meloxicam  (MOBIC) 7.5 MG tablet, Take 1 tablet (7.5 mg total) by mouth daily., Disp: 30 tablet, Rfl: 0   Objective:     BP 124/76   Pulse 74   Temp 98.2 F (36.8 C)   Ht 5\' 10"  (1.778 m)   Wt 208 lb 3.2 oz (94.4 kg)   SpO2 94%   BMI 29.87 kg/m    Physical Exam Constitutional:      General: He is not in acute distress.    Appearance: Normal appearance. He is not ill-appearing, toxic-appearing or diaphoretic.  HENT:     Head: Normocephalic and atraumatic.     Right Ear: External ear normal.     Left Ear: External ear normal.  Eyes:     General: No scleral icterus.       Right eye: No discharge.        Left eye: No discharge.     Extraocular Movements: Extraocular movements intact.     Conjunctiva/sclera: Conjunctivae normal.  Pulmonary:     Effort: Pulmonary effort is normal. No respiratory distress.  Skin:    General: Skin is warm and dry.  Neurological:     Mental Status: He is alert and oriented to person, place, and time.  Psychiatric:        Mood and Affect: Mood normal.        Behavior: Behavior normal.      No results found for any visits on 11/21/23.  The ASCVD Risk score (Arnett DK, et al., 2019) failed to calculate for the following reasons:   The 2019 ASCVD risk score is only valid for ages 48 to 87    Assessment & Plan:   B12 deficiency -     Vitamin B12  Prediabetes -     Hemoglobin A1c -     Urinalysis, Routine w reflex microscopic  Benign prostatic hyperplasia with nocturia -     PSA -     Urinalysis, Routine w reflex microscopic    Return in about 6 months (around 05/20/2024), or May hold Flomax and then restart if needed..  Continue all medications as above.  May hold Flomax if no longer needed.  Mliss Sax, MD

## 2023-11-22 MED ORDER — EMPAGLIFLOZIN 10 MG PO TABS: 10.0000 mg | ORAL_TABLET | Freq: Every day | ORAL | 2 refills | Status: DC

## 2023-11-22 NOTE — Addendum Note (Signed)
Addended by: Mickle Plumb L on: 11/22/2023 08:00 AM   Modules accepted: Orders

## 2023-11-28 ENCOUNTER — Ambulatory Visit (INDEPENDENT_AMBULATORY_CARE_PROVIDER_SITE_OTHER): Payer: Medicare Other | Admitting: Family Medicine

## 2023-11-28 VITALS — BP 144/82 | HR 69 | Ht 70.0 in | Wt 207.0 lb

## 2023-11-28 DIAGNOSIS — M25551 Pain in right hip: Secondary | ICD-10-CM

## 2023-11-28 DIAGNOSIS — G8929 Other chronic pain: Secondary | ICD-10-CM | POA: Diagnosis not present

## 2023-11-28 NOTE — Progress Notes (Signed)
   Rubin Payor, PhD, LAT, ATC acting as a scribe for Gerald Graham, MD.  Gerald Shelton is a 83 y.o. male who presents to [facility] at [facility] today for 17-month f/u R hip pain. Pt was last seen by Dr. Denyse Amass on 09/26/23 and was referred to [facility] in [place].  Today, pt reports PT helped w/ his flexibility, but pain remains. He locates pain to the posterior aspect of the R hip. No radiating pain.  He notes occasional limping.  His symptoms wax and wane and worsened after he completed physical therapy.  Pain is located in the posterior lateral hip.  Dx imaging: 05/13/23 Bilat hip/pelvis   Pertinent review of systems: No fevers or chills  Relevant historical information: Interstitial lung disease   Exam:  BP (!) 144/82   Pulse 69   Ht 5\' 10"  (1.778 m)   Wt 207 lb (93.9 kg)   SpO2 93%   BMI 29.70 kg/m  General: Well Developed, well nourished, and in no acute distress.   MSK: Right hip normal-appearing decreased motion mild antalgic gait.    Lab and Radiology Results \ EXAM: DG HIP (WITH OR WITHOUT PELVIS) 2-3V RIGHT   COMPARISON:  None Available.   FINDINGS: No evidence of regional fracture. No joint space narrowing. No osteophyte formation. No sign of avascular necrosis. Surrounding soft tissues appear unremarkable.   IMPRESSION: Negative.     Electronically Signed   By: Paulina Fusi M.D.   On: 05/13/2023 15:46  I, Gerald Shelton, personally (independently) visualized and performed the interpretation of the images attached in this note.      Assessment and Plan: 83 y.o. male with chronic right lateral and posterior hip pain.  X-ray was unremarkable in August.  He has done a good job of physical therapy over the last 2 months.  Additionally continued home exercise program.  Pain is not well-controlled enough.  After discussion plan on MRI of the hip to evaluate source of pain and for injection or surgical planning.  Anticipate return to  clinic following MRI for discussion and possible injection.   PDMP not reviewed this encounter. Orders Placed This Encounter  Procedures   MR HIP RIGHT WO CONTRAST    Standing Status:   Future    Expiration Date:   12/26/2023    What is the patient's sedation requirement?:   No Sedation    Does the patient have a pacemaker or implanted devices?:   No    Preferred imaging location?:   [facility] (table limit-550lbs)   No orders of the defined types were placed in this encounter.    Discussed warning signs or symptoms. Please see discharge instructions. Patient expresses understanding.   The above documentation has been reviewed and is accurate and complete Gerald Shelton, M.D.

## 2023-11-28 NOTE — Patient Instructions (Addendum)
 Thank you for coming in today.   You should hear from MRI scheduling within 1 week. If you do not hear please let me know.    Let's see what the MRI says and then plan from there

## 2023-12-08 ENCOUNTER — Encounter: Payer: Self-pay | Admitting: Family Medicine

## 2023-12-08 NOTE — Telephone Encounter (Signed)
 Forwarding to Dr. Denyse Amass to review and advise.

## 2023-12-12 ENCOUNTER — Ambulatory Visit
Admission: RE | Admit: 2023-12-12 | Discharge: 2023-12-12 | Disposition: A | Discharge status: Home / Self Care | Payer: Medicare Other | Source: Ambulatory Visit | Attending: Family Medicine | Admitting: Family Medicine

## 2023-12-12 DIAGNOSIS — G8929 Other chronic pain: Secondary | ICD-10-CM

## 2023-12-21 ENCOUNTER — Encounter: Payer: Self-pay | Admitting: Family Medicine

## 2023-12-21 NOTE — Progress Notes (Signed)
 Right hip MRI shows mild tendinitis of the right hamstring.  Oddly the left hamstring looks a little worse.  Would recommend considering injection into the hamstring origin where you are experiencing pain.  You do have some mild arthritis as well which typically will produce pain in the front of the hip but sometimes can hurt in the back of the hip as well.  Recommend return to clinic to talk about results in full detail and likely proceed with an injection.

## 2024-01-02 ENCOUNTER — Ambulatory Visit (INDEPENDENT_AMBULATORY_CARE_PROVIDER_SITE_OTHER): Admitting: Family Medicine

## 2024-01-02 VITALS — BP 142/82 | HR 62 | Ht 70.0 in | Wt 201.0 lb

## 2024-01-02 DIAGNOSIS — G8929 Other chronic pain: Secondary | ICD-10-CM | POA: Diagnosis not present

## 2024-01-02 DIAGNOSIS — M25551 Pain in right hip: Secondary | ICD-10-CM

## 2024-01-02 NOTE — Patient Instructions (Signed)
 Thank you for coming in today.   Glad you are feeling better.  Let me know if you want to try a shot in the future.  Keep working on your home exercises  Check back as needed

## 2024-01-02 NOTE — Progress Notes (Signed)
   Rubin Payor, PhD, LAT, ATC acting as a scribe for [name], MD.  Gerald Shelton is a 83 y.o. male who presents to [facility] at [facility] today for f/u R hip pain w/ MRI review. Pt was last seen by Dr. Denyse Amass on 11/28/23 and a MRI was ordered.  Today, pt reports R hip is feeling pretty good. R hip is the best its been in the past 74-months.  Dx imaging: 12/12/23 R hip MRI 05/13/23 Bilat hip/pelvis   Pertinent review of systems: No fevers or chills yes  Relevant historical information: Generalized anxiety   Exam:  BP (!) 142/82   Pulse 62   Ht 5\' 10"  (1.778 m)   Wt 201 lb (91.2 kg)   SpO2 92%   BMI 28.84 kg/m  General: Well Developed, well nourished, and in no acute distress.   MSK: Right hip normal-appearing normal motion nontender.    Lab and Radiology Results  EXAM: MR OF THE RIGHT HIP WITHOUT CONTRAST   TECHNIQUE: Multiplanar, multisequence MR imaging was performed. No intravenous contrast was administered.   COMPARISON:  None Available.   FINDINGS: Bones:   No hip fracture, dislocation or avascular necrosis.   No periosteal reaction or bone destruction. No aggressive osseous lesion.   Normal sacrum and sacroiliac joints. No SI joint widening or erosive changes.   Articular cartilage and labrum   Articular cartilage: Mild partial-thickness cartilage loss of the femoral head and acetabulum bilaterally.   Labrum:  Right anterosuperior labral tear.   Joint or bursal effusion   Joint effusion:  No hip joint effusion.  No SI joint effusion.   Bursae:  No bursal fluid.   Muscles and tendons   Flexors: Normal.   Extensors: Normal.   Abductors: Normal.   Adductors: Normal.   Gluteals: Normal.   Hamstrings: Mild tendinosis of the right hamstring origin. Moderate tendinosis of the left hamstring origin with mild subcortical marrow edema.   Other findings   No pelvic free fluid. No fluid collection or hematoma. No  inguinal lymphadenopathy. No inguinal hernia.   IMPRESSION: 1. Mild osteoarthritis of bilateral hips. 2. Mild tendinosis of the right hamstring origin. 3. Moderate tendinosis of the left hamstring origin with mild subcortical marrow edema. 4. Right anterosuperior labral tear.     Electronically Signed   By: Elige Ko M.D.   On: 12/20/2023 14:52 I, [name], personally (independently) visualized and performed the interpretation of the images attached in this note.     Assessment and Plan: 83 y.o. male with resolving right posterior hip pain.  MRI did come back showing some tendinitis at the right hip hamstring area and some arthritis and a labrum tear in the right anterior hip.  Somewhat unexpectedly his pain has improved quite a lot.  Plan to continue PT exercises.  Consider steroid injection if not improving.  For now watchful waiting.  Reviewed MRI images and report Dr. About potential next steps. Total encounter time 20 minutes including face-to-face time with the patient and, reviewing past medical record, and charting on the date of service.     PDMP not reviewed this encounter. No orders of the defined types were placed in this encounter.  No orders of the defined types were placed in this encounter.    Discussed warning signs or symptoms. Please see discharge instructions. Patient expresses understanding.   The above documentation has been reviewed and is accurate and complete [name], M.D.

## 2024-01-24 ENCOUNTER — Other Ambulatory Visit: Payer: Self-pay | Admitting: Family Medicine

## 2024-01-24 DIAGNOSIS — N401 Enlarged prostate with lower urinary tract symptoms: Secondary | ICD-10-CM

## 2024-02-04 ENCOUNTER — Other Ambulatory Visit: Payer: Self-pay | Admitting: Family Medicine

## 2024-02-04 DIAGNOSIS — E782 Mixed hyperlipidemia: Secondary | ICD-10-CM

## 2024-02-05 ENCOUNTER — Other Ambulatory Visit: Payer: Self-pay | Admitting: Family Medicine

## 2024-02-05 DIAGNOSIS — N401 Enlarged prostate with lower urinary tract symptoms: Secondary | ICD-10-CM

## 2024-05-21 ENCOUNTER — Ambulatory Visit: Payer: Self-pay | Admitting: Family Medicine

## 2024-05-21 ENCOUNTER — Ambulatory Visit (INDEPENDENT_AMBULATORY_CARE_PROVIDER_SITE_OTHER): Payer: Medicare Other | Admitting: Family Medicine

## 2024-05-21 ENCOUNTER — Encounter: Payer: Self-pay | Admitting: Family Medicine

## 2024-05-21 VITALS — BP 118/74 | HR 57 | Temp 97.3°F | Ht 70.0 in | Wt 203.0 lb

## 2024-05-21 DIAGNOSIS — R7303 Prediabetes: Secondary | ICD-10-CM

## 2024-05-21 DIAGNOSIS — E782 Mixed hyperlipidemia: Secondary | ICD-10-CM | POA: Diagnosis not present

## 2024-05-21 DIAGNOSIS — E538 Deficiency of other specified B group vitamins: Secondary | ICD-10-CM | POA: Diagnosis not present

## 2024-05-21 DIAGNOSIS — N401 Enlarged prostate with lower urinary tract symptoms: Secondary | ICD-10-CM

## 2024-05-21 DIAGNOSIS — R351 Nocturia: Secondary | ICD-10-CM

## 2024-05-21 LAB — LIPID PANEL
Cholesterol: 122 mg/dL (ref 0–200)
HDL: 46 mg/dL (ref >39.00)
LDL Cholesterol: 32 mg/dL (ref 0–99)
NonHDL: 75.61
Total CHOL/HDL Ratio: 3
Triglycerides: 217 mg/dL — ABNORMAL HIGH (ref 0.0–149.0)
VLDL: 43.4 mg/dL — ABNORMAL HIGH (ref 0.0–40.0)

## 2024-05-21 LAB — CBC
HCT: 45.3 % (ref 39.0–52.0)
Hemoglobin: 15 g/dL (ref 13.0–17.0)
MCHC: 33 g/dL (ref 30.0–36.0)
MCV: 91.2 fl (ref 78.0–100.0)
Platelets: 224 K/uL (ref 150.0–400.0)
RBC: 4.97 Mil/uL (ref 4.22–5.81)
RDW: 13.7 % (ref 11.5–15.5)
WBC: 7.3 K/uL (ref 4.0–10.5)

## 2024-05-21 LAB — COMPREHENSIVE METABOLIC PANEL WITH GFR
ALT: 18 U/L (ref 0–53)
AST: 18 U/L (ref 0–37)
Albumin: 3.8 g/dL (ref 3.5–5.2)
Alkaline Phosphatase: 74 U/L (ref 39–117)
BUN: 16 mg/dL (ref 6–23)
CO2: 31 meq/L (ref 19–32)
Calcium: 8.9 mg/dL (ref 8.4–10.5)
Chloride: 104 meq/L (ref 96–112)
Creatinine, Ser: 0.86 mg/dL (ref 0.40–1.50)
GFR: 81.33 mL/min (ref >60.00)
Glucose, Bld: 116 mg/dL — ABNORMAL HIGH (ref 70–99)
Potassium: 4.5 meq/L (ref 3.5–5.1)
Sodium: 141 meq/L (ref 135–145)
Total Bilirubin: 0.4 mg/dL (ref 0.2–1.2)
Total Protein: 6.4 g/dL (ref 6.0–8.3)

## 2024-05-21 LAB — HEMOGLOBIN A1C: Hgb A1c MFr Bld: 6.4 % (ref 4.6–6.5)

## 2024-05-21 LAB — PSA: PSA: 0.29 ng/mL (ref 0.10–4.00)

## 2024-05-21 MED ORDER — EMPAGLIFLOZIN 25 MG PO TABS: 25.0000 mg | ORAL_TABLET | Freq: Every day | ORAL | 1 refills | Status: DC

## 2024-05-21 MED ORDER — TAMSULOSIN HCL 0.4 MG PO CAPS: 0.4000 mg | ORAL_CAPSULE | Freq: Every day | ORAL | 5 refills | Status: DC

## 2024-05-21 NOTE — Addendum Note (Signed)
 Addended by: BERNETA ELSIE LABOR on: 05/21/2024 03:57 PM   Modules accepted: Orders

## 2024-05-21 NOTE — Progress Notes (Addendum)
 Established Patient Office Visit   Subjective:  Patient ID: Gerald Shelton, male    DOB: Jul 13, 1941  Age: 83 y.o. MRN: [id number]  Chief Complaint  Patient presents with   Medical Management of Chronic Issues    6 month follow up. Pt is not fasting. No concerns.     HPI Encounter Diagnoses  Name Primary?   Prediabetes Yes   Benign prostatic hyperplasia with nocturia    Mixed hyperlipidemia    B12 deficiency    Follow-up of above.  Continues with Jardiance  for prediabetes.  Finasteride  has improved urine flow.  There is still nocturia with it.  He has difficulty fluid restricting prior to bedtime secondary to nocturnal cramps.  Continues atorvastatin  20 for elevated cholesterol.  He is fasting today.  Continue cyanocobalamin  for B12 deficiency   Review of Systems  Constitutional: Negative.   HENT: Negative.    Eyes:  Negative for blurred vision, discharge and redness.  Respiratory: Negative.    Cardiovascular: Negative.   Gastrointestinal:  Negative for abdominal pain.  Genitourinary: Negative.   Musculoskeletal: Negative.  Negative for myalgias.  Skin:  Negative for rash.  Neurological:  Negative for tingling, loss of consciousness and weakness.  Endo/Heme/Allergies:  Negative for polydipsia.     Current Outpatient Medications:    atorvastatin  (LIPITOR) 20 MG tablet, TAKE 1 TABLET BY MOUTH EVERY DAY, Disp: 90 tablet, Rfl: 1   augmented betamethasone dipropionate (DIPROLENE-AF) 0.05 % cream, Apply 1 Application topically daily as needed (itching/rash.)., Disp: , Rfl:    cyanocobalamin  (VITAMIN B12) 1000 MCG tablet, Take 1 tablet (1,000 mcg total) by mouth daily., Disp: 90 tablet, Rfl: 2   empagliflozin  (JARDIANCE ) 25 MG TABS tablet, Take 1 tablet (25 mg total) by mouth daily before breakfast., Disp: 90 tablet, Rfl: 1   Multiple Vitamin (MULTIVITAMIN WITH MINERALS) TABS tablet, Take 1 tablet by mouth in the morning. One A Day, Disp: , Rfl:    Olopatadine  HCl (PATADAY ) 0.2 %  SOLN, Place 1 drop into both eyes 1 day or 1 dose., Disp: 2.5 mL, Rfl: 5   tamsulosin  (FLOMAX ) 0.4 MG CAPS capsule, Take 1 capsule (0.4 mg total) by mouth daily., Disp: 30 capsule, Rfl: 5   finasteride  (PROSCAR ) 5 MG tablet, TAKE 1 TABLET (5 MG TOTAL) BY MOUTH DAILY., Disp: 90 tablet, Rfl: 1   Objective:     BP 118/74 (BP Location: Right Arm, Patient Position: Sitting, Cuff Size: Normal)   Pulse (!) 57   Temp (!) 97.3 F (36.3 C) (Temporal)   Ht 5' 10 (1.778 m)   Wt 203 lb (92.1 kg)   SpO2 97%   BMI 29.13 kg/m    Physical Exam Constitutional:      General: He is not in acute distress.    Appearance: Normal appearance. He is not ill-appearing, toxic-appearing or diaphoretic.  HENT:     Head: Normocephalic and atraumatic.     Right Ear: External ear normal.     Left Ear: External ear normal.  Eyes:     General: No scleral icterus.       Right eye: No discharge.        Left eye: No discharge.     Extraocular Movements: Extraocular movements intact.     Conjunctiva/sclera: Conjunctivae normal.  Pulmonary:     Effort: Pulmonary effort is normal. No respiratory distress.  Skin:    General: Skin is warm and dry.  Neurological:     Mental Status: He is alert and oriented to  person, place, and time.  Psychiatric:        Mood and Affect: Mood normal.        Behavior: Behavior normal.      Results for orders placed or performed in visit on 05/21/24  Lipid panel  Result Value Ref Range   Cholesterol 122 0 - 200 mg/dL   Triglycerides 782.9 (H) 0.0 - 149.0 mg/dL   HDL 53.99 >60.99 mg/dL   VLDL 56.5 (H) 0.0 - 59.9 mg/dL   LDL Cholesterol 32 0 - 99 mg/dL   Total CHOL/HDL Ratio 3    NonHDL 75.61   CBC  Result Value Ref Range   WBC 7.3 4.0 - 10.5 K/uL   RBC 4.97 4.22 - 5.81 Mil/uL   Platelets 224.0 150.0 - 400.0 K/uL   Hemoglobin 15.0 13.0 - 17.0 g/dL   HCT 54.6 60.9 - 47.9 %   MCV 91.2 78.0 - 100.0 fl   MCHC 33.0 30.0 - 36.0 g/dL   RDW 86.2 88.4 - 84.4 %   Comprehensive metabolic panel with GFR  Result Value Ref Range   Sodium 141 135 - 145 mEq/L   Potassium 4.5 3.5 - 5.1 mEq/L   Chloride 104 96 - 112 mEq/L   CO2 31 19 - 32 mEq/L   Glucose, Bld 116 (H) 70 - 99 mg/dL   BUN 16 6 - 23 mg/dL   Creatinine, Ser 9.13 0.40 - 1.50 mg/dL   Total Bilirubin 0.4 0.2 - 1.2 mg/dL   Alkaline Phosphatase 74 39 - 117 U/L   AST 18 0 - 37 U/L   ALT 18 0 - 53 U/L   Total Protein 6.4 6.0 - 8.3 g/dL   Albumin 3.8 3.5 - 5.2 g/dL   GFR 18.66 >39.99 mL/min   Calcium  8.9 8.4 - 10.5 mg/dL  Hemoglobin J8r  Result Value Ref Range   Hgb A1c MFr Bld 6.4 4.6 - 6.5 %  PSA  Result Value Ref Range   PSA 0.29 0.10 - 4.00 ng/mL      The ASCVD Risk score (Arnett DK, et al., 2019) failed to calculate for the following reasons:   The 2019 ASCVD risk score is only valid for ages 57 to 62    Assessment & Plan:   Prediabetes -     Comprehensive metabolic panel with GFR -     Hemoglobin A1c -     Empagliflozin ; Take 1 tablet (25 mg total) by mouth daily before breakfast.  Dispense: 90 tablet; Refill: 1  Benign prostatic hyperplasia with nocturia -     PSA -     Tamsulosin  HCl; Take 1 capsule (0.4 mg total) by mouth daily.  Dispense: 30 capsule; Refill: 5  Mixed hyperlipidemia -     Lipid panel  B12 deficiency -     CBC    Return in about 3 months (around 08/21/2024), or Will return fasting for blood work..  Have added Flomax  to possibly help with nocturia.  Would have to consider contribution to nocturia with Jardiance .  Elsie Sim Lent, MD  8/12 addendum: Have increased Jardiance  to 25 mg daily.  Follow-up in 3 months instead of 6.

## 2024-05-30 ENCOUNTER — Encounter: Payer: Self-pay | Admitting: Emergency Medicine

## 2024-05-30 ENCOUNTER — Ambulatory Visit: Admission: EM | Admit: 2024-05-30 | Discharge: 2024-05-30 | Disposition: A | Discharge status: Home / Self Care

## 2024-05-30 DIAGNOSIS — H6123 Impacted cerumen, bilateral: Secondary | ICD-10-CM | POA: Diagnosis not present

## 2024-05-30 MED ORDER — CARBAMIDE PEROXIDE 6.5 % OT SOLN: 5.0000 [drp] | Freq: Two times a day (BID) | OTIC | 0 refills | Status: DC

## 2024-05-30 NOTE — Discharge Instructions (Addendum)
  1. Bilateral impacted cerumen (Primary) - Ear wax removal performed by UC provider and RN with minimal improvement to symptoms.  No significant secondary infection noted on reexamination - Ambulatory referral to ENT for follow-up evaluation and treatment of cerumen impaction. - carbamide peroxide (DEBROX) 6.5 % OTIC solution; Place 5 drops into both ears 2 (two) times daily.  Dispense: 15 mL; Refill: 0 - Use Debrox drops daily as directed and flush ears with warm water while showering to hopefully loosen and remove impacted cerumen. -Continue to monitor symptoms for any change in severity if there is any escalation of current symptoms or development of new symptoms follow-up in ER for further evaluation and management.

## 2024-05-30 NOTE — ED Provider Notes (Signed)
 [facility]  Note:  This document was prepared using Dragon voice recognition software and may include unintentional dictation errors.  MRN: [id number] DOB: July 11, 1941  Subjective:   Gerald Shelton is a 83 y.o. male presenting for right ear clogged, denies any pain or drainage from ear but feels like there is a sensation of fullness in the right ear.  Patient is having decreased hearing.  Patient denies any symptoms to the left ear.  Patient does wear hearing aids but has not had any previous issues with cerumen impaction.  Denies any other medical concerns at this time  No current facility-administered medications for this encounter.  Current Outpatient Medications:    carbamide peroxide (DEBROX) 6.5 % OTIC solution, Place 5 drops into both ears 2 (two) times daily., Disp: 15 mL, Rfl: 0   atorvastatin  (LIPITOR) 20 MG tablet, TAKE 1 TABLET BY MOUTH EVERY DAY, Disp: 90 tablet, Rfl: 1   augmented betamethasone dipropionate (DIPROLENE-AF) 0.05 % cream, Apply 1 Application topically daily as needed (itching/rash.)., Disp: , Rfl:    cyanocobalamin  (VITAMIN B12) 1000 MCG tablet, Take 1 tablet (1,000 mcg total) by mouth daily., Disp: 90 tablet, Rfl: 2   empagliflozin  (JARDIANCE ) 25 MG TABS tablet, Take 1 tablet (25 mg total) by mouth daily before breakfast., Disp: 90 tablet, Rfl: 1   finasteride  (PROSCAR ) 5 MG tablet, TAKE 1 TABLET (5 MG TOTAL) BY MOUTH DAILY., Disp: 90 tablet, Rfl: 1   Multiple Vitamin (MULTIVITAMIN WITH MINERALS) TABS tablet, Take 1 tablet by mouth in the morning. One A Day, Disp: , Rfl:    Olopatadine  HCl (PATADAY ) 0.2 % SOLN, Place 1 drop into both eyes 1 day or 1 dose., Disp: 2.5 mL, Rfl: 5   tamsulosin  (FLOMAX ) 0.4 MG CAPS capsule, Take 1 capsule (0.4 mg total) by mouth daily., Disp: 30 capsule, Rfl: 5   Allergies  Allergen Reactions   Augmentin [Amoxicillin-Pot Clavulanate] Other (See Comments)    GI Upset   Metformin  And Related Other (See Comments)     Cramps with loose stool    Zocor [Simvastatin] Other (See Comments)    GI Upset    Past Medical History:  Diagnosis Date   A-fib (HCC) 10/25/2016   per pt not afib PAC's   Anxiety attack    Atrial premature complexes    Cancer (HCC)    SKIN, ON FACE   Chest pain 10/25/2016   ED (erectile dysfunction)    GERD (gastroesophageal reflux disease)    Hay fever    Hyperlipidemia    Irregular heartbeat    PUD (peptic ulcer disease)    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     Past Surgical History:  Procedure Laterality Date   BRONCHIAL WASHINGS  07/28/2022   Procedure: BRONCHIAL WASHINGS;  Surgeon: Annella Donnice SAUNDERS, MD;  Location: [facility];  Service: Endoscopy;;   EYE SURGERY     cataract removed form right eye   HERNIA REPAIR     x2   NASAL SEPTUM SURGERY     VIDEO BRONCHOSCOPY N/A 07/28/2022   Procedure: VIDEO BRONCHOSCOPY WITHOUT FLUORO;  Surgeon: Annella Donnice SAUNDERS, MD;  Location: [facility];  Service: Endoscopy;  Laterality: N/A;    Family History  Problem Relation Age of Onset   Diabetes Son    Parkinson's disease Mother    Depression Mother    Dementia Mother     Social History   Tobacco Use   Smoking status: Former    Current packs/day: 0.00  Types: Cigarettes    Quit date: 10/25/1998    Years since quitting: 25.6    Passive exposure: Never   Smokeless tobacco: Never  Vaping Use   Vaping status: Never Used  Substance Use Topics   Alcohol use: Yes    Comment: RARE - glass of wine   Drug use: No    ROS Refer to HPI for ROS details.  Objective:    Vitals: BP 135/85 (BP Location: Right Arm)   Pulse 73   Temp 98.1 F (36.7 C) (Oral)   Resp 16   SpO2 91%   Physical Exam Vitals and nursing note reviewed.  Constitutional:      General: He is not in acute distress.    Appearance: He is well-developed. He is not ill-appearing or toxic-appearing.  HENT:     Head: Normocephalic.     Right Ear: Ear canal and external  ear normal. There is impacted cerumen.     Left Ear: Ear canal and external ear normal. There is impacted cerumen.     Nose: Nose normal.     Mouth/Throat:     Mouth: Mucous membranes are moist.  Eyes:     Extraocular Movements: Extraocular movements intact.     Conjunctiva/sclera: Conjunctivae normal.  Cardiovascular:     Rate and Rhythm: Normal rate.  Pulmonary:     Effort: Pulmonary effort is normal. No respiratory distress.  Skin:    General: Skin is warm and dry.  Neurological:     General: No focal deficit present.     Mental Status: He is alert and oriented to person, place, and time.  Psychiatric:        Mood and Affect: Mood normal.        Behavior: Behavior normal.     Procedures  No results found for this or any previous visit (from the past 24 hours).  Assessment and Plan :     Discharge Instructions       1. Bilateral impacted cerumen (Primary) - Ear wax removal performed by UC provider and RN with minimal improvement to symptoms.  No significant secondary infection noted on reexamination - Ambulatory referral to ENT for follow-up evaluation and treatment of cerumen impaction. - carbamide peroxide (DEBROX) 6.5 % OTIC solution; Place 5 drops into both ears 2 (two) times daily.  Dispense: 15 mL; Refill: 0 - Use Debrox drops daily as directed and flush ears with warm water while showering to hopefully loosen and remove impacted cerumen. -Continue to monitor symptoms for any change in severity if there is any escalation of current symptoms or development of new symptoms follow-up in ER for further evaluation and management.      Tamar Lipscomb B Malakhai Beitler   Tarrell Debes, [place] B, [place] 05/30/24 1256

## 2024-05-30 NOTE — ED Triage Notes (Signed)
 Pt c/o of right ear being full and feeling plugged for a few days. States he tried to wash it out at home

## 2024-06-17 ENCOUNTER — Emergency Department (HOSPITAL_COMMUNITY)

## 2024-06-17 ENCOUNTER — Encounter: Payer: Self-pay | Admitting: Emergency Medicine

## 2024-06-17 ENCOUNTER — Ambulatory Visit: Admission: EM | Admit: 2024-06-17 | Discharge: 2024-06-17 | Disposition: A | Discharge status: Home / Self Care

## 2024-06-17 ENCOUNTER — Observation Stay (HOSPITAL_COMMUNITY)
Admission: EM | Admit: 2024-06-17 | Discharge: 2024-06-18 | Disposition: A | Discharge status: Home / Self Care | Source: Ambulatory Visit | Attending: Internal Medicine | Admitting: Internal Medicine

## 2024-06-17 DIAGNOSIS — J849 Interstitial pulmonary disease, unspecified: Secondary | ICD-10-CM | POA: Diagnosis present

## 2024-06-17 DIAGNOSIS — H539 Unspecified visual disturbance: Secondary | ICD-10-CM

## 2024-06-17 DIAGNOSIS — J439 Emphysema, unspecified: Secondary | ICD-10-CM | POA: Diagnosis not present

## 2024-06-17 DIAGNOSIS — I700 Atherosclerosis of aorta: Secondary | ICD-10-CM | POA: Diagnosis not present

## 2024-06-17 DIAGNOSIS — I6523 Occlusion and stenosis of bilateral carotid arteries: Secondary | ICD-10-CM | POA: Insufficient documentation

## 2024-06-17 DIAGNOSIS — G459 Transient cerebral ischemic attack, unspecified: Secondary | ICD-10-CM | POA: Diagnosis present

## 2024-06-17 DIAGNOSIS — E782 Mixed hyperlipidemia: Secondary | ICD-10-CM | POA: Insufficient documentation

## 2024-06-17 DIAGNOSIS — Z7982 Long term (current) use of aspirin: Secondary | ICD-10-CM | POA: Insufficient documentation

## 2024-06-17 DIAGNOSIS — Z79899 Other long term (current) drug therapy: Secondary | ICD-10-CM | POA: Diagnosis not present

## 2024-06-17 DIAGNOSIS — H53122 Transient visual loss, left eye: Secondary | ICD-10-CM | POA: Diagnosis not present

## 2024-06-17 DIAGNOSIS — H510 Palsy (spasm) of conjugate gaze: Principal | ICD-10-CM

## 2024-06-17 DIAGNOSIS — F10920 Alcohol use, unspecified with intoxication, uncomplicated: Secondary | ICD-10-CM | POA: Insufficient documentation

## 2024-06-17 DIAGNOSIS — N400 Enlarged prostate without lower urinary tract symptoms: Secondary | ICD-10-CM | POA: Insufficient documentation

## 2024-06-17 DIAGNOSIS — E785 Hyperlipidemia, unspecified: Secondary | ICD-10-CM | POA: Insufficient documentation

## 2024-06-17 DIAGNOSIS — R42 Dizziness and giddiness: Secondary | ICD-10-CM

## 2024-06-17 LAB — CBC
HCT: 49.1 % (ref 39.0–52.0)
Hemoglobin: 15.4 g/dL (ref 13.0–17.0)
MCH: 30.1 pg (ref 26.0–34.0)
MCHC: 31.4 g/dL (ref 30.0–36.0)
MCV: 96.1 fL (ref 80.0–100.0)
Platelets: 214 K/uL (ref 150–400)
RBC: 5.11 MIL/uL (ref 4.22–5.81)
RDW: 13.2 % (ref 11.5–15.5)
WBC: 9.9 K/uL (ref 4.0–10.5)
nRBC: 0 % (ref 0.0–0.2)

## 2024-06-17 LAB — URINE DRUG SCREEN
Amphetamines: NEGATIVE
Barbiturates: NEGATIVE
Benzodiazepines: NEGATIVE
Cocaine: NEGATIVE
Fentanyl: NEGATIVE
Methadone Scn, Ur: NEGATIVE
Opiates: NEGATIVE
Tetrahydrocannabinol: NEGATIVE

## 2024-06-17 LAB — DIFFERENTIAL
Abs Immature Granulocytes: 0.06 10*3/uL (ref 0.00–0.07)
Basophils Absolute: 0.1 10*3/uL (ref 0.0–0.1)
Basophils Relative: 1 %
Eosinophils Absolute: 0.3 10*3/uL (ref 0.0–0.5)
Eosinophils Relative: 3 %
Immature Granulocytes: 1 %
Lymphocytes Relative: 11 %
Lymphs Abs: 1.1 10*3/uL (ref 0.7–4.0)
Monocytes Absolute: 0.6 10*3/uL (ref 0.1–1.0)
Monocytes Relative: 6 %
Neutro Abs: 7.8 10*3/uL — ABNORMAL HIGH (ref 1.7–7.7)
Neutrophils Relative %: 78 %

## 2024-06-17 LAB — COMPREHENSIVE METABOLIC PANEL WITH GFR
ALT: 20 U/L (ref 0–44)
AST: 26 U/L (ref 15–41)
Albumin: 4.2 g/dL (ref 3.5–5.0)
Alkaline Phosphatase: 94 U/L (ref 38–126)
Anion gap: 11 (ref 5–15)
BUN: 18 mg/dL (ref 8–23)
CO2: 26 mmol/L (ref 22–32)
Calcium: 9.4 mg/dL (ref 8.9–10.3)
Chloride: 106 mmol/L (ref 98–111)
Creatinine, Ser: 0.95 mg/dL (ref 0.61–1.24)
GFR, Estimated: >60 mL/min
Glucose, Bld: 117 mg/dL — ABNORMAL HIGH (ref 70–99)
Potassium: 4.2 mmol/L (ref 3.5–5.1)
Sodium: 143 mmol/L (ref 135–145)
Total Bilirubin: 0.3 mg/dL (ref 0.0–1.2)
Total Protein: 6.9 g/dL (ref 6.5–8.1)

## 2024-06-17 LAB — PROTIME-INR
INR: 0.9 (ref 0.8–1.2)
Prothrombin Time: 12.8 s (ref 11.4–15.2)

## 2024-06-17 LAB — ETHANOL: Alcohol, Ethyl (B): <15 mg/dL

## 2024-06-17 MED ORDER — IOHEXOL 350 MG/ML SOLN
75.0000 mL | Freq: Once | INTRAVENOUS | Status: AC | PRN
  Administered 2024-06-17: 75 mL via INTRAVENOUS

## 2024-06-17 NOTE — ED Provider Notes (Signed)
 Patient presents to urgent care with acute onset of visual disturbances and dizziness that started about an hour and a half ago.  He reports symptoms lasted about 45 minutes and have now resolved.  He states he does feel a little unsettled standing in place.  He has had some nausea.  He denies headache.  Strongly recommended further evaluation in the emergency room.  Patient is agreeable to same and wife will transport via POV.   Billy Asberry FALCON, PA-C 06/17/24 [phone]

## 2024-06-17 NOTE — ED Triage Notes (Addendum)
 Pt reports sudden onset of visual disturbances and vertigo about 1.5hrs ago. He was just watching television and suddenly could not focus on it. His wife is a Engineer, civil (consulting) and reported that his L eye would not track when doing an H motility test at home. Both eyes track normally now at [facility]. Pt reports it felt like he was having double vision. Looking over his L shoulder, he saw normally, but looking over to the right it was like I was seeing two worlds. Vertigo onset at the same time. These episodes lasted total. Vision has returned to normal and pt reports not feeling dizzy but a little unsettled standing in place. Pt reports nausea has been ongoing since these episodes. No emesis episodes. Denies headache.   Facial expression is even. Denies weakness or numbness. No slurred speech or difficulty speaking.

## 2024-06-17 NOTE — ED Provider Notes (Signed)
  Physical Exam  BP 134/69   Pulse (!) 59   Temp 97.9 F (36.6 C) (Oral)   Resp 19   SpO2 94%   Physical Exam  Procedures  Procedures  ED Course / MDM    Medical Decision Making Amount and/or Complexity of Data Reviewed Labs: ordered. Radiology: ordered.  Risk Decision regarding hospitalization.   Patient care assumed at shift handoff with plans for hospitalist admission to [facility] for TIA workup.  CT angio head neck and MRI brain ordered.  I discussed the case with Dr. Charlton who agrees to see the patient for admission.       Gerald Shelton 06/17/24 2259    Trine Raynell Moder, MD 06/18/24 [phone]

## 2024-06-17 NOTE — ED Notes (Signed)
 Patient is being discharged from the Urgent Care and sent to the Emergency Department via POV . Per Billy, PA-C, patient is in need of higher level of care due to need for further evaluation of sudden symptoms and for potential CT. Patient is aware and verbalizes understanding of plan of care.  Vitals:   06/17/24 1806  BP: (!) 148/81  Pulse: 67  Resp: 18  Temp: 97.8 F (36.6 C)  SpO2: 94%

## 2024-06-17 NOTE — ED Triage Notes (Signed)
 Patient states around 4pm he had visual disturbance. He states he was watching TV  and suddenly could not focus and has issues with his left eye. Patients wife states patient could not tract with his left eye. Both eyes track normally now. Denies any visual issues at this time and states everything is back to normal. Patient complains of vertigo. Denies any dizziness but unsteady on his feet. Denies headache or pain. Seen at Urgent Care and was sent to ED for eval for possible stroke symptoms.

## 2024-06-17 NOTE — ED Provider Notes (Signed)
 [place] EMERGENCY DEPARTMENT AT [facility] Provider Note   CSN: [id number] Arrival date & time: 06/17/24  8161     Patient presents with: No chief complaint on file.   Gerald Shelton is a 83 y.o. male.  83 year old male presents to the ED with concerns of visual disturbances at approximately 4 PM today.  Patient reports he was watching TV when he could no longer track with his left eye.  He reports his wife was a Engineer, civil (consulting) and his EOM was slightly decreased on the left eye.  This lasted for approximately 45 minutes and subsided.  He was seen in urgent care and they told him to come to ED for further evaluation of stroke.  Patient has no history of stroke and no cardiac history.  Patient reports he has no current symptoms and did not have any weakness, gait abnormalities, dizziness, chest pain, shortness of breath.     Prior to Admission medications   Medication Sig Start Date End Date Taking? Authorizing Provider  atorvastatin  (LIPITOR) 20 MG tablet TAKE 1 TABLET BY MOUTH EVERY DAY 02/05/24   Berneta Elsie Sayre, MD  augmented betamethasone dipropionate (DIPROLENE-AF) 0.05 % cream Apply 1 Application topically daily as needed (itching/rash.). 03/15/22   [provider]  carbamide peroxide (DEBROX) 6.5 % OTIC solution Place 5 drops into both ears 2 (two) times daily. 05/30/24   Reddick, Johnathan B, NP  cyanocobalamin  (VITAMIN B12) 1000 MCG tablet Take 1 tablet (1,000 mcg total) by mouth daily. 05/05/23   Berneta Elsie Sayre, MD  empagliflozin  (JARDIANCE ) 25 MG TABS tablet Take 1 tablet (25 mg total) by mouth daily before breakfast. 05/21/24   Berneta Elsie Sayre, MD  finasteride  (PROSCAR ) 5 MG tablet TAKE 1 TABLET (5 MG TOTAL) BY MOUTH DAILY. 02/06/24   Berneta Elsie Sayre, MD  Multiple Vitamin (MULTIVITAMIN WITH MINERALS) TABS tablet Take 1 tablet by mouth in the morning. One A Day    [provider]  Olopatadine  HCl (PATADAY ) 0.2 % SOLN Place 1 drop into both  eyes 1 day or 1 dose. 05/10/23   Lorin Norris, MD  omeprazole (PRILOSEC) 20 MG capsule Oral 05/01/17   [provider]  propranolol  (INDERAL ) 20 MG tablet Oral 05/01/17   [provider]  tamsulosin  (FLOMAX ) 0.4 MG CAPS capsule Take 1 capsule (0.4 mg total) by mouth daily. 05/21/24   Berneta Elsie Sayre, MD  TOLAK 4 % CREA Apply topically daily. 12/28/23   [provider]    Allergies: Augmentin [amoxicillin-pot clavulanate], Metformin  and related, and Zocor [simvastatin]    Review of Systems  Eyes:  Positive for visual disturbance.  All other systems reviewed and are negative.   Updated Vital Signs BP 134/69   Pulse (!) 59   Temp 97.9 F (36.6 C) (Oral)   Resp 19   SpO2 94%   Physical Exam Vitals and nursing note reviewed.  Constitutional:      Appearance: Normal appearance.  HENT:     Head: Normocephalic and atraumatic.     Nose: Nose normal.  Eyes:     Extraocular Movements: Extraocular movements intact.     Conjunctiva/sclera: Conjunctivae normal.     Pupils: Pupils are equal, round, and reactive to light.  Cardiovascular:     Rate and Rhythm: Normal rate.  Pulmonary:     Effort: Pulmonary effort is normal. No respiratory distress.  Musculoskeletal:        General: Normal range of motion.     Cervical back: Normal  range of motion.  Neurological:     General: No focal deficit present.     Mental Status: He is alert.     Cranial Nerves: No cranial nerve deficit.     Sensory: No sensory deficit.     Motor: No weakness.     Coordination: Coordination normal.     Gait: Gait normal.  Psychiatric:        Mood and Affect: Mood normal.        Behavior: Behavior normal.     (all labs ordered are listed, but only abnormal results are displayed) Labs Reviewed  DIFFERENTIAL - Abnormal; Notable for the following components:      Result Value   Neutro Abs 7.8 (*)    All other components within normal limits  COMPREHENSIVE METABOLIC PANEL  WITH GFR - Abnormal; Notable for the following components:   Glucose, Bld 117 (*)    All other components within normal limits  PROTIME-INR  CBC  ETHANOL  URINE DRUG SCREEN    EKG: EKG Interpretation Date/Time:  Monday June 17 2024 19:28:16 EDT Ventricular Rate:  66 PR Interval:  160 QRS Duration:  101 QT Interval:  430 QTC Calculation: 451 R Axis:   162  Text Interpretation: Sinus rhythm Atrial premature complex Probable right ventricular hypertrophy Confirmed by Melvenia Motto [phone]) on 06/17/2024 9:28:45 PM  Radiology: CT HEAD WO CONTRAST Result Date: 06/17/2024 EXAM: CT HEAD WITHOUT CONTRAST 06/17/2024 07:31:21 PM TECHNIQUE: CT of the head was performed without the administration of intravenous contrast. Automated exposure control, iterative reconstruction, and/or weight based adjustment of the mA/kV was utilized to reduce the radiation dose to as low as reasonably achievable. COMPARISON: None available. CLINICAL HISTORY: Transient ischemic attack (TIA). 45 minute episode of vision trouble with left eye. Has since resolved. No history of stroke. FINDINGS: BRAIN AND VENTRICLES: Focus of hypoattenuation within the anterior left temporal lobe (series 2 image 9) is presumed chronic though age indeterminate without comparison. If there is ongoing concern for acute stroke, MRI is recommended. Chronic microvascular ischemia and generalized atrophy. No acute hemorrhage. No hydrocephalus. No extra-axial collection. No mass effect or midline shift. ORBITS: No acute abnormality. SINUSES: No acute abnormality. SOFT TISSUES AND SKULL: No acute soft tissue abnormality. No skull fracture. IMPRESSION: 1. 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 If there is ongoing concern for acute stroke, MRI is recommended. Electronically signed by: Norman Gatlin MD 06/17/2024 07:41 PM EDT RP Workstation: [id number]     Procedures   Medications Ordered in the ED - No data to display  83  y.o. male presents to the ED with complaints of visual disturbances, this involves an extensive number of treatment options, and is a complaint that carries with it a high risk of complications and morbidity.  The differential diagnosis includes CVA, TIA, intracranial mass, intracranial bleed (Ddx)  On arrival pt is nontoxic, vitals slightly hypertensive. Exam significant for no focal deficits no weakness no sensory defects.  EOM intact and pupils equal and reactive to light.  Additional history obtained from chart review. Previous records obtained and reviewed known history of atrial premature contractions, patient reports his cardiologist reports it is not concerning  Lab Tests:  I Ordered, reviewed, and interpreted labs, which included:   Imaging Studies ordered:  I ordered imaging studies which included CT head without contrast, I independently visualized and interpreted imaging which showed presumed chronic infarct within the anterior left temporal lobe.  Recommended MRI if acute stroke is suspected, neurology  is consulted for further recommendation.   ED Course:   Patient sitting comfortably in ED bed.  Patient is in no obvious distress.  He has no focal deficits on neuroexam.  No weakness in any extremity.  No decrease sensation.  He has no loss of gait or loss of balance while standing.  There is no decreased strength and EOM is intact with no visual disturbances.  After discussion with attending further stroke workup was recommended. Patient had reported transient gaze palsy around 4 PM that lasted roughly 45 minutes.  Patient has no deficits now and no reported symptoms.  Patient denies shortness of breath, headache, chest pain, dizziness, weakness.  On reevaluation patient has no new symptoms and no new weaknesses or deficits.  Patient is sitting reading in bed comfortably with no reports of visual changes.  Patient was advised neurology would be consulted for further  recommendation.  I consulted Dr. Merrianne with neurology and discussed lab and imaging findings. He recommended TIA workup at [facility] with CTA of head and neck and MRI of the head for transient medial rectal palsy.   Patient care was transferred to [facility]   Portions of this note were generated with Dragon dictation software. Dictation errors may occur despite best attempts at proofreading.    Final diagnoses:  Gaze palsy    ED Discharge Orders     None          Myriam Fonda GORMAN DEVONNA 06/17/24 2243    Melvenia Motto, MD 06/17/24 [phone]

## 2024-06-18 ENCOUNTER — Other Ambulatory Visit: Payer: Self-pay

## 2024-06-18 ENCOUNTER — Observation Stay (HOSPITAL_COMMUNITY)

## 2024-06-18 ENCOUNTER — Other Ambulatory Visit (HOSPITAL_COMMUNITY): Payer: Self-pay

## 2024-06-18 ENCOUNTER — Encounter (HOSPITAL_COMMUNITY): Payer: Self-pay | Admitting: Family Medicine

## 2024-06-18 DIAGNOSIS — G459 Transient cerebral ischemic attack, unspecified: Secondary | ICD-10-CM | POA: Diagnosis present

## 2024-06-18 DIAGNOSIS — H539 Unspecified visual disturbance: Secondary | ICD-10-CM | POA: Diagnosis not present

## 2024-06-18 DIAGNOSIS — R29700 NIHSS score 0: Secondary | ICD-10-CM | POA: Diagnosis not present

## 2024-06-18 DIAGNOSIS — I639 Cerebral infarction, unspecified: Secondary | ICD-10-CM | POA: Diagnosis not present

## 2024-06-18 LAB — ECHOCARDIOGRAM COMPLETE
AR max vel: 2.34 cm2
AV Area VTI: 2.73 cm2
AV Area mean vel: 2.19 cm2
AV Mean grad: 6.2 mmHg
AV Peak grad: 12.3 mmHg
Ao pk vel: 1.75 m/s
Area-P 1/2: 2.99 cm2
Height: 70 in
S' Lateral: 3.1 cm
Weight: 3179.2 [oz_av]

## 2024-06-18 LAB — CBC
HCT: 45.9 % (ref 39.0–52.0)
Hemoglobin: 14 g/dL (ref 13.0–17.0)
MCH: 29.3 pg (ref 26.0–34.0)
MCHC: 30.5 g/dL (ref 30.0–36.0)
MCV: 96 fL (ref 80.0–100.0)
Platelets: 189 K/uL (ref 150–400)
RBC: 4.78 MIL/uL (ref 4.22–5.81)
RDW: 13 % (ref 11.5–15.5)
WBC: 7.9 K/uL (ref 4.0–10.5)
nRBC: 0 % (ref 0.0–0.2)

## 2024-06-18 LAB — BASIC METABOLIC PANEL WITH GFR
Anion gap: 9 (ref 5–15)
BUN: 13 mg/dL (ref 8–23)
CO2: 27 mmol/L (ref 22–32)
Calcium: 9.1 mg/dL (ref 8.9–10.3)
Chloride: 106 mmol/L (ref 98–111)
Creatinine, Ser: 0.81 mg/dL (ref 0.61–1.24)
GFR, Estimated: >60 mL/min (ref >60)
Glucose, Bld: 86 mg/dL (ref 70–99)
Potassium: 3.9 mmol/L (ref 3.5–5.1)
Sodium: 141 mmol/L (ref 135–145)

## 2024-06-18 LAB — LIPID PANEL
Cholesterol: 126 mg/dL (ref 0–200)
HDL: 44 mg/dL (ref >40)
LDL Cholesterol: 58 mg/dL (ref 0–99)
Total CHOL/HDL Ratio: 2.9 ratio
Triglycerides: 119 mg/dL (ref <150)
VLDL: 24 mg/dL (ref 0–40)

## 2024-06-18 LAB — HEMOGLOBIN A1C
Hgb A1c MFr Bld: 5.6 % (ref 4.8–5.6)
Mean Plasma Glucose: 114.02 mg/dL

## 2024-06-18 MED ORDER — ENOXAPARIN SODIUM 40 MG/0.4ML IJ SOSY
40.0000 mg | PREFILLED_SYRINGE | INTRAMUSCULAR | Status: DC
  Administered 2024-06-18: 40 mg via SUBCUTANEOUS
  Filled 2024-06-18: qty 0.4

## 2024-06-18 MED ORDER — FINASTERIDE 5 MG PO TABS
5.0000 mg | ORAL_TABLET | Freq: Every day | ORAL | Status: DC
  Administered 2024-06-18: 5 mg via ORAL
  Filled 2024-06-18: qty 1

## 2024-06-18 MED ORDER — TAMSULOSIN HCL 0.4 MG PO CAPS
0.4000 mg | ORAL_CAPSULE | Freq: Every day | ORAL | Status: DC
  Administered 2024-06-18: 0.4 mg via ORAL
  Filled 2024-06-18: qty 1

## 2024-06-18 MED ORDER — ACETAMINOPHEN 650 MG RE SUPP: 650.0000 mg | RECTAL | Status: DC | PRN

## 2024-06-18 MED ORDER — CLOPIDOGREL BISULFATE 75 MG PO TABS
75.0000 mg | ORAL_TABLET | Freq: Every day | ORAL | 0 refills | Status: AC
  Filled 2024-06-18: qty 20, 20d supply, fill #0

## 2024-06-18 MED ORDER — ACETAMINOPHEN 160 MG/5ML PO SOLN: 650.0000 mg | ORAL | Status: DC | PRN

## 2024-06-18 MED ORDER — PERFLUTREN LIPID MICROSPHERE
1.0000 mL | INTRAVENOUS | Status: AC | PRN
  Administered 2024-06-18: 2 mL via INTRAVENOUS

## 2024-06-18 MED ORDER — STROKE: EARLY STAGES OF RECOVERY BOOK
Freq: Once | Status: DC
Start: 2024-06-19 — End: 2024-06-18

## 2024-06-18 MED ORDER — SODIUM CHLORIDE 0.9 % IV SOLN: INTRAVENOUS | Status: DC

## 2024-06-18 MED ORDER — ASPIRIN 81 MG PO TBEC
81.0000 mg | DELAYED_RELEASE_TABLET | Freq: Every day | ORAL | Status: DC
  Administered 2024-06-18: 81 mg via ORAL
  Filled 2024-06-18: qty 1

## 2024-06-18 MED ORDER — ACETAMINOPHEN 325 MG PO TABS: 650.0000 mg | ORAL_TABLET | ORAL | Status: DC | PRN

## 2024-06-18 MED ORDER — ASPIRIN 81 MG PO TBEC: 81.0000 mg | DELAYED_RELEASE_TABLET | Freq: Every day | ORAL | Status: AC

## 2024-06-18 MED ORDER — ATORVASTATIN CALCIUM 20 MG PO TABS
20.0000 mg | ORAL_TABLET | Freq: Every day | ORAL | Status: DC
Start: 2024-06-18 — End: 2024-06-18
  Administered 2024-06-18: 20 mg via ORAL
  Filled 2024-06-18: qty 2

## 2024-06-18 MED ORDER — SENNOSIDES-DOCUSATE SODIUM 8.6-50 MG PO TABS: 1.0000 | ORAL_TABLET | Freq: Every evening | ORAL | Status: DC | PRN

## 2024-06-18 MED ORDER — CLOPIDOGREL BISULFATE 75 MG PO TABS
75.0000 mg | ORAL_TABLET | Freq: Every day | ORAL | Status: DC
  Administered 2024-06-18: 75 mg via ORAL
  Filled 2024-06-18: qty 1

## 2024-06-18 NOTE — H&P (Signed)
 History and Physical    Gerald Shelton FMW:[id number] DOB: June 22, 1941 DOA: 06/17/2024  PCP: Berneta Elsie Sayre, MD   Patient coming from: Home   Chief Complaint: Vision disturbance, disequilibrium   HPI: Gerald Shelton is an 83 y.o. male with medical history significant for BPH, hyperlipidemia, and ILD who presents after an episode of visual disturbance and difficulty with balance.  Patient reports that he was in his usual state of health and having an uneventful day when he developed diplopia and disequilibrium at around 4 PM.  Symptoms completely resolved after approximately 45 minutes and he reports feeling back to his usual self.  There was no associated focal numbness, focal weakness, chest pain, headache, or palpitations.  He has not experienced this previously.  ED Course: Upon arrival to the ED, patient is found to be afebrile and saturating mid 90s on room air with normal HR and stable BP.  CMP and CBC are normal.  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  CTA of the head and neck is negative for large vessel occlusion or hemodynamically significant stenosis.  Neurology (Dr. Merrianne) was consulted by the ED PA and recommended admission to [facility] for TIA workup.  Review of Systems:  All other systems reviewed and apart from HPI, are negative.  Past Medical History:  Diagnosis Date   A-fib (HCC) 10/25/2016   per pt not afib PAC's   Anxiety attack    Atrial premature complexes    Cancer (HCC)    SKIN, ON FACE   Chest pain 10/25/2016   ED (erectile dysfunction)    GERD (gastroesophageal reflux disease)    Hay fever    Hyperlipidemia    Irregular heartbeat    PUD (peptic ulcer disease)    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    Past Surgical History:  Procedure Laterality Date   BRONCHIAL WASHINGS  07/28/2022   Procedure: BRONCHIAL WASHINGS;  Surgeon: Annella Donnice SAUNDERS, MD;  Location: [facility]  ENDOSCOPY;  Service: Endoscopy;;   EYE SURGERY     cataract removed form right eye   HERNIA REPAIR     x2   NASAL SEPTUM SURGERY     VIDEO BRONCHOSCOPY N/A 07/28/2022   Procedure: VIDEO BRONCHOSCOPY WITHOUT FLUORO;  Surgeon: Annella Donnice SAUNDERS, MD;  Location: [facility] ENDOSCOPY;  Service: Endoscopy;  Laterality: N/A;    Social History:   reports that he quit smoking about 25 years ago. His smoking use included cigarettes. He has never been exposed to tobacco smoke. He has never used smokeless tobacco. He reports current alcohol use. He reports that he does not use drugs.  Allergies  Allergen Reactions   Augmentin [Amoxicillin-Pot Clavulanate] Other (See Comments)    GI Upset   Metformin  And Related Other (See Comments)    Cramps with loose stool    Zocor [Simvastatin] Other (See Comments)    GI Upset    Family History  Problem Relation Age of Onset   Diabetes Son    Parkinson's disease Mother    Depression Mother    Dementia Mother      Prior to Admission medications   Medication Sig Start Date End Date Taking? Authorizing Provider  atorvastatin  (LIPITOR) 20 MG tablet TAKE 1 TABLET BY MOUTH EVERY DAY 02/05/24  Yes Berneta Elsie Sayre, MD  augmented betamethasone dipropionate (DIPROLENE-AF) 0.05 % cream Apply 1 Application topically daily as needed (itching/rash.). 03/15/22  Yes [provider]  carbamide peroxide (DEBROX) 6.5 % OTIC solution Place 5 drops into both ears 2 (two) times daily. Patient taking differently: Place 5 drops into both ears as needed (Ear wax build up). 05/30/24  Yes Reddick, Johnathan B, NP  cyanocobalamin  (VITAMIN B12) 1000 MCG tablet Take 1 tablet (1,000 mcg total) by mouth daily. 05/05/23  Yes Berneta Elsie Sayre, MD  empagliflozin  (JARDIANCE ) 25 MG TABS tablet Take 1 tablet (25 mg total) by mouth daily before breakfast. 05/21/24  Yes Berneta Elsie Sayre, MD  finasteride  (PROSCAR ) 5 MG tablet TAKE 1 TABLET (5 MG TOTAL) BY MOUTH DAILY.  02/06/24  Yes Berneta Elsie Sayre, MD  Multiple Vitamin (MULTIVITAMIN WITH MINERALS) TABS tablet Take 1 tablet by mouth in the morning. One A Day   Yes [provider]  Olopatadine  HCl (PATADAY ) 0.2 % SOLN Place 1 drop into both eyes 1 day or 1 dose. 05/10/23  Yes Lorin Norris, MD  tamsulosin  (FLOMAX ) 0.4 MG CAPS capsule Take 1 capsule (0.4 mg total) by mouth daily. 05/21/24  Yes Berneta Elsie Sayre, MD    Physical Exam: Vitals:   06/17/24 2100 06/17/24 2115 06/17/24 2130 06/17/24 2154  BP: (!) 145/68 129/68 134/69   Pulse: 72 (!) 55 (!) 59   Resp: 15 (!) 24 19   Temp:    97.9 F (36.6 C)  TempSrc:    Oral  SpO2: 94% 98% 94%     Constitutional: NAD, calm  Eyes: PERTLA, lids and conjunctivae normal ENMT: Mucous membranes are moist. Posterior pharynx clear of any exudate or lesions.   Neck: supple, no masses  Respiratory: no wheezing, no crackles. No accessory muscle use.  Cardiovascular: S1 & S2 heard, regular rate and rhythm. No extremity edema.   Abdomen: No distension, no tenderness, soft. Bowel sounds active.  Musculoskeletal: no clubbing / cyanosis. No joint deformity upper and lower extremities.   Skin: no significant rashes, lesions, ulcers. Warm, dry, well-perfused. Neurologic: CN 2-12 grossly intact aside from hearing deficit. Sensation to light touch intact. Strength 5/5 in all 4 limbs. Alert and oriented.  Psychiatric: Pleasant. Cooperative.    Labs and Imaging on Admission: I have personally reviewed following labs and imaging studies  CBC: Recent Labs  Lab 06/17/24 1920  WBC 9.9  NEUTROABS 7.8*  HGB 15.4  HCT 49.1  MCV 96.1  PLT 214   Basic Metabolic Panel: Recent Labs  Lab 06/17/24 1920  NA 143  K 4.2  CL 106  CO2 26  GLUCOSE 117*  BUN 18  CREATININE 0.95  CALCIUM  9.4   GFR: CrCl cannot be calculated (Unknown ideal weight.). Liver Function Tests: Recent Labs  Lab 06/17/24 1920  AST 26  ALT 20  ALKPHOS 94  BILITOT 0.3   PROT 6.9  ALBUMIN 4.2   No results for input(s): LIPASE, AMYLASE in the last 168 hours. No results for input(s): AMMONIA in the last 168 hours. Coagulation Profile: Recent Labs  Lab 06/17/24 1920  INR 0.9   Cardiac Enzymes: No results for input(s): CKTOTAL, CKMB, CKMBINDEX, TROPONINI in the last 168 hours. BNP (last 3 results) No results for input(s): PROBNP in the last 8760 hours. HbA1C: No results for input(s): HGBA1C in the last 72 hours. CBG: No results for input(s): GLUCAP in the last 168 hours. Lipid Profile: No results for input(s): CHOL, HDL, LDLCALC, TRIG, CHOLHDL, LDLDIRECT in the last 72 hours. Thyroid  Function Tests: No results for input(s): TSH, T4TOTAL, FREET4, T3FREE, THYROIDAB in the last 72 hours. Anemia Panel: No results for  input(s): VITAMINB12, FOLATE, FERRITIN, TIBC, IRON, RETICCTPCT in the last 72 hours. Urine analysis:    Component Value Date/Time   COLORURINE YELLOW 11/21/2023 1058   APPEARANCEUR CLEAR 11/21/2023 1058   LABSPEC 1.020 11/21/2023 1058   PHURINE 6.0 11/21/2023 1058   GLUCOSEU >=1000 (A) 11/21/2023 1058   HGBUR NEGATIVE 11/21/2023 1058   BILIRUBINUR NEGATIVE 11/21/2023 1058   KETONESUR NEGATIVE 11/21/2023 1058   PROTEINUR NEGATIVE 07/03/2022 1105   UROBILINOGEN 0.2 11/21/2023 1058   NITRITE NEGATIVE 11/21/2023 1058   LEUKOCYTESUR NEGATIVE 11/21/2023 1058   Sepsis Labs: @LABRCNTIP (procalcitonin:4,lacticidven:4) )No results found for this or any previous visit (from the past 240 hours).   Radiological Exams on Admission: CT ANGIO HEAD NECK W WO CM Result Date: 06/18/2024 CLINICAL DATA:  Follow-up examination for stroke. EXAM: CT ANGIOGRAPHY HEAD AND NECK WITH AND WITHOUT CONTRAST TECHNIQUE: Multidetector CT imaging of the head and neck was performed using the standard protocol during bolus administration of intravenous contrast. Multiplanar CT image reconstructions and MIPs were  obtained to evaluate the vascular anatomy. Carotid stenosis measurements (when applicable) are obtained utilizing NASCET criteria, using the distal internal carotid diameter as the denominator. RADIATION DOSE REDUCTION: This exam was performed according to the departmental dose-optimization program which includes automated exposure control, adjustment of the mA and/or kV according to patient size and/or use of iterative reconstruction technique. CONTRAST:  75mL OMNIPAQUE  IOHEXOL  350 MG/ML SOLN COMPARISON:  CT from earlier the same day. FINDINGS: CTA NECK FINDINGS Aortic arch: Visualized arch within normal limits for caliber with standard branch pattern. Aortic atherosclerosis. No significant stenosis about the origin the great vessels. Right carotid system: Right common and internal carotid arteries are patent without dissection. Mild atheromatous change about the right carotid bulb without hemodynamically significant greater than 50% stenosis. Left carotid system: Left common and internal carotid arteries are patent without dissection. Mild atheromatous change about the left carotid bulb without hemodynamically significant greater than 50% stenosis. Vertebral arteries: Both vertebral arteries arise from subclavian arteries. No significant proximal subclavian artery stenosis. Vertebral arteries are patent without stenosis or dissection. Skeleton: No worrisome osseous lesions. Patient is edentulous. Other neck: No other acute finding. Upper chest: Emphysema. No other acute finding. Review of the MIP images confirms the above findings CTA HEAD FINDINGS Anterior circulation: Mild atheromatous change about the carotid siphons without hemodynamically significant stenosis. A1 segments, anterior communicating artery complex, and anterior cerebral arteries widely patent without stenosis. No M1 stenosis or occlusion. No proximal MCA branch occlusion. Distal MCA branches perfused and symmetric. Posterior circulation: Both V4  segments patent without stenosis. Both PICA patent. Basilar patent without stenosis. Superior cerebral arteries patent bilaterally. 3 mm aneurysm seen at the origin of the left SCA (series 9, image 185). Both PCAs primarily supplied via the basilar. PCAs patent without significant stenosis. Venous sinuses: Patent allowing for timing the contrast bolus. Anatomic variants: None significant. Review of the MIP images confirms the above findings IMPRESSION: 1. Negative CTA for large vessel occlusion or other emergent finding. 2. Mild atheromatous change about the carotid bifurcations and carotid siphons without hemodynamically significant stenosis. 3. 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 Aortic Atherosclerosis (ICD10-I70.0) and Emphysema (ICD10-J43.9). Electronically Signed   By: Morene Hoard M.D.   On: 06/18/2024 00:00   CT HEAD WO CONTRAST Result Date: 06/17/2024 EXAM: CT HEAD WITHOUT CONTRAST 06/17/2024 07:31:21 PM TECHNIQUE: CT of the head was performed without the administration of intravenous contrast. Automated exposure control, iterative reconstruction, and/or weight based adjustment of  the mA/kV was utilized to reduce the radiation dose to as low as reasonably achievable. COMPARISON: None available. CLINICAL HISTORY: Transient ischemic attack (TIA). 45 minute episode of vision trouble with left eye. Has since resolved. No history of stroke. FINDINGS: BRAIN AND VENTRICLES: Focus of hypoattenuation within the anterior left temporal lobe (series 2 image 9) is presumed chronic though age indeterminate without comparison. If there is ongoing concern for acute stroke, MRI is recommended. Chronic microvascular ischemia and generalized atrophy. No acute hemorrhage. No hydrocephalus. No extra-axial collection. No mass effect or midline shift. ORBITS: No acute abnormality. SINUSES: No acute abnormality. SOFT TISSUES AND SKULL: No acute soft tissue abnormality. No skull fracture. IMPRESSION: 1. 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 If there is ongoing concern for acute stroke, MRI is recommended. Electronically signed by: Norman Gatlin MD 06/17/2024 07:41 PM EDT RP Workstation: [id number]    EKG: Independently reviewed. Sinus rhythm, PAC.   Assessment/Plan   1. Transient vision disturbance and disequilibrium  - Continue cardiac monitoring and frequent neuro checks, check A1c, lipids, MRI brain, and echocardiogram, keep NPO pending swallow screen, consult PT/OT/SLP    2. Hyperlipidemia  - Lipitor    3. ILD  - Stable   8. BPH  - Finasteride , Flomax      DVT prophylaxis: Lovenox   Code Status: Full  Level of Care: Level of care: Telemetry Medical Family Communication: None present  Disposition Plan:  Patient is from: Home  Anticipated d/c is to: Home  Anticipated d/c date is: 9/9 or 06/19/24  Patient currently: Pending TIA workup Consults called: Neurology  Admission status: Observation     Evalene GORMAN Sprinkles, MD [facility]  06/18/2024, 12:28 AM

## 2024-06-18 NOTE — Evaluation (Signed)
 Physical Therapy Vestibular Evaluation Patient Details Name: Gerald Shelton MRN: [id number] DOB: Apr 08, 1941 Today's Date: 06/18/2024  History of Present Illness  Gerald Shelton is an 83 y.o. male presents to urgent care and sent to ED on 06/17/24 with visual deficits and balance difficulties. MRI was negative for acute changes. Medical history significant for BPH, hyperlipidemia, and ILD.  Clinical Impression  Pt saw for vestibular assessment.  Vestibular assessment negative for red flags for central cause at this time.  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 Pt did report clogged ears 1-2 weeks ago. Pt's symptoms are mild - educated on exercises and compensation techniques.  Recommended continuing HEP and if not improving could f/u with vestibular outpt PT in near future - Pt agrees.  Will continue to follow in acute care to progress as able.        If plan is discharge home, recommend the following: Assistance with cooking/housework;Assist for transportation;Help with stairs or ramp for entrance   Can travel by private vehicle        Equipment Recommendations None recommended by PT  Recommendations for Other Services       Functional Status Assessment Patient has had a recent decline in their functional status and demonstrates the ability to make significant improvements in function in a reasonable and predictable amount of time.     Precautions / Restrictions Precautions Precautions: Fall      Mobility  Bed Mobility        Pt seen earlier by PT for mobility; this session focused vestibular            Transfers                        Ambulation/Gait                  Stairs            Wheelchair Mobility     Tilt Bed    Modified [place] (Stroke Patients Only)       Balance                                             Pertinent Vitals/Pain Pain  Assessment Pain Assessment: No/denies pain    Home Living Family/patient expects to be discharged to:: Private residence Living Arrangements: Spouse/significant other Available Help at Discharge: Family Type of Home: House Home Access: Stairs to enter Entrance Stairs-Rails: Left Entrance Stairs-Number of Steps: 4   Home Layout: One level Home Equipment: None      Prior Function Prior Level of Function : Independent/Modified Independent;Working/employed;Driving             Mobility Comments: raises herding dogs ADLs Comments: indep incl driving and all household and famr chores     Extremity/Trunk Assessment   Upper Extremity Assessment Upper Extremity Assessment: Right hand dominant;Overall WFL for tasks assessed (no coordination issues noted)    Lower Extremity Assessment Lower Extremity Assessment: Defer to PT evaluation    Cervical / Trunk Assessment Cervical / Trunk Assessment: Normal  Communication   Communication Communication: No apparent difficulties    Cognition Arousal: Alert Behavior During Therapy: WFL for tasks assessed/performed   PT - Cognitive impairments: No apparent impairments  Cueing       General Comments General comments (skin integrity, edema, etc.): no issues with skin, edema nor SOB  Vestibular Assessment:  History:  Pt reports sudden onset of visual disturbances yesterday.  States felts vision was shaky and double.  Wife noted that L eye not tracking equally so presented to urgent care.  Pt reports some improvement in symptoms but does still get double vision and some dizziness when looking R.  States has not had vertigo in past but does report 4-5 episodes of shaky vision over life time. Denies any jarring experience recently.  Denies colds, virus, etc but did report had clogged ears (ear wax) within past 2 weeks that had to be cleaned by MD.   MRI was negative for acute  changes  Resting Nystagmus: Negative Gaze Induced Nystagmus: R beating with R gaze, reports mild dizzy and double vision to R Smooth pursuit: Intact with good coordination, no deficits noted L eye today Gaze Stabilization: intact but only tolerates slowly Head Thrust: + L head Thrust Test of Skew: Negative Head Shake: Negative  Saccades: intact  Shona Grebe Dix/Horizontal Roll: not indicated  Educated pt on compensation techniques of segmental turns and focus points - pt provided teach back. Also , discussed slow transfers and if feels dizzy to sit.  Pt reports symptoms are mild and feels confident with mobility as long as he takes his time.  Educated pt that symptoms now consistent with vestibular hypofunction and provided below HEP.  Did discuss that visual disturbances (difficulty tracking, double vision, field cuts) can be signs of CVA so if develop in future to continue to do what he did and present to ED.    Access Code: [email or web address] URL: [email or web address] Date: 06/18/2024 Prepared by: Benjiman Mulberry  Program Notes Vestibular Hypofunction Exercises.  Gaze Stabilization is the most important one.   Exercises - Seated Gaze Stabilization with Head Rotation  - 3 x daily - 7 x weekly - 3 sets - 1 reps - 30 hold - Seated Horizontal Smooth Pursuit  - 3 x daily - 7 x weekly - 3 sets - 1 reps - 30 hold - Seated Horizontal Saccades  - 3 x daily - 7 x weekly - 3 sets - 1 reps - 30 hold   Exercises     Assessment/Plan    PT Assessment Patient needs continued PT services  PT Problem List Decreased strength;Decreased knowledge of use of DME;Decreased activity tolerance;Decreased safety awareness;Decreased balance;Decreased knowledge of precautions;Decreased mobility       PT Treatment Interventions Gait training;Functional mobility training;Therapeutic activities;Patient/family education;Balance training;Other (comment);Neuromuscular re-education (habituation ex)    PT Goals  (Current goals can be found in the Care Plan section)  Acute Rehab PT Goals Patient Stated Goal: go home PT Goal Formulation: With patient Time For Goal Achievement: 07/02/24 Potential to Achieve Goals: Good    Frequency Min 3X/week     Co-evaluation               AM-PAC PT 6 Clicks Mobility  Outcome Measure Help needed turning from your back to your side while in a flat bed without using bedrails?: None Help needed moving from lying on your back to sitting on the side of a flat bed without using bedrails?: None Help needed moving to and from a bed to a chair (including a wheelchair)?: A Little Help needed standing up from a chair using your arms (e.g., wheelchair or bedside chair)?: A Little Help needed to walk in hospital  room?: A Little Help needed climbing 3-5 steps with a railing? : A Little 6 Click Score: 20    End of Session Equipment Utilized During Treatment: Gait belt Activity Tolerance: Patient tolerated treatment well Patient left: with call bell/phone within reach;in bed Nurse Communication: Mobility status PT Visit Diagnosis: Unsteadiness on feet (R26.81);Dizziness and giddiness (R42)    Time: [id number] (out of room 10 mins to get HEP) also no eval charge as already had eval today for mobiltiy PT Time Calculation (min) (ACUTE ONLY): 44 min   Charges:     PT Treatments $Therapeutic Exercise: 8-22 mins $Therapeutic Activity: 8-22 mins PT General Charges $$ ACUTE PT VISIT: 1 Visit         Benjiman, PT Acute Rehab [facility] [phone]   Benjiman VEAR Mulberry 06/18/2024, 2:21 PM

## 2024-06-18 NOTE — Evaluation (Signed)
 Occupational Therapy Evaluation Patient Details Name: Gerald Shelton MRN: [id number] DOB: September 02, 1941 Today's Date: 06/18/2024   History of Present Illness   Gerald Shelton is an 83 y.o. male presents to UC, sent to Ed 06/17/24 with visual deficits and balance difficulties.Medical history significant for BPH, hyperlipidemia, and ILD. MRI- No acute intracranial abnormality.Moderate chronic cerebral white matter signal changes and mild  chronic signal changes in the right pons, most commonly due to  chronic small vessel disease.     Clinical Impressions Patient evaluated by Occupational Therapy with no further acute OT needs identified. All education has been completed and the patient has no further questions. OT assessed for VFD and gross visual assessment with no s/s of deficits noted as well as UE coord and strength all WNL's for evaluation. Agree with PT rec for outpt vestibular assessment.  OT educated on figure 4 techniques for reach to feet, use of reacher at home to assist with item retrieval as well as shower seat for bathing and allowing wife to complete higher level IADL's for now with + understanding and teach back.  See below for any follow-up Occupational Therapy or equipment needs. OT is signing off. Thank you for this referral.    If plan is discharge home, recommend the following:   A little help with walking and/or transfers;A little help with bathing/dressing/bathroom;Assistance with cooking/housework;Assist for transportation;Help with stairs or ramp for entrance     Functional Status Assessment   Patient has not had a recent decline in their functional status     Equipment Recommendations   None recommended by OT      Precautions/Restrictions   Precautions Precautions: Fall Restrictions Weight Bearing Restrictions Per Provider Order: No     Mobility Bed Mobility Overal bed mobility: Independent                  Transfers Overall transfer level: Needs  assistance   Transfers: Sit to/from Stand, Bed to chair/wheelchair/BSC Sit to Stand: Supervision     Step pivot transfers: Supervision (amb in room to stand sink side for grooming with no LOB)     General transfer comment: noted slight balance  loss upon standing, steady assist required      Balance Overall balance assessment: Needs assistance   Sitting balance-Leahy Scale: Normal     Standing balance support: No upper extremity supported Standing balance-Leahy Scale: Fair Standing balance comment: good static, fair/poor dynamic                           ADL either performed or assessed with clinical judgement   ADL Overall ADL's : Independent;At baseline                                             Vision Baseline Vision/History: 0 No visual deficits;1 Wears glasses Ability to See in Adequate Light: 0 Adequate Patient Visual Report: No change from baseline (reported diplopia during incisdent but none since, OT assessed VF via gross confrontation with no VFD noted nor issues with tracking or occular movement deficits)              Pertinent Vitals/Pain Pain Assessment Pain Assessment: No/denies pain     Extremity/Trunk Assessment Upper Extremity Assessment Upper Extremity Assessment: Right hand dominant;Overall [facility] for tasks assessed (no coordination issues noted)   Lower Extremity Assessment  Lower Extremity Assessment: Defer to PT evaluation   Cervical / Trunk Assessment Cervical / Trunk Assessment: Normal   Communication Communication Communication: No apparent difficulties   Cognition Arousal: Alert Behavior During Therapy: WFL for tasks assessed/performed Cognition: No apparent impairments                               Following commands: Intact       Cueing  General Comments      no issues with skin, edema nor SOB           Home Living Family/patient expects to be discharged to:: Private  residence Living Arrangements: Spouse/significant other Available Help at Discharge: Family Type of Home: House Home Access: Stairs to enter Secretary/administrator of Steps: 4 Entrance Stairs-Rails: Left Home Layout: One level     Bathroom Shower/Tub: Producer, television/film/video: Handicapped height     Home Equipment: None          Prior Functioning/Environment Prior Level of Function : Independent/Modified Independent;Working/employed;Driving             Mobility Comments: raises herding dogs ADLs Comments: indep incl driving and all household and famr chores     AM-PAC OT 6 Clicks Daily Activity     Outcome Measure Help from another person eating meals?: None Help from another person taking care of personal grooming?: None Help from another person toileting, which includes using toliet, bedpan, or urinal?: None Help from another person bathing (including washing, rinsing, drying)?: None Help from another person to put on and taking off regular upper body clothing?: None Help from another person to put on and taking off regular lower body clothing?: None 6 Click Score: 24   End of Session Equipment Utilized During Treatment: Gait belt Nurse Communication: Mobility status  Activity Tolerance: Patient tolerated treatment well Patient left: in chair;with call bell/phone within reach                   Time: 0845-0905 OT Time Calculation (min): 20 min Charges:  OT General Charges $OT Visit: 1 Visit OT Evaluation $OT Eval Low Complexity: 1 Low Nicey Krah OT/L Acute Rehabilitation Department  [phone]  06/18/2024, 12:47 PM

## 2024-06-18 NOTE — Progress Notes (Signed)
 AVS reviewed w/ pt who verbalized an understanding. PIV removed as noted. Pt dressed for d/c to home. Discharge med in  a secure bag delivered to pt by this RN, Ride enroute - pt to lobby - home w/ wife

## 2024-06-18 NOTE — Consult Note (Signed)
 NEUROLOGY CONSULT NOTE   Date of service: June 18, 2024 Patient Name: Gerald Shelton MRN:  [id number] DOB:  [birth year] Chief Complaint: Transient disequilibrium and ongoing diplopia Requesting Provider: Patsy Lenis, MD  History of Present Illness  Gerald Shelton is a 83 y.o. male with hx of BPH, hyperlipidemia, PACs and ILD who presents after having an episode of diplopia and imbalance which occurred yesterday afternoon.  He reports that symptoms developed suddenly around 4 PM that he experienced horizontal diplopia and disequilibrium with gait impairment.  He states that symptoms resolved for the most part, although he reports some residual horizontal diplopia at times.  He states he also had some nausea but has had no recent illnesses and no other recent symptoms.  LKW: 9/8 1559 Modified rankin score: 0-Completely asymptomatic and back to baseline post- stroke IV Thrombolysis: No, outside of window symptoms largely resolved EVT: No, no LVO  NIHSS components Score: Comment  1a Level of Conscious 0[x]  1[]  2[]  3[]      1b LOC Questions 0[x]  1[]  2[]       1c LOC Commands 0[x]  1[]  2[]       2 Best Gaze 0[x]  1[]  2[]       3 Visual 0[x]  1[]  2[]  3[]      4 Facial Palsy 0[x]  1[]  2[]  3[]      5a Motor Arm - left 0[x]  1[]  2[]  3[]  4[]  UN[]    5b Motor Arm - Right 0[x]  1[]  2[]  3[]  4[]  UN[]    6a Motor Leg - Left 0[x]  1[]  2[]  3[]  4[]  UN[]    6b Motor Leg - Right 0[x]  1[]  2[]  3[]  4[]  UN[]    7 Limb Ataxia 0[x]  1[]  2[]  UN[]      8 Sensory 0[x]  1[]  2[]  UN[]      9 Best Language 0[x]  1[]  2[]  3[]      10 Dysarthria 0[x]  1[]  2[]  UN[]      11 Extinct. and Inattention 0[x]  1[]  2[]       TOTAL:0       ROS  Comprehensive ROS performed and pertinent positives documented in HPI   Past History   Past Medical History:  Diagnosis Date   A-fib (HCC) 10/25/2016   per pt not afib PAC's   Anxiety attack    Atrial premature complexes    Cancer (HCC)    SKIN, ON FACE   Chest pain 10/25/2016   ED (erectile  dysfunction)    GERD (gastroesophageal reflux disease)    Hay fever    Hyperlipidemia    Irregular heartbeat    PUD (peptic ulcer disease)    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    Past Surgical History:  Procedure Laterality Date   BRONCHIAL WASHINGS  07/28/2022   Procedure: BRONCHIAL WASHINGS;  Surgeon: Annella Donnice SAUNDERS, MD;  Location: [facility];  Service: Endoscopy;;   EYE SURGERY     cataract removed form right eye   HERNIA REPAIR     x2   NASAL SEPTUM SURGERY     VIDEO BRONCHOSCOPY N/A 07/28/2022   Procedure: VIDEO BRONCHOSCOPY WITHOUT FLUORO;  Surgeon: Annella Donnice SAUNDERS, MD;  Location: [facility];  Service: Endoscopy;  Laterality: N/A;    Family History: Family History  Problem Relation Age of Onset   Diabetes Son    Parkinson's disease Mother    Depression Mother    Dementia Mother     Social History  reports that he quit smoking about 25 years ago. His smoking use included cigarettes. He has never been exposed to tobacco smoke. He has  never used smokeless tobacco. He reports current alcohol use. He reports that he does not use drugs.  Allergies  Allergen Reactions   Augmentin [Amoxicillin-Pot Clavulanate] Other (See Comments)    GI Upset   Metformin  And Related Other (See Comments)    Cramps with loose stool    Zocor [Simvastatin] Other (See Comments)    GI Upset    Medications   Current Facility-Administered Medications:    [START ON 06/19/2024]  stroke: early stages of recovery book, , Does not apply, Once, Opyd, Evalene RAMAN, MD   acetaminophen  (TYLENOL ) tablet 650 mg, 650 mg, Oral, Q4H PRN **OR** acetaminophen  (TYLENOL ) 160 MG/5ML solution 650 mg, 650 mg, Per Tube, Q4H PRN **OR** acetaminophen  (TYLENOL ) suppository 650 mg, 650 mg, Rectal, Q4H PRN, Opyd, Evalene RAMAN, MD   aspirin  EC tablet 81 mg, 81 mg, Oral, Daily, Litha Lamartina, MD, 81 mg at 06/18/24 0919   atorvastatin  (LIPITOR) tablet 20 mg, 20 mg, Oral, Daily, Opyd, Timothy  S, MD, 20 mg at 06/18/24 0919   clopidogrel  (PLAVIX ) tablet 75 mg, 75 mg, Oral, Daily, Moriya Mitchell, MD, 75 mg at 06/18/24 9080   enoxaparin  (LOVENOX ) injection 40 mg, 40 mg, Subcutaneous, Q24H, Opyd, Timothy S, MD, 40 mg at 06/18/24 0919   finasteride  (PROSCAR ) tablet 5 mg, 5 mg, Oral, Daily, Opyd, Timothy S, MD, 5 mg at 06/18/24 0919   senna-docusate (Senokot-S) tablet 1 tablet, 1 tablet, Oral, QHS PRN, Opyd, Evalene RAMAN, MD   tamsulosin  (FLOMAX ) capsule 0.4 mg, 0.4 mg, Oral, Daily, Opyd, Timothy S, MD, 0.4 mg at 06/18/24 0919  Current Outpatient Medications:    atorvastatin  (LIPITOR) 20 MG tablet, TAKE 1 TABLET BY MOUTH EVERY DAY, Disp: 90 tablet, Rfl: 1   augmented betamethasone dipropionate (DIPROLENE-AF) 0.05 % cream, Apply 1 Application topically daily as needed (itching/rash.)., Disp: , Rfl:    carbamide peroxide (DEBROX) 6.5 % OTIC solution, Place 5 drops into both ears 2 (two) times daily. (Patient taking differently: Place 5 drops into both ears as needed (Ear wax build up).), Disp: 15 mL, Rfl: 0   cyanocobalamin  (VITAMIN B12) 1000 MCG tablet, Take 1 tablet (1,000 mcg total) by mouth daily., Disp: 90 tablet, Rfl: 2   empagliflozin  (JARDIANCE ) 25 MG TABS tablet, Take 1 tablet (25 mg total) by mouth daily before breakfast., Disp: 90 tablet, Rfl: 1   finasteride  (PROSCAR ) 5 MG tablet, TAKE 1 TABLET (5 MG TOTAL) BY MOUTH DAILY., Disp: 90 tablet, Rfl: 1   Multiple Vitamin (MULTIVITAMIN WITH MINERALS) TABS tablet, Take 1 tablet by mouth in the morning. One A Day, Disp: , Rfl:    Olopatadine  HCl (PATADAY ) 0.2 % SOLN, Place 1 drop into both eyes 1 day or 1 dose., Disp: 2.5 mL, Rfl: 5   tamsulosin  (FLOMAX ) 0.4 MG CAPS capsule, Take 1 capsule (0.4 mg total) by mouth daily., Disp: 30 capsule, Rfl: 5  Vitals   Vitals:   06/18/24 0645 06/18/24 0945 06/18/24 1030 06/18/24 1110  BP:    125/64  Pulse: (!) 123   60  Resp:    18  Temp:  98 F (36.7 C) 98.1 F (36.7 C) 97.6 F (36.4 C)   TempSrc:  Oral Oral Oral  SpO2: 100%   94%    There is no height or weight on file to calculate BMI.   Physical Exam   Constitutional: Appears well-developed and well-nourished.  Psych: Affect appropriate to situation.  Eyes: No scleral injection.  HENT: No OP obstruction.  Head: Normocephalic.  Respiratory: Effort  normal, non-labored breathing.  Skin: WDI.   Neurologic Examination    NEURO:  Mental Status: AA&Ox3  Speech/Language: speech is without dysarthria or aphasia.  Naming, repetition, fluency, and comprehension intact.  Cranial Nerves:  II: PERRL. Visual fields full.  III, IV, VI: EOMI. Eyelids elevate symmetrically.  V: Sensation is intact to light touch and symmetrical to face.  VII: Smile is symmetrical.  VIII: hearing intact to voice. IX, X: Phonation is normal.  KP:Dynloizm shrug 5/5. XII: tongue is midline without fasciculations. Motor: 5/5 strength to all muscle groups tested.  Tone: is normal and bulk is normal Sensation- Intact to light touch bilaterally. Extinction absent to light touch to DSS.  Coordination: FTN intact bilaterally, HKS: no ataxia in BLE. Gait- deferred   Labs/Imaging/Neurodiagnostic studies   CBC:  Recent Labs  Lab Jun 22, 2024 1920 06/18/24 0551  WBC 9.9 7.9  NEUTROABS 7.8*  --   HGB 15.4 14.0  HCT 49.1 45.9  MCV 96.1 96.0  PLT 214 189   Basic Metabolic Panel:  Lab Results  Component Value Date   NA 141 06/18/2024   K 3.9 06/18/2024   CO2 27 06/18/2024   GLUCOSE 86 06/18/2024   BUN 13 06/18/2024   CREATININE 0.81 06/18/2024   CALCIUM  9.1 06/18/2024   GFRNONAA >60 06/18/2024   Lipid Panel:  Lab Results  Component Value Date   LDLCALC 58 06/18/2024   HgbA1c:  Lab Results  Component Value Date   HGBA1C 5.6 06/18/2024   Urine Drug Screen:     Component Value Date/Time   LABOPIA NEGATIVE 06-22-24 1948   COCAINSCRNUR NEGATIVE Jun 22, 2024 1948   LABBENZ NEGATIVE 2024/06/22 1948   AMPHETMU NEGATIVE 06/22/2024  1948   THCU NEGATIVE 2024-06-22 1948   LABBARB NEGATIVE 06/22/2024 1948    Alcohol Level     Component Value Date/Time   ETH <15 06/22/2024 1920   INR  Lab Results  Component Value Date   INR 0.9 22-Jun-2024   CT Head without contrast(Personally reviewed): No acute abnormality  CT angio Head and Neck with contrast(Personally reviewed): No LVO, 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 male with hx of BPH, hyperlipidemia, PACs and ILD who presents with an episode of disequilibrium and horizontal diplopia.  Patient reports that symptoms largely resolved after about 45 minutes but that some slight horizontal diplopia remains.  MRI is negative for acute infarct, but MRI negative small brainstem stroke could explain continuation of symptoms.  Stroke workup is being performed, with LDL of 58 and A1c of 5.6, no LVO seen on CTA head and neck and echocardiogram still pending.  Would keep patient on aspirin  81 mg daily and Plavix  75 mg daily for 3 weeks followed by aspirin  alone indefinitely.  RECOMMENDATIONS  -Complete stroke workup, then patient may be discharged - TTE - Continue atorvastatin  20 mg daily, no need for high intensity statin as LDL is within goal - Aspirin  81 mg daily and Plavix  75 mg daily antiplt/anticoag for 3 weeks followed by aspirin  alone - q4 hr neuro checks - STAT head CT for any change in neuro exam - Tele - PT/OT/SLP - Stroke education - Amb referral to neurology upon discharge, follow-up in stroke clinic in 6 to 8 weeks ______________________________________________________________________  Patient seen by NP and then by MD, MD to edit note as needed.  Signed, Cortney E Everitt Mayers  [place], NP [facility]  NEUROHOSPITALIST ADDENDUM Performed a face to face diagnostic evaluation.   I have reviewed the contents of  history and physical exam as documented by PA/ARNP/Resident and agree with above documentation.  I have discussed and formulated the above plan as documented. Edits to the note have been made as needed.  Impression/Key exam findings/Plan: sudden onset diplopia with vertigo. Diplopia improved after 45 mins but still slight residual subjective diplopia this AM. 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  DAPT x 21 days, follow up with outpatient neurology. HbA1c is not consistent with DM2, LDL is below 70 and therefore, continue home Atorvastatin  20mg  daily. TTE is being done at the bedside and if it is negative, can be discharged home with outpatient neurology follow up.  Chelse Matas, MD [facility] [phone]   If 7pm to 7am, please call on call as listed on AMION.

## 2024-06-18 NOTE — ED Notes (Signed)
Pt resting with no needs at this time.

## 2024-06-18 NOTE — ED Notes (Signed)
 Pt eating apple sauce, breakfast sandwich, and drinking apple juice independently without issue.

## 2024-06-18 NOTE — Progress Notes (Deleted)
   06/18/24 1549  TOC Brief Assessment  Insurance and Status Reviewed  Patient has primary care physician Yes Yvette, Elsie Sayre, MD)  Home environment has been reviewed Home  Prior level of function: Independent  Prior/Current Home Services No current home services  Social Drivers of Health Review SDOH reviewed no interventions necessary  Readmission risk has been reviewed Yes  Transition of care needs no transition of care needs at this time

## 2024-06-18 NOTE — Evaluation (Signed)
 Physical Therapy Evaluation Patient Details Name: Gerald Shelton MRN: [id number] DOB: 07-May-1941 Today's Date: 06/18/2024  History of Present Illness  Gerald Shelton is an 83 y.o. male presents to UC, sent to Ed 06/17/24 with visual deficits and balance difficulties.Medical history significant for BPH, hyperlipidemia, and ILD. MRI- No acute intracranial abnormality.Moderate chronic cerebral white matter signal changes and mild  chronic signal changes in the right pons, most commonly due to  chronic small vessel disease.  Clinical Impression   Pt admitted with above diagnosis.  Pt currently with functional limitations due to the deficits listed below (see PT Problem List). Pt will benefit from acute skilled PT to increase their independence and safety with mobility to allow discharge.       The patient   reports no dizziness when mobilizing, noted with balance loss upon standing and during ambulation with turns and stops. Patient reports no dizziness , Strength WFL, coordination WFL,  difficulty with unilateral stance, bilateral.  Patient does reports recent UC visit for clogged ear canal.  Recommend further  evaluation for vestibular component causing initial onset of vertigo symptoms.  Incidentally, PT returned to patient's room about 1 hour later and patient now reports noted dizziness when he  turns his head   to the right. PT will further assess  vestibular  later this AM.  Patient may  benefit from OPPT, will  await vestibular assessment for confirmed F/U PT.    If plan is discharge home, recommend the following: A little help with walking and/or transfers;Assistance with cooking/housework;Assist for transportation;Help with stairs or ramp for entrance;A little help with bathing/dressing/bathroom   Can travel by private vehicle        Equipment Recommendations None recommended by PT  Recommendations for Other Services       Functional Status Assessment Patient has had a recent decline in  their functional status and demonstrates the ability to make significant improvements in function in a reasonable and predictable amount of time.     Precautions / Restrictions Precautions Precautions: Fall Restrictions Weight Bearing Restrictions Per Provider Order: No      Mobility  Bed Mobility Overal bed mobility: Independent                  Transfers Overall transfer level: Needs assistance   Transfers: Sit to/from Stand Sit to Stand: Supervision           General transfer comment: noted slight balance  loss upon standing, steady assist required    Ambulation/Gait Ambulation/Gait assistance: Contact guard assist Gait Distance (Feet): 300 Feet Assistive device: None Gait Pattern/deviations: Step-through pattern, Drifts right/left   Gait velocity interpretation: <1.31 ft/sec, indicative of household ambulator   General Gait Details: noted intermittent drift  when sharp turns during ambulation, does not report any spinning nor dizziness.  Stairs            Wheelchair Mobility     Tilt Bed    Modified Rankin (Stroke Patients Only) Modified Rankin (Stroke Patients Only) Pre-Morbid Rankin Score: No symptoms Modified Rankin: No significant disability     Balance Overall balance assessment: Needs assistance   Sitting balance-Leahy Scale: Normal     Standing balance support: No upper extremity supported Standing balance-Leahy Scale: Fair Standing balance comment: good static, fair/poor dynamic Single Leg Stance - Right Leg: 3 Single Leg Stance - Left Leg:  (6) Tandem Stance - Right Leg:  (5) Tandem Stance - Left Leg: 5     High level balance activites: Direction  changes, Turns, Head turns High Level Balance Comments: noted drifts when head turns, sharp turns, able to ccatch self but noted  loos of balance             Pertinent Vitals/Pain Pain Assessment Pain Assessment: No/denies pain    Home Living Family/patient expects to  be discharged to:: Private residence Living Arrangements: Spouse/significant other Available Help at Discharge: Family Type of Home: House Home Access: Stairs to enter Entrance Stairs-Rails: Left Entrance Stairs-Number of Steps: 4   Home Layout: One level Home Equipment: None      Prior Function Prior Level of Function : Independent/Modified Independent;Working/employed;Driving             Mobility Comments: raises herding dogs       Extremity/Trunk Assessment   Upper Extremity Assessment Upper Extremity Assessment: Defer to OT evaluation    Lower Extremity Assessment Lower Extremity Assessment: Overall WFL for tasks assessed    Cervical / Trunk Assessment Cervical / Trunk Assessment: Normal  Communication   Communication Communication: No apparent difficulties    Cognition Arousal: Alert Behavior During Therapy: WFL for tasks assessed/performed   PT - Cognitive impairments: No apparent impairments                         Following commands: Intact       Cueing       General Comments      Exercises     Assessment/Plan    PT Assessment Patient needs continued PT services  PT Problem List Decreased strength;Decreased knowledge of use of DME;Decreased activity tolerance;Decreased safety awareness;Decreased balance;Decreased knowledge of precautions;Decreased mobility       PT Treatment Interventions Gait training;Functional mobility training;Therapeutic activities;Patient/family education    PT Goals (Current goals can be found in the Care Plan section)  Acute Rehab PT Goals Patient Stated Goal: go home PT Goal Formulation: With patient Time For Goal Achievement: 07/02/24 Potential to Achieve Goals: Good    Frequency Min 3X/week     Co-evaluation               AM-PAC PT 6 Clicks Mobility  Outcome Measure Help needed turning from your back to your side while in a flat bed without using bedrails?: None Help needed moving  from lying on your back to sitting on the side of a flat bed without using bedrails?: None Help needed moving to and from a bed to a chair (including a wheelchair)?: A Little Help needed standing up from a chair using your arms (e.g., wheelchair or bedside chair)?: A Little Help needed to walk in hospital room?: A Little Help needed climbing 3-5 steps with a railing? : A Little 6 Click Score: 20    End of Session Equipment Utilized During Treatment: Gait belt Activity Tolerance: Patient tolerated treatment well Patient left: in chair;with call bell/phone within reach;with nursing/sitter in room Nurse Communication: Mobility status PT Visit Diagnosis: Unsteadiness on feet (R26.81);Dizziness and giddiness (R42)    Time: 9178-9158 PT Time Calculation (min) (ACUTE ONLY): 20 min   Charges:   PT Evaluation $PT Eval Low Complexity: 1 Low   PT General Charges $$ ACUTE PT VISIT: 1 Visit         Darice Potters PT Acute Rehabilitation Services Office [phone]   Potters Darice Norris 06/18/2024, 10:13 AM

## 2024-06-18 NOTE — Hospital Course (Signed)
 Gerald Shelton is an 83 y.o. male with medical history significant for BPH, hyperlipidemia, and ILD who presented after an episode of visual disturbance and difficulty with balance.   Patient reports that he was in his usual state of health and having an uneventful day when he developed diplopia and disequilibrium at around 4 PM.  Symptoms completely resolved after approximately 45 minutes and he reports feeling back to his usual self on arrival to ER.  There was no associated focal numbness, focal weakness, chest pain, headache, or palpitations.  He has not experienced this previously.   ED Course: Upon arrival to the ED, patient is found to be afebrile and saturating mid 90s on room air with normal HR and stable BP.  CMP and CBC are normal.  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  CTA of the head and neck is negative for large vessel occlusion or hemodynamically significant stenosis.  MRI brain was also obtained which was negative for acute stroke.  Moderate chronic cerebral white matter changes noted otherwise no other acute findings. Echo also performed and negative for clots.  EF 65 to 70%, no RWMA, mild LVH.  Grade 1 diastolic dysfunction. A1c 5.6%.  LDL 58.  UDS and ethanol negative. He was not on aspirin  or any other antiplatelets prior to admission. He was evaluated by neurology, PT, OT. He was started on aspirin  and Plavix  for 21 days followed by monotherapy aspirin .  Outpatient follow-up with neurology also planned.  He was recommended for vestibular PT at discharge also after PT evaluation.

## 2024-06-18 NOTE — Discharge Summary (Signed)
 Physician Discharge Summary   Gerald Shelton FMW:[id number] DOB: May 31, 1941 DOA: 06/17/2024  PCP: Berneta Elsie Sayre, MD  Admit date: 06/17/2024 Discharge date: 06/18/2024  Admitted From: Home Disposition:  Home Discharging physician: Alm Apo, MD Barriers to discharge: none  Recommendations at discharge: Follow up with neurology Referred to outpt vestibular PT   Discharge Condition: stable CODE STATUS: Full  Diet recommendation:  Diet Orders (From admission, onward)     Start     Ordered   06/18/24 0909  Diet regular Fluid consistency: Thin  Diet effective now       Question:  Fluid consistency:  Answer:  Thin   06/18/24 0909   06/18/24 0000  Diet general        06/18/24 1707            Hospital Course: Gerald Shelton is an 83 y.o. male with medical history significant for BPH, hyperlipidemia, and ILD who presented after an episode of visual disturbance and difficulty with balance.   Patient reports that he was in his usual state of health and having an uneventful day when he developed diplopia and disequilibrium at around 4 PM.  Symptoms completely resolved after approximately 45 minutes and he reports feeling back to his usual self on arrival to ER.  There was no associated focal numbness, focal weakness, chest pain, headache, or palpitations.  He has not experienced this previously.   ED Course: Upon arrival to the ED, patient is found to be afebrile and saturating mid 90s on room air with normal HR and stable BP.  CMP and CBC are normal.  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  CTA of the head and neck is negative for large vessel occlusion or hemodynamically significant stenosis.  MRI brain was also obtained which was negative for acute stroke.  Moderate chronic cerebral white matter changes noted otherwise no other acute findings. Echo also performed and negative for clots.  EF 65 to 70%, no RWMA, mild LVH.   Grade 1 diastolic dysfunction. A1c 5.6%.  LDL 58.  UDS and ethanol negative. He was not on aspirin  or any other antiplatelets prior to admission. He was evaluated by neurology, PT, OT. He was started on aspirin  and Plavix  for 21 days followed by monotherapy aspirin .  Outpatient follow-up with neurology also planned.  He was recommended for vestibular PT at discharge also after PT evaluation.   The patient's acute and chronic medical conditions were treated accordingly. On day of discharge, patient was felt deemed stable for discharge. Patient/family member advised to call PCP or come back to ER if needed.   Principal Diagnosis: Transient visual disturbance  Discharge Diagnoses: Active Hospital Problems   Diagnosis Date Noted   Transient visual disturbance 06/17/2024    Priority: 2.   TIA (transient ischemic attack) 06/18/2024    Priority: 1.   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HCC) 07/04/2022   Mixed hyperlipidemia 08/14/2018    Resolved Hospital Problems  No resolved problems to display.     Discharge Instructions     Diet general   Complete by: As directed    Increase activity slowly   Complete by: As directed       Allergies as of 06/18/2024       Reactions   Augmentin [amoxicillin-pot Clavulanate] Other (See Comments)   GI Upset   Metformin  And Related Other (See Comments)   Cramps with loose stool   Zocor [simvastatin] Other (See Comments)  GI Upset        Medication List     TAKE these medications    aspirin  EC 81 MG tablet Take 1 tablet (81 mg total) by mouth daily. Swallow whole. Start taking on: June 19, 2024   atorvastatin  20 MG tablet Commonly known as: LIPITOR TAKE 1 TABLET BY MOUTH EVERY DAY   augmented betamethasone dipropionate 0.05 % cream Commonly known as: DIPROLENE-AF Apply 1 Application topically daily as needed (itching/rash.).   carbamide peroxide 6.5 % OTIC solution Commonly known as: DEBROX Place 5 drops into both ears 2 (two)  times daily. What changed:  when to take this reasons to take this   clopidogrel  75 MG tablet Commonly known as: PLAVIX  Take 1 tablet (75 mg total) by mouth daily for 20 days. Start taking on: June 19, 2024   cyanocobalamin  1000 MCG tablet Commonly known as: VITAMIN B12 Take 1 tablet (1,000 mcg total) by mouth daily.   empagliflozin  25 MG Tabs tablet Commonly known as: Jardiance  Take 1 tablet (25 mg total) by mouth daily before breakfast.   finasteride  5 MG tablet Commonly known as: PROSCAR  TAKE 1 TABLET (5 MG TOTAL) BY MOUTH DAILY.   multivitamin with minerals Tabs tablet Take 1 tablet by mouth in the morning. One A Day   Olopatadine  HCl 0.2 % Soln Commonly known as: Pataday  Place 1 drop into both eyes 1 day or 1 dose.   tamsulosin  0.4 MG Caps capsule Commonly known as: FLOMAX  Take 1 capsule (0.4 mg total) by mouth daily.        Allergies  Allergen Reactions   Augmentin [Amoxicillin-Pot Clavulanate] Other (See Comments)    GI Upset   Metformin  And Related Other (See Comments)    Cramps with loose stool    Zocor [Simvastatin] Other (See Comments)    GI Upset    Consultations: Neurology  Procedures:   Discharge Exam: BP 137/77 (BP Location: Right Arm)   Pulse 64   Temp 98.5 F (36.9 C) (Oral)   Resp 18   Ht 5' 10 (1.778 m)   Wt 90.1 kg   SpO2 94%   BMI 28.51 kg/m  Physical Exam Constitutional:      General: He is not in acute distress.    Appearance: Normal appearance.  HENT:     Head: Normocephalic and atraumatic.     Mouth/Throat:     Mouth: Mucous membranes are moist.  Eyes:     Extraocular Movements: Extraocular movements intact.  Cardiovascular:     Rate and Rhythm: Normal rate and regular rhythm.  Pulmonary:     Effort: Pulmonary effort is normal. No respiratory distress.     Breath sounds: Normal breath sounds. No wheezing.  Abdominal:     General: Bowel sounds are normal. There is no distension.     Palpations: Abdomen is  soft.     Tenderness: There is no abdominal tenderness.  Musculoskeletal:        General: Normal range of motion.     Cervical back: Normal range of motion and neck supple.  Skin:    General: Skin is warm and dry.  Neurological:     General: No focal deficit present.     Mental Status: He is alert.  Psychiatric:        Mood and Affect: Mood normal.        Behavior: Behavior normal.      The results of significant diagnostics from this hospitalization (including imaging, microbiology, ancillary and laboratory)  are listed below for reference.   Microbiology: No results found for this or any previous visit (from the past 240 hours).   Labs: BNP (last 3 results) No results for input(s): BNP in the last 8760 hours. Basic Metabolic Panel: Recent Labs  Lab 06/17/24 1920 06/18/24 0551  NA 143 141  K 4.2 3.9  CL 106 106  CO2 26 27  GLUCOSE 117* 86  BUN 18 13  CREATININE 0.95 0.81  CALCIUM  9.4 9.1   Liver Function Tests: Recent Labs  Lab 06/17/24 1920  AST 26  ALT 20  ALKPHOS 94  BILITOT 0.3  PROT 6.9  ALBUMIN 4.2   No results for input(s): LIPASE, AMYLASE in the last 168 hours. No results for input(s): AMMONIA in the last 168 hours. CBC: Recent Labs  Lab 06/17/24 1920 06/18/24 0551  WBC 9.9 7.9  NEUTROABS 7.8*  --   HGB 15.4 14.0  HCT 49.1 45.9  MCV 96.1 96.0  PLT 214 189   Cardiac Enzymes: No results for input(s): CKTOTAL, CKMB, CKMBINDEX, TROPONINI in the last 168 hours. BNP: Invalid input(s): POCBNP CBG: No results for input(s): GLUCAP in the last 168 hours. D-Dimer No results for input(s): DDIMER in the last 72 hours. Hgb A1c Recent Labs    06/18/24 0551  HGBA1C 5.6   Lipid Profile Recent Labs    06/18/24 0551  CHOL 126  HDL 44  LDLCALC 58  TRIG 119  CHOLHDL 2.9   Thyroid  function studies No results for input(s): TSH, T4TOTAL, T3FREE, THYROIDAB in the last 72 hours.  Invalid input(s): FREET3 Anemia  work up No results for input(s): VITAMINB12, FOLATE, FERRITIN, TIBC, IRON, RETICCTPCT in the last 72 hours. Urinalysis    Component Value Date/Time   COLORURINE YELLOW 11/21/2023 1058   APPEARANCEUR CLEAR 11/21/2023 1058   LABSPEC 1.020 11/21/2023 1058   PHURINE 6.0 11/21/2023 1058   GLUCOSEU >=1000 (A) 11/21/2023 1058   HGBUR NEGATIVE 11/21/2023 1058   BILIRUBINUR NEGATIVE 11/21/2023 1058   KETONESUR NEGATIVE 11/21/2023 1058   PROTEINUR NEGATIVE 07/03/2022 1105   UROBILINOGEN 0.2 11/21/2023 1058   NITRITE NEGATIVE 11/21/2023 1058   LEUKOCYTESUR NEGATIVE 11/21/2023 1058   Sepsis Labs Recent Labs  Lab 06/17/24 1920 06/18/24 0551  WBC 9.9 7.9   Microbiology No results found for this or any previous visit (from the past 240 hours).  Procedures/Studies: ECHOCARDIOGRAM COMPLETE Result Date: 06/18/2024    ECHOCARDIOGRAM REPORT   Patient Name:   Gerald Shelton Date of Exam: 06/18/2024 Medical Rec #:  [id number]   Height:       70.0 in Accession #:    [id number]  Weight:       203.0 lb Date of Birth:  1941/04/30   BSA:          2.101 m Patient Age:    83 years    BP:           125/64 mmHg Patient Gender: M           HR:           69 bpm. Exam Location:  Inpatient Procedure: 2D Echo (Both Spectral and Color Flow Doppler were utilized during            procedure). Indications:    TIA  History:        Patient has prior history of Echocardiogram examinations and                 Patient has no prior history of  Echocardiogram examinations.                 Risk Factors:Dyslipidemia.  Sonographer:    Therisa Crouch Referring Phys: [id number] TIMOTHY S OPYD IMPRESSIONS  1. Left ventricular ejection fraction, by estimation, is 65 to 70%. The left ventricle has normal function. The left ventricle has no regional wall motion abnormalities. There is mild concentric left ventricular hypertrophy. Left ventricular diastolic parameters are consistent with Grade I diastolic dysfunction (impaired relaxation).  2.  Right ventricular systolic function is normal. The right ventricular size is normal. There is mildly elevated pulmonary artery systolic pressure. The estimated right ventricular systolic pressure is 44.0 mmHg.  3. Left atrial size was moderately dilated.  4. The mitral valve is normal in structure. No evidence of mitral valve regurgitation. No evidence of mitral stenosis.  5. The aortic valve is tricuspid. There is mild calcification of the aortic valve. Aortic valve regurgitation is not visualized. Aortic valve sclerosis is present, with no evidence of aortic valve stenosis.  6. The inferior vena cava is dilated in size with >50% respiratory variability, suggesting right atrial pressure of 8 mmHg. FINDINGS  Left Ventricle: Left ventricular ejection fraction, by estimation, is 65 to 70%. The left ventricle has normal function. The left ventricle has no regional wall motion abnormalities. Definity  contrast agent was given IV to delineate the left ventricular  endocardial borders. The left ventricular internal cavity size was normal in size. There is mild concentric left ventricular hypertrophy. Left ventricular diastolic parameters are consistent with Grade I diastolic dysfunction (impaired relaxation). Indeterminate filling pressures. Right Ventricle: The right ventricular size is normal. Right vetricular wall thickness was not well visualized. Right ventricular systolic function is normal. There is mildly elevated pulmonary artery systolic pressure. The tricuspid regurgitant velocity  is 3.00 m/s, and with an assumed right atrial pressure of 8 mmHg, the estimated right ventricular systolic pressure is 44.0 mmHg. Left Atrium: Left atrial size was moderately dilated. Right Atrium: Right atrial size was normal in size. Pericardium: There is no evidence of pericardial effusion. Mitral Valve: The mitral valve is normal in structure. Mild mitral annular calcification. No evidence of mitral valve regurgitation. No  evidence of mitral valve stenosis. Tricuspid Valve: The tricuspid valve is normal in structure. Tricuspid valve regurgitation is trivial. Aortic Valve: The aortic valve is tricuspid. There is mild calcification of the aortic valve. Aortic valve regurgitation is not visualized. Aortic valve sclerosis is present, with no evidence of aortic valve stenosis. Aortic valve mean gradient measures 6.2 mmHg. Aortic valve peak gradient measures 12.3 mmHg. Aortic valve area, by VTI measures 2.73 cm. Pulmonic Valve: The pulmonic valve was grossly normal. Pulmonic valve regurgitation is not visualized. No evidence of pulmonic stenosis. Aorta: The aortic root and ascending aorta are structurally normal, with no evidence of dilitation. Venous: The inferior vena cava is dilated in size with greater than 50% respiratory variability, suggesting right atrial pressure of 8 mmHg. IAS/Shunts: The interatrial septum was not well visualized.  LEFT VENTRICLE PLAX 2D LVIDd:         4.40 cm   Diastology LVIDs:         3.10 cm   LV e' medial:    5.22 cm/s LV PW:         1.30 cm   LV E/e' medial:  13.6 LV IVS:        1.40 cm   LV e' lateral:   5.55 cm/s LVOT diam:     2.00 cm  LV E/e' lateral: 12.8 LV SV:         81 LV SV Index:   39 LVOT Area:     3.14 cm  IVC IVC diam: 1.75 cm LEFT ATRIUM             Index LA diam:        4.90 cm 2.33 cm/m LA Vol (A2C):   46.4 ml 22.09 ml/m LA Vol (A4C):   42.4 ml 20.18 ml/m LA Biplane Vol: 46.1 ml 21.95 ml/m  AORTIC VALVE AV Area (Vmax):    2.34 cm AV Area (Vmean):   2.19 cm AV Area (VTI):     2.73 cm AV Vmax:           175.10 cm/s AV Vmean:          114.634 cm/s AV VTI:            0.299 m AV Peak Grad:      12.3 mmHg AV Mean Grad:      6.2 mmHg LVOT Vmax:         130.18 cm/s LVOT Vmean:        79.983 cm/s LVOT VTI:          0.259 m LVOT/AV VTI ratio: 0.87  AORTA Ao Root diam: 3.10 cm Ao Asc diam:  3.40 cm MITRAL VALVE               TRICUSPID VALVE MV Area (PHT): 2.99 cm    TR Peak grad:   36.0  mmHg MV Decel Time: 254 msec    TR Vmax:        300.00 cm/s MV E velocity: 71.10 cm/s MV A velocity: 95.50 cm/s  SHUNTS MV E/A ratio:  0.74        Systemic VTI:  0.26 m                            Systemic Diam: 2.00 cm Jerel Croitoru MD Electronically signed by Jerel Balding MD Signature Date/Time: 06/18/2024/4:57:10 PM    Final    MR BRAIN WO CONTRAST Result Date: 06/18/2024 CLINICAL DATA:  83 year old male with transient loss of vision in the left eye. TIA. EXAM: MRI HEAD WITHOUT CONTRAST TECHNIQUE: Multiplanar, multiecho pulse sequences of the brain and surrounding structures were obtained without intravenous contrast. COMPARISON:  CT head and CTA head and neck yesterday. FINDINGS: Brain: No restricted diffusion to suggest acute infarction. No midline shift, mass effect, evidence of mass lesion, ventriculomegaly, extra-axial collection or acute intracranial hemorrhage. Cervicomedullary junction and pituitary are within normal limits. Patchy and confluent bilateral cerebral white matter T2 and FLAIR hyperintensity, moderate. Subcortical anterior superior left temporal lobe involvement plus perivascular spaces (series 9, image 21). No convincing cortical encephalomalacia. And no convincing chronic cerebral blood products on SWI. Pulsation artifact in the bilateral basal ganglia on FLAIR imaging. Deep gray nuclei are normal for age. Mild heterogeneity in the central right pons on T1 and T2. Brainstem and cerebellum otherwise negative. Vascular: Major intracranial vascular flow voids are preserved. Skull and upper cervical spine: Negative. Visualized bone marrow signal is within normal limits. Sinuses/Orbits: Normal suprasellar cistern. Noncontrast optic chiasm, cavernous sinus appear normal. Symmetric and negative orbits soft tissues aside from postoperative changes to the globes. Paranasal sinuses and mastoids are well aerated; maxillary alveolar recess mucosal thickening on the right. Other: Visible internal  auditory structures appear normal. Negative visible scalp and face. IMPRESSION: 1. No acute intracranial  abnormality. 2. 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 Electronically Signed   By: VEAR Hurst M.D.   On: 06/18/2024 07:14   CT ANGIO HEAD NECK W WO CM Result Date: 06/18/2024 CLINICAL DATA:  Follow-up examination for stroke. EXAM: CT ANGIOGRAPHY HEAD AND NECK WITH AND WITHOUT CONTRAST TECHNIQUE: Multidetector CT imaging of the head and neck was performed using the standard protocol during bolus administration of intravenous contrast. Multiplanar CT image reconstructions and MIPs were obtained to evaluate the vascular anatomy. Carotid stenosis measurements (when applicable) are obtained utilizing NASCET criteria, using the distal internal carotid diameter as the denominator. RADIATION DOSE REDUCTION: This exam was performed according to the departmental dose-optimization program which includes automated exposure control, adjustment of the mA and/or kV according to patient size and/or use of iterative reconstruction technique. CONTRAST:  75mL OMNIPAQUE  IOHEXOL  350 MG/ML SOLN COMPARISON:  CT from earlier the same day. FINDINGS: CTA NECK FINDINGS Aortic arch: Visualized arch within normal limits for caliber with standard branch pattern. Aortic atherosclerosis. No significant stenosis about the origin the great vessels. Right carotid system: Right common and internal carotid arteries are patent without dissection. Mild atheromatous change about the right carotid bulb without hemodynamically significant greater than 50% stenosis. Left carotid system: Left common and internal carotid arteries are patent without dissection. Mild atheromatous change about the left carotid bulb without hemodynamically significant greater than 50% stenosis. Vertebral arteries: Both vertebral arteries arise from subclavian  arteries. No significant proximal subclavian artery stenosis. Vertebral arteries are patent without stenosis or dissection. Skeleton: No worrisome osseous lesions. Patient is edentulous. Other neck: No other acute finding. Upper chest: Emphysema. No other acute finding. Review of the MIP images confirms the above findings CTA HEAD FINDINGS Anterior circulation: Mild atheromatous change about the carotid siphons without hemodynamically significant stenosis. A1 segments, anterior communicating artery complex, and anterior cerebral arteries widely patent without stenosis. No M1 stenosis or occlusion. No proximal MCA branch occlusion. Distal MCA branches perfused and symmetric. Posterior circulation: Both V4 segments patent without stenosis. Both PICA patent. Basilar patent without stenosis. Superior cerebral arteries patent bilaterally. 3 mm aneurysm seen at the origin of the left SCA (series 9, image 185). Both PCAs primarily supplied via the basilar. PCAs patent without significant stenosis. Venous sinuses: Patent allowing for timing the contrast bolus. Anatomic variants: None significant. Review of the MIP images confirms the above findings IMPRESSION: 1. Negative CTA for large vessel occlusion or other emergent finding. 2. Mild atheromatous change about the carotid bifurcations and carotid siphons without hemodynamically significant stenosis. 3. 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 Aortic Atherosclerosis (ICD10-I70.0) and Emphysema (ICD10-J43.9). Electronically Signed   By: Morene Hoard M.D.   On: 06/18/2024 00:00   CT HEAD WO CONTRAST Result Date: 06/17/2024 EXAM: CT HEAD WITHOUT CONTRAST 06/17/2024 07:31:21 PM TECHNIQUE: CT of the head was performed without the administration of intravenous contrast. Automated exposure control, iterative reconstruction, and/or weight based adjustment of the mA/kV was utilized to reduce the radiation dose to as low as reasonably achievable. COMPARISON: None  available. CLINICAL HISTORY: Transient ischemic attack (TIA). 45 minute episode of vision trouble with left eye. Has since resolved. No history of stroke. FINDINGS: BRAIN AND VENTRICLES: Focus of hypoattenuation within the anterior left temporal lobe (series 2 image 9) is presumed chronic though age indeterminate without comparison. If there is ongoing concern for acute stroke, MRI is recommended.  Chronic microvascular ischemia and generalized atrophy. No acute hemorrhage. No hydrocephalus. No extra-axial collection. No mass effect or midline shift. ORBITS: No acute abnormality. SINUSES: No acute abnormality. SOFT TISSUES AND SKULL: No acute soft tissue abnormality. No skull fracture. IMPRESSION: 1. 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 If there is ongoing concern for acute stroke, MRI is recommended. Electronically signed by: Norman Gatlin MD 06/17/2024 07:41 PM EDT RP Workstation: [id number]     Time coordinating discharge: Over 30 minutes    Alm Apo, MD  [facility] 06/18/2024, 5:35 PM

## 2024-06-18 NOTE — Progress Notes (Signed)
   06/18/24 1549  TOC Brief Assessment  Insurance and Status Reviewed  Patient has primary care physician Yes Gerald Shelton, Gerald Sayre, MD)  Home environment has been reviewed Home  Prior level of function: Independent  Prior/Current Home Services No current home services  Social Drivers of Health Review SDOH reviewed no interventions necessary  Readmission risk has been reviewed Yes  Transition of care needs no transition of care needs at this time

## 2024-06-18 NOTE — ED Notes (Signed)
 Pt ambulating in hallway with Physical Therapy.

## 2024-06-25 ENCOUNTER — Ambulatory Visit (INDEPENDENT_AMBULATORY_CARE_PROVIDER_SITE_OTHER): Admitting: Family Medicine

## 2024-06-25 ENCOUNTER — Other Ambulatory Visit (HOSPITAL_COMMUNITY): Payer: Self-pay

## 2024-06-25 ENCOUNTER — Encounter (HOSPITAL_COMMUNITY): Payer: Self-pay

## 2024-06-25 ENCOUNTER — Encounter: Payer: Self-pay | Admitting: Family Medicine

## 2024-06-25 ENCOUNTER — Other Ambulatory Visit: Payer: Self-pay | Admitting: Family Medicine

## 2024-06-25 VITALS — BP 116/68 | HR 70 | Temp 97.6°F | Ht 70.0 in | Wt 200.8 lb

## 2024-06-25 DIAGNOSIS — Z8669 Personal history of other diseases of the nervous system and sense organs: Secondary | ICD-10-CM | POA: Diagnosis not present

## 2024-06-25 DIAGNOSIS — Z23 Encounter for immunization: Secondary | ICD-10-CM | POA: Diagnosis not present

## 2024-06-25 DIAGNOSIS — H6123 Impacted cerumen, bilateral: Secondary | ICD-10-CM | POA: Diagnosis not present

## 2024-06-25 DIAGNOSIS — Z09 Encounter for follow-up examination after completed treatment for conditions other than malignant neoplasm: Secondary | ICD-10-CM

## 2024-06-25 NOTE — Progress Notes (Signed)
 Established Patient Office Visit   Subjective:  Patient ID: Gerald Shelton, male    DOB: 03-Sep-1941  Age: 83 y.o. MRN: [id number]  Chief Complaint  Patient presents with   Follow-up    Pt was in the ER with possible TIA. Pt states double vision prompt him to go to the ER. Pt states he feels much better.     HPI Encounter Diagnoses  Name Primary?   Hospital discharge follow-up Yes   Immunization due    History of diplopia    Bilateral impacted cerumen    Back on the eighth of this month patient developed diplopia with lateral gaze to the right.  Symptoms lasted about 45 minutes and were resolved before he was seen in the emergency room.  There was no headache.  No history of migraines.  MRI of brain was noncontributory.   Review of Systems  Constitutional: Negative.   HENT:  Positive for hearing loss. Negative for ear pain.   Eyes:  Negative for blurred vision, discharge and redness.  Respiratory: Negative.    Cardiovascular: Negative.   Gastrointestinal:  Negative for abdominal pain.  Genitourinary: Negative.   Musculoskeletal: Negative.  Negative for myalgias.  Skin:  Negative for rash.  Neurological:  Negative for tingling, loss of consciousness and weakness.  Endo/Heme/Allergies:  Negative for polydipsia.     Current Outpatient Medications:    aspirin  EC 81 MG tablet, Take 1 tablet (81 mg total) by mouth daily. Swallow whole., Disp: , Rfl:    atorvastatin  (LIPITOR) 20 MG tablet, TAKE 1 TABLET BY MOUTH EVERY DAY, Disp: 90 tablet, Rfl: 1   augmented betamethasone dipropionate (DIPROLENE-AF) 0.05 % cream, Apply 1 Application topically daily as needed (itching/rash.)., Disp: , Rfl:    carbamide peroxide (DEBROX) 6.5 % OTIC solution, Place 5 drops into both ears 2 (two) times daily., Disp: 15 mL, Rfl: 0   clopidogrel  (PLAVIX ) 75 MG tablet, Take 1 tablet (75 mg total) by mouth daily for 20 days., Disp: 20 tablet, Rfl: 0   cyanocobalamin  (VITAMIN B12) 1000 MCG tablet, Take 1  tablet (1,000 mcg total) by mouth daily., Disp: 90 tablet, Rfl: 2   empagliflozin  (JARDIANCE ) 25 MG TABS tablet, Take 1 tablet (25 mg total) by mouth daily before breakfast., Disp: 90 tablet, Rfl: 1   finasteride  (PROSCAR ) 5 MG tablet, TAKE 1 TABLET (5 MG TOTAL) BY MOUTH DAILY., Disp: 90 tablet, Rfl: 1   Multiple Vitamin (MULTIVITAMIN WITH MINERALS) TABS tablet, Take 1 tablet by mouth in the morning. One A Day, Disp: , Rfl:    Olopatadine  HCl (PATADAY ) 0.2 % SOLN, Place 1 drop into both eyes 1 day or 1 dose., Disp: 2.5 mL, Rfl: 5   tamsulosin  (FLOMAX ) 0.4 MG CAPS capsule, Take 1 capsule (0.4 mg total) by mouth daily., Disp: 30 capsule, Rfl: 5   Objective:     BP 116/68 (BP Location: Left Arm, Patient Position: Sitting, Cuff Size: Normal)   Pulse 70   Temp 97.6 F (36.4 C) (Temporal)   Ht 5' 10 (1.778 m)   Wt 200 lb 12.8 oz (91.1 kg)   SpO2 95%   BMI 28.81 kg/m    Physical Exam Constitutional:      General: He is not in acute distress.    Appearance: Normal appearance. He is not ill-appearing, toxic-appearing or diaphoretic.  HENT:     Head: Normocephalic and atraumatic.     Right Ear: External ear normal. There is impacted cerumen.     Left Ear:  External ear normal. There is impacted cerumen.     Mouth/Throat:   Eyes:     General: No scleral icterus.       Right eye: No discharge.        Left eye: No discharge.     Extraocular Movements: Extraocular movements intact.     Conjunctiva/sclera: Conjunctivae normal.     Pupils: Pupils are equal, round, and reactive to light.  Pulmonary:     Effort: Pulmonary effort is normal.  Skin:    General: Skin is warm and dry.  Neurological:     Mental Status: He is alert and oriented to person, place, and time.  Psychiatric:        Mood and Affect: Mood normal.        Behavior: Behavior normal.      No results found for any visits on 06/25/24.    The ASCVD Risk score (Arnett DK, et al., 2019) failed to calculate for the  following reasons:   The 2019 ASCVD risk score is only valid for ages 89 to 28    Assessment & Plan:   Hospital discharge follow-up  Immunization due -     Flu vaccine HIGH DOSE PF(Fluzone Trivalent)  History of diplopia -     Ambulatory referral to Ophthalmology  Bilateral impacted cerumen -     Ambulatory referral to ENT    Return Should have follow-up appointment at the end of November.  Information was given on ceruminosis and ear irrigation.  Advised patient not to use Q-tips  Elsie Sim Lent, MD

## 2024-06-26 ENCOUNTER — Other Ambulatory Visit (HOSPITAL_COMMUNITY): Payer: Self-pay

## 2024-06-27 ENCOUNTER — Other Ambulatory Visit (HOSPITAL_COMMUNITY): Payer: Self-pay

## 2024-07-11 ENCOUNTER — Ambulatory Visit (INDEPENDENT_AMBULATORY_CARE_PROVIDER_SITE_OTHER): Admitting: Physician Assistant

## 2024-07-11 ENCOUNTER — Encounter (INDEPENDENT_AMBULATORY_CARE_PROVIDER_SITE_OTHER): Payer: Self-pay | Admitting: Physician Assistant

## 2024-07-11 VITALS — BP 137/77 | HR 64 | Temp 97.8°F | Ht 70.0 in | Wt 196.0 lb

## 2024-07-11 DIAGNOSIS — H6123 Impacted cerumen, bilateral: Secondary | ICD-10-CM

## 2024-07-11 NOTE — Progress Notes (Signed)
 Dear Dr. Berneta, Here is my assessment for our mutual patient, Gerald Shelton. Thank you for allowing me the opportunity to care for your patient. Please do not hesitate to contact me should you have any other questions. Sincerely, Chyrl Cohen PA-C  Otolaryngology Clinic Note Referring provider: Dr. Berneta HPI:  Gerald Shelton is a 83 y.o. male kindly referred by Dr. Berneta   The patient is a 83 year old gentleman seen in our office for evaluation of cerumen impaction.  The patient notes that he has a longstanding history of decreased hearing he wears over-the-counter hearing aids which significantly helps his hearing.  He notes recently had decreased hearing predominantly on the left.  He was seen and evaluated urgent care, they attempted cleaning out the cerumen but this was unsuccessful.  He denies associated pain, no trauma to the ears.   Independent Review of Additional Tests or Records:  PCP evaluation 06/25/2024- cerumen impaction    PMH/Meds/All/SocHx/FamHx/ROS:   Past Medical History:  Diagnosis Date   A-fib (HCC) 10/25/2016   per pt not afib PAC's   Anxiety attack    Atrial premature complexes    Cancer (HCC)    SKIN, ON FACE   Chest pain 10/25/2016   ED (erectile dysfunction)    GERD (gastroesophageal reflux disease)    Hay fever    Hyperlipidemia    Irregular heartbeat    PUD (peptic ulcer disease)    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     Past Surgical History:  Procedure Laterality Date   BRONCHIAL WASHINGS  07/28/2022   Procedure: BRONCHIAL WASHINGS;  Surgeon: Annella Donnice SAUNDERS, MD;  Location: [facility];  Service: Endoscopy;;   EYE SURGERY     cataract removed form right eye   HERNIA REPAIR     x2   NASAL SEPTUM SURGERY     VIDEO BRONCHOSCOPY N/A 07/28/2022   Procedure: VIDEO BRONCHOSCOPY WITHOUT FLUORO;  Surgeon: Annella Donnice SAUNDERS, MD;  Location: [facility];  Service: Endoscopy;  Laterality: N/A;    Family History  Problem Relation  Age of Onset   Diabetes Son    Parkinson's disease Mother    Depression Mother    Dementia Mother      Social Connections: Moderately Integrated (06/18/2024)   Social Connection and Isolation Panel    Frequency of Communication with Friends and Family: Twice a week    Frequency of Social Gatherings with Friends and Family: Once a week    Attends Religious Services: Never    Database administrator or Organizations: Yes    Attends Engineer, structural: More than 4 times per year    Marital Status: Married      Current Outpatient Medications:    aspirin  EC 81 MG tablet, Take 1 tablet (81 mg total) by mouth daily. Swallow whole., Disp: , Rfl:    atorvastatin  (LIPITOR) 20 MG tablet, TAKE 1 TABLET BY MOUTH EVERY DAY, Disp: 90 tablet, Rfl: 1   augmented betamethasone dipropionate (DIPROLENE-AF) 0.05 % cream, Apply 1 Application topically daily as needed (itching/rash.)., Disp: , Rfl:    carbamide peroxide (DEBROX) 6.5 % OTIC solution, Place 5 drops into both ears 2 (two) times daily., Disp: 15 mL, Rfl: 0   cyanocobalamin  (VITAMIN B12) 1000 MCG tablet, Take 1 tablet (1,000 mcg total) by mouth daily., Disp: 90 tablet, Rfl: 2   empagliflozin  (JARDIANCE ) 25 MG TABS tablet, Take 1 tablet (25 mg total) by mouth daily before breakfast., Disp: 90 tablet, Rfl: 1  finasteride  (PROSCAR ) 5 MG tablet, TAKE 1 TABLET (5 MG TOTAL) BY MOUTH DAILY., Disp: 90 tablet, Rfl: 1   Multiple Vitamin (MULTIVITAMIN WITH MINERALS) TABS tablet, Take 1 tablet by mouth in the morning. One A Day, Disp: , Rfl:    Olopatadine  HCl (PATADAY ) 0.2 % SOLN, Place 1 drop into both eyes 1 day or 1 dose., Disp: 2.5 mL, Rfl: 5   tamsulosin  (FLOMAX ) 0.4 MG CAPS capsule, Take 1 capsule (0.4 mg total) by mouth daily., Disp: 30 capsule, Rfl: 5   Physical Exam:   BP 137/77 (BP Location: Right Arm, Patient Position: Sitting, Cuff Size: Normal)   Pulse 64   Temp 97.8 F (36.6 C) (Oral)   Ht 5' 10 (1.778 m)   Wt 196 lb (88.9 kg)    SpO2 92%   BMI 28.12 kg/m   Pertinent Findings  CN II-XII intact Lateral cerumen impaction Anterior rhinoscopy: Septum midline right deviation; bilateral inferior turbinates with mild hypertrophy No lesions of oral cavity/oropharynx; dentition within normal limits No obviously palpable neck masses/lymphadenopathy/thyromegaly No respiratory distress or stridor   Seprately Identifiable Procedures:  Procedure: Bilateral ear microscopy and cerumen removal using microscope (CPT [phone]) - Mod 50 Pre-procedure diagnosis: bilateral cerumen impaction external auditory canals Post-procedure diagnosis: same Indication: bilateral cerumen impaction; given patient's otologic complaints and history as well as for improved and comprehensive examination of external ear and tympanic membrane, bilateral otologic examination using microscope was performed and impacted cerumen removed  Procedure: Patient was placed semi-recumbent. Both ear canals were examined using the microscope with findings above. Cerumen removed from bilateral external auditory canals using suction and currette with improvement in EAC examination and patency. Left: EAC was patent. TM was intact . Middle ear was aerated. Drainage: none Right: EAC was patent. TM was intact . Middle ear was aerated . Drainage: none Patient tolerated the procedure well.   Impression & Plans:  Gerald Shelton is a 83 y.o. male with the following   Cerumen impaction-  The patient presented today with cerumen impaction.  This was removed without difficulty.  I see no signs of infection.  The patient will reach out to the office if he develops any new or worsening signs or symptoms.  He does not feel she has any significant change to her baseline hearing and did not elect for audiology evaluation today.    - f/u PRN   Thank you for allowing me the opportunity to care for your patient. Please do not hesitate to contact me should you have any other  questions.  Sincerely, Chyrl Cohen PA-C [facility] Phone: [phone] Fax: [phone]  07/11/2024, 10:01 AM

## 2024-07-21 ENCOUNTER — Other Ambulatory Visit: Payer: Self-pay | Admitting: Family Medicine

## 2024-07-21 DIAGNOSIS — E782 Mixed hyperlipidemia: Secondary | ICD-10-CM

## 2024-07-23 ENCOUNTER — Other Ambulatory Visit: Payer: Self-pay | Admitting: Family Medicine

## 2024-07-23 DIAGNOSIS — N401 Enlarged prostate with lower urinary tract symptoms: Secondary | ICD-10-CM

## 2024-08-13 ENCOUNTER — Ambulatory Visit (INDEPENDENT_AMBULATORY_CARE_PROVIDER_SITE_OTHER): Admitting: Family Medicine

## 2024-08-13 ENCOUNTER — Encounter: Payer: Self-pay | Admitting: Family Medicine

## 2024-08-13 VITALS — BP 110/68 | HR 70 | Temp 98.2°F | Ht 70.0 in | Wt 204.0 lb

## 2024-08-13 DIAGNOSIS — R7303 Prediabetes: Secondary | ICD-10-CM | POA: Diagnosis not present

## 2024-08-13 DIAGNOSIS — E538 Deficiency of other specified B group vitamins: Secondary | ICD-10-CM | POA: Diagnosis not present

## 2024-08-13 DIAGNOSIS — H539 Unspecified visual disturbance: Secondary | ICD-10-CM

## 2024-08-13 DIAGNOSIS — E782 Mixed hyperlipidemia: Secondary | ICD-10-CM | POA: Diagnosis not present

## 2024-08-13 DIAGNOSIS — G629 Polyneuropathy, unspecified: Secondary | ICD-10-CM | POA: Diagnosis not present

## 2024-08-13 NOTE — Progress Notes (Signed)
 Established Patient Office Visit   Subjective:  Patient ID: Gerald Shelton, male    DOB: Dec 19, 1940  Age: 83 y.o. MRN: [id number]  Chief Complaint  Patient presents with   Medical Management of Chronic Issues    3 month follow up. Pt is fasting. Pt wants to know if he should continue 81mg  Aspirin . Had TIA 1 month ago. Pt wants lipd and glucose checked today. Last A1c was less than 90 days ago. Prevnar vaccine today.    Peripheral Neuropathy    Pt complains for nueropathy of feet and ankles x years. Wants to know if there is anything that he can take or can be done for the nerve pain.     HPI Encounter Diagnoses  Name Primary?   Mixed hyperlipidemia Yes   Prediabetes    B12 deficiency    Neuropathy    Transient visual disturbance    For follow-up of above.  Doing okay.  No more visual changes.  Completed therapy with Plavix  and continues enteric-coated baby aspirin .  No issues taking it.  Denies indigestion melena or bloody stools.  Ongoing neuropathy involving both of his feet over the last 20 years.  It has been stable.  Denies significant pain or burning.   Review of Systems  Constitutional: Negative.   HENT: Negative.    Eyes:  Negative for blurred vision, discharge and redness.  Respiratory: Negative.    Cardiovascular: Negative.   Gastrointestinal:  Negative for abdominal pain.  Genitourinary: Negative.   Musculoskeletal: Negative.  Negative for myalgias.  Skin:  Negative for rash.  Neurological:  Negative for tingling, loss of consciousness and weakness.  Endo/Heme/Allergies:  Negative for polydipsia.     Current Outpatient Medications:    aspirin  EC 81 MG tablet, Take 1 tablet (81 mg total) by mouth daily. Swallow whole., Disp: , Rfl:    atorvastatin  (LIPITOR) 20 MG tablet, TAKE 1 TABLET BY MOUTH EVERY DAY, Disp: 90 tablet, Rfl: 1   augmented betamethasone dipropionate (DIPROLENE-AF) 0.05 % cream, Apply 1 Application topically daily as needed (itching/rash.)., Disp: ,  Rfl:    cyanocobalamin  (VITAMIN B12) 1000 MCG tablet, Take 1 tablet (1,000 mcg total) by mouth daily., Disp: 90 tablet, Rfl: 2   empagliflozin  (JARDIANCE ) 25 MG TABS tablet, Take 1 tablet (25 mg total) by mouth daily before breakfast., Disp: 90 tablet, Rfl: 1   finasteride  (PROSCAR ) 5 MG tablet, TAKE 1 TABLET (5 MG TOTAL) BY MOUTH DAILY., Disp: 90 tablet, Rfl: 1   Multiple Vitamin (MULTIVITAMIN WITH MINERALS) TABS tablet, Take 1 tablet by mouth in the morning. One A Day, Disp: , Rfl:    Olopatadine  HCl (PATADAY ) 0.2 % SOLN, Place 1 drop into both eyes 1 day or 1 dose., Disp: 2.5 mL, Rfl: 5   tamsulosin  (FLOMAX ) 0.4 MG CAPS capsule, Take 1 capsule (0.4 mg total) by mouth daily., Disp: 30 capsule, Rfl: 5   carbamide peroxide (DEBROX) 6.5 % OTIC solution, Place 5 drops into both ears 2 (two) times daily., Disp: 15 mL, Rfl: 0   Objective:     BP 110/68 (BP Location: Left Arm, Patient Position: Sitting, Cuff Size: Normal)   Pulse 70   Temp 98.2 F (36.8 C) (Temporal)   Ht 5' 10 (1.778 m)   Wt 204 lb (92.5 kg)   SpO2 94%   BMI 29.27 kg/m    Physical Exam Constitutional:      General: He is not in acute distress.    Appearance: Normal appearance. He is not  ill-appearing, toxic-appearing or diaphoretic.  HENT:     Head: Normocephalic and atraumatic.     Right Ear: External ear normal.     Left Ear: External ear normal.  Eyes:     General: No scleral icterus.       Right eye: No discharge.        Left eye: No discharge.     Extraocular Movements: Extraocular movements intact.     Conjunctiva/sclera: Conjunctivae normal.  Pulmonary:     Effort: Pulmonary effort is normal. No respiratory distress.  Skin:    General: Skin is warm and dry.  Neurological:     Mental Status: He is alert and oriented to person, place, and time.  Psychiatric:        Mood and Affect: Mood normal.        Behavior: Behavior normal.      No results found for any visits on 08/13/24.    The ASCVD Risk  score (Arnett DK, et al., 2019) failed to calculate for the following reasons:   The 2019 ASCVD risk score is only valid for ages 12 to 25    Assessment & Plan:   Mixed hyperlipidemia -     Pneumococcal conjugate vaccine 20-valent  Prediabetes  B12 deficiency -     Vitamin B12  Neuropathy -     Vitamin B12 -     TSH  Transient visual disturbance    Return in about 3 months (around 11/13/2024).  Continue daily 81 mg enteric-coated aspirin .  Elsie Sim Lent, MD

## 2024-08-14 LAB — VITAMIN B12: Vitamin B-12: 582 pg/mL (ref 211–911)

## 2024-08-14 LAB — TSH: TSH: 1.51 u[IU]/mL (ref 0.35–5.50)

## 2024-08-15 ENCOUNTER — Ambulatory Visit: Payer: Self-pay | Admitting: Family Medicine

## 2024-10-21 ENCOUNTER — Ambulatory Visit (INDEPENDENT_AMBULATORY_CARE_PROVIDER_SITE_OTHER): Admitting: Family Medicine

## 2024-10-21 ENCOUNTER — Encounter: Payer: Self-pay | Admitting: Family Medicine

## 2024-10-21 VITALS — BP 134/70 | HR 74 | Temp 97.3°F | Ht 70.0 in | Wt 199.4 lb

## 2024-10-21 DIAGNOSIS — J849 Interstitial pulmonary disease, unspecified: Secondary | ICD-10-CM

## 2024-10-21 DIAGNOSIS — J40 Bronchitis, not specified as acute or chronic: Secondary | ICD-10-CM

## 2024-10-21 MED ORDER — METHYLPREDNISOLONE 4 MG PO TBPK: ORAL_TABLET | ORAL | 0 refills | Status: AC

## 2024-10-21 NOTE — Progress Notes (Signed)
 " [facility] [address] [place] [address] Dept: [phone] Dept Fax: [phone]  Office Visit  Subjective:    Patient ID: Gerald Shelton, male    DOB: [birth year], 84 y.o..   MRN: [id number]  Chief Complaint  Patient presents with   Cough    C/o having SOB, and coughing up phlegm x 2 weeks.      History of Present Illness:  Patient is in today complaining of recent issues with dyspnea on exertion. Gerald Shelton has a history of interstitial lung disease, diagnosed in 2024.  During his initial episode, he had significant hypoxia and ended up on oxygen. However, he has been off of this for some time. He notes that 2 weeks ago, he developed an acute cough productive of white phlegm. Since that time, he finds when he exerts himself, his feels short of breath and has found his oxygen level to be in the 50-50% range. It fairly quickly will return to the 90s with rest.  Past Medical History: Patient Active Problem List   Diagnosis Date Noted   TIA (transient ischemic attack) 06/18/2024   Transient visual disturbance 06/17/2024   Benign prostatic hyperplasia with nocturia 01/31/2023   Elevated glucose 11/02/2022   Malaise and fatigue 07/11/2022   Hypoxia 07/05/2022   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HCC) 07/04/2022   Acute respiratory failure with hypoxia (HCC) 07/03/2022   Memory difficulty 03/31/2022   Seasonal allergic rhinitis due to pollen 03/31/2022   Excessive cerumen in left ear canal 03/31/2022   Muscle cramps 09/14/2021   Sinus pressure 08/09/2021   Generalized headaches 08/09/2021   B12 deficiency 03/24/2020   Neuropathy 03/19/2020   Plantar fasciitis, bilateral 03/19/2020   Medicare annual wellness visit, subsequent 10/16/2018   APC (atrial premature contractions) 08/14/2018   Gastroesophageal reflux disease without esophagitis 08/14/2018   Mixed hyperlipidemia 08/14/2018   GAD (generalized anxiety disorder) 08/14/2018    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   Past Surgical History:  Procedure Laterality Date   BRONCHIAL WASHINGS  07/28/2022   Procedure: BRONCHIAL WASHINGS;  Surgeon: Annella Donnice SAUNDERS, MD;  Location: [facility];  Service: Endoscopy;;   EYE SURGERY     cataract removed form right eye   HERNIA REPAIR  2010 & 2015 ?????   x2   NASAL SEPTUM SURGERY     VIDEO BRONCHOSCOPY N/A 07/28/2022   Procedure: VIDEO BRONCHOSCOPY WITHOUT FLUORO;  Surgeon: Annella Donnice SAUNDERS, MD;  Location: [facility];  Service: Endoscopy;  Laterality: N/A;   Family History  Problem Relation Age of Onset   Diabetes Son    Parkinson's disease Mother    Depression Mother    Dementia Mother    Hearing loss Mother    Varicose Veins Mother    Outpatient Medications Prior to Visit  Medication Sig Dispense Refill   aspirin  EC 81 MG tablet Take 1 tablet (81 mg total) by mouth daily. Swallow whole.     atorvastatin  (LIPITOR) 20 MG tablet TAKE 1 TABLET BY MOUTH EVERY DAY 90 tablet 1   augmented betamethasone dipropionate (DIPROLENE-AF) 0.05 % cream Apply 1 Application topically daily as needed (itching/rash.).     cyanocobalamin  (VITAMIN B12) 1000 MCG tablet Take 1 tablet (1,000 mcg total) by mouth daily. 90 tablet 2   empagliflozin  (JARDIANCE ) 25 MG TABS tablet Take 1 tablet (25 mg total) by mouth daily before breakfast. 90 tablet 1   finasteride  (PROSCAR ) 5 MG tablet TAKE 1 TABLET (5 MG TOTAL) BY  MOUTH DAILY. 90 tablet 1   Multiple Vitamin (MULTIVITAMIN WITH MINERALS) TABS tablet Take 1 tablet by mouth in the morning. One A Day     Olopatadine  HCl (PATADAY ) 0.2 % SOLN Place 1 drop into both eyes 1 day or 1 dose. 2.5 mL 5   tamsulosin  (FLOMAX ) 0.4 MG CAPS capsule Take 1 capsule (0.4 mg total) by mouth daily. 30 capsule 5   No facility-administered medications prior to visit.   Allergies[1]   Objective:   Today's Vitals   10/21/24 1556  BP: 134/70  Pulse: 74  Temp: (!) 97.3 F (36.3 C)   TempSrc: Temporal  SpO2: 95%  Weight: 199 lb 6.4 oz (90.4 kg)  Height: 5' 10 (1.778 m)   Body mass index is 28.61 kg/m.   General: Well developed, well nourished. No acute distress. Psych: Alert and oriented. Normal mood and affect.  I had Gerald Shelton walk a loop in the hallways in our office. Afterwards his O2 sat was 89%.  Health Maintenance Due  Topic Date Due   DTaP/Tdap/Td (2 - Td or Tdap) 02/01/2024   Medicare Annual Wellness (AWV)  02/24/2024     Assessment & Plan:  1. Bronchitis (Primary) 2. Interstitial lung disease [facility]) Gerald Shelton appears to have had a recent viral infection that exacerbated his underlying ILD. I will treat him with a course of steroids. If he has good improvement, he should follow-up as scheduled with his pulmonologist. If his symptoms are not improving or worsen, he should follow-up sooner with Dr. Berneta or myself.  - methylPREDNISolone  (MEDROL  DOSEPAK) 4 MG TBPK tablet; Take per package instructions.  Dispense: 21 tablet; Refill: 0   Return if symptoms worsen or fail to improve.   Garnette CHRISTELLA Simpler, MD    [1]  Allergies Allergen Reactions   Augmentin [Amoxicillin-Pot Clavulanate] Other (See Comments)    GI Upset   Metformin  And Related Other (See Comments)    Cramps with loose stool    Zocor [Simvastatin] Other (See Comments)    GI Upset   "

## 2024-10-31 ENCOUNTER — Telehealth: Payer: Self-pay

## 2024-10-31 DIAGNOSIS — E538 Deficiency of other specified B group vitamins: Secondary | ICD-10-CM

## 2024-10-31 NOTE — Telephone Encounter (Unsigned)
 Copied from CRM #[id number]. Topic: Clinical - Medication Refill >> Oct 30, 2024  3:07 PM Berneda FALCON wrote: Medication:  1-a-day mens 50+  Patient states that this is now covered by insurance and pharmacy needs prescription to fill it for him.   Has the patient contacted their pharmacy? Yes (Agent: If no, request that the patient contact the pharmacy for the refill. If patient does not wish to contact the pharmacy document the reason why and proceed with request.) (Agent: If yes, when and what did the pharmacy advise?)  This is the patient's preferred pharmacy:  [id number] [place], [place] - [address] [place] [address] Phone: [phone] Fax: [phone]  Is this the correct pharmacy for this prescription? Yes If no, delete pharmacy and type the correct one.   Has the prescription been filled recently? No  Is the patient out of the medication? Yes  Has the patient been seen for an appointment in the last year OR does the patient have an upcoming appointment? Yes  Can we respond through MyChart? Yes  Agent: Please be advised that Rx refills may take up to 3 business days. We ask that you follow-up with your pharmacy.

## 2024-11-01 MED ORDER — ONE-A-DAY MENS 50+ PO TABS: 1.0000 | ORAL_TABLET | Freq: Every day | ORAL | 2 refills | Status: AC

## 2024-11-09 ENCOUNTER — Other Ambulatory Visit: Payer: Self-pay | Admitting: Family Medicine

## 2024-11-09 DIAGNOSIS — R7303 Prediabetes: Secondary | ICD-10-CM

## 2024-11-09 DIAGNOSIS — N401 Enlarged prostate with lower urinary tract symptoms: Secondary | ICD-10-CM

## 2024-11-12 ENCOUNTER — Ambulatory Visit: Admitting: Family Medicine

## 2024-11-12 ENCOUNTER — Telehealth: Payer: Self-pay

## 2024-11-12 NOTE — Telephone Encounter (Signed)
 Copied from CRM [id number]. Topic: Clinical - Prescription Issue >> Nov 11, 2024  3:53 PM Nessti S wrote: Reason for CRM: pt called because he hasn't received Multiple Vitamins-Minerals (ONE-A-DAY MENS 50+) TABS. Adv that med was approved and sent to preferred pharmacy. He stated he would check with pharmacy

## 2024-11-12 NOTE — Telephone Encounter (Signed)
 Contacted patient pharmacy to confirm if rx has been received. Pharmacy tech states the Rx had not been filled because patient insurance doesn't cover OTC meds. Contacted Patient states Rx has not been received, informed pt rx will not be filled due to insurance not covering OTC medications

## 2024-11-26 ENCOUNTER — Ambulatory Visit: Admitting: Family Medicine

## 2024-11-26 ENCOUNTER — Ambulatory Visit

## 2025-01-15 ENCOUNTER — Encounter: Admitting: Family Medicine
